# Patient Record
Sex: Female | Born: 1975 | Race: Black or African American | Hispanic: No | Marital: Married | Smoking: Former smoker
Health system: Southern US, Community
[De-identification: ages and names within clinical notes are randomized; demographics above are authoritative.]

## PROBLEM LIST (undated history)

## (undated) DIAGNOSIS — M62838 Other muscle spasm: Secondary | ICD-10-CM

## (undated) DIAGNOSIS — R0600 Dyspnea, unspecified: Secondary | ICD-10-CM

## (undated) DIAGNOSIS — R519 Headache, unspecified: Secondary | ICD-10-CM

## (undated) DIAGNOSIS — F329 Major depressive disorder, single episode, unspecified: Secondary | ICD-10-CM

## (undated) DIAGNOSIS — D649 Anemia, unspecified: Secondary | ICD-10-CM

## (undated) DIAGNOSIS — I10 Essential (primary) hypertension: Secondary | ICD-10-CM

## (undated) DIAGNOSIS — R55 Syncope and collapse: Secondary | ICD-10-CM

## (undated) DIAGNOSIS — F32A Depression, unspecified: Secondary | ICD-10-CM

## (undated) DIAGNOSIS — M1990 Unspecified osteoarthritis, unspecified site: Secondary | ICD-10-CM

## (undated) DIAGNOSIS — F419 Anxiety disorder, unspecified: Secondary | ICD-10-CM

## (undated) HISTORY — PX: CHOLECYSTECTOMY: SHX55

## (undated) HISTORY — PX: BREAST EXCISIONAL BIOPSY: SUR124

## (undated) HISTORY — DX: Anxiety disorder, unspecified: F41.9

## (undated) HISTORY — DX: Other muscle spasm: M62.838

## (undated) HISTORY — PX: TUBAL LIGATION: SHX77

## (undated) HISTORY — DX: Syncope and collapse: R55

## (undated) HISTORY — DX: Major depressive disorder, single episode, unspecified: F32.9

## (undated) HISTORY — DX: Depression, unspecified: F32.A

---

## 2015-03-17 ENCOUNTER — Encounter (HOSPITAL_COMMUNITY): Payer: Self-pay | Admitting: *Deleted

## 2015-03-17 DIAGNOSIS — R002 Palpitations: Secondary | ICD-10-CM | POA: Insufficient documentation

## 2015-03-17 DIAGNOSIS — Z720 Tobacco use: Secondary | ICD-10-CM | POA: Insufficient documentation

## 2015-03-17 DIAGNOSIS — H539 Unspecified visual disturbance: Secondary | ICD-10-CM | POA: Insufficient documentation

## 2015-03-17 DIAGNOSIS — R55 Syncope and collapse: Secondary | ICD-10-CM | POA: Insufficient documentation

## 2015-03-17 DIAGNOSIS — R000 Tachycardia, unspecified: Secondary | ICD-10-CM | POA: Insufficient documentation

## 2015-03-17 DIAGNOSIS — I10 Essential (primary) hypertension: Secondary | ICD-10-CM | POA: Insufficient documentation

## 2015-03-17 DIAGNOSIS — R531 Weakness: Secondary | ICD-10-CM | POA: Insufficient documentation

## 2015-03-17 NOTE — ED Notes (Signed)
lmp last month 

## 2015-03-17 NOTE — ED Notes (Signed)
The pt is c/o being dizzy since 2000 tonight c/o weakness and FALLING OUT.  bp elevated.  On meds years ago none now

## 2015-03-18 ENCOUNTER — Emergency Department (HOSPITAL_COMMUNITY)
Admission: EM | Admit: 2015-03-18 | Discharge: 2015-03-18 | Disposition: A | Discharge status: Home / Self Care | Payer: Self-pay | Attending: Emergency Medicine | Admitting: Emergency Medicine

## 2015-03-18 ENCOUNTER — Encounter (HOSPITAL_COMMUNITY): Payer: Self-pay | Admitting: Emergency Medicine

## 2015-03-18 DIAGNOSIS — R55 Syncope and collapse: Secondary | ICD-10-CM

## 2015-03-18 HISTORY — DX: Essential (primary) hypertension: I10

## 2015-03-18 LAB — LIPASE, BLOOD: LIPASE: 24 U/L (ref 22–51)

## 2015-03-18 LAB — COMPREHENSIVE METABOLIC PANEL
ALT: 15 U/L (ref 14–54)
AST: 16 U/L (ref 15–41)
Albumin: 3.8 g/dL (ref 3.5–5.0)
Alkaline Phosphatase: 45 U/L (ref 38–126)
Anion gap: 12 (ref 5–15)
BILIRUBIN TOTAL: 0.4 mg/dL (ref 0.3–1.2)
BUN: 7 mg/dL (ref 6–20)
CHLORIDE: 103 mmol/L (ref 101–111)
CO2: 22 mmol/L (ref 22–32)
CREATININE: 0.77 mg/dL (ref 0.44–1.00)
Calcium: 9.3 mg/dL (ref 8.9–10.3)
GFR calc Af Amer: >60 mL/min (ref >60)
Glucose, Bld: 116 mg/dL — ABNORMAL HIGH (ref 70–99)
Potassium: 3.8 mmol/L (ref 3.5–5.1)
Sodium: 137 mmol/L (ref 135–145)
Total Protein: 7.4 g/dL (ref 6.5–8.1)

## 2015-03-18 LAB — CBC WITH DIFFERENTIAL/PLATELET
BASOS PCT: 0 % (ref 0–1)
Basophils Absolute: 0 10*3/uL (ref 0.0–0.1)
Eosinophils Absolute: 0.2 10*3/uL (ref 0.0–0.7)
Eosinophils Relative: 3 % (ref 0–5)
HEMATOCRIT: 36.2 % (ref 36.0–46.0)
Hemoglobin: 12 g/dL (ref 12.0–15.0)
LYMPHS PCT: 35 % (ref 12–46)
Lymphs Abs: 3.2 10*3/uL (ref 0.7–4.0)
MCH: 26.8 pg (ref 26.0–34.0)
MCHC: 33.1 g/dL (ref 30.0–36.0)
MCV: 80.8 fL (ref 78.0–100.0)
MONO ABS: 0.7 10*3/uL (ref 0.1–1.0)
MONOS PCT: 8 % (ref 3–12)
Neutro Abs: 4.9 10*3/uL (ref 1.7–7.7)
Neutrophils Relative %: 54 % (ref 43–77)
Platelets: 362 10*3/uL (ref 150–400)
RBC: 4.48 MIL/uL (ref 3.87–5.11)
RDW: 16 % — ABNORMAL HIGH (ref 11.5–15.5)
WBC: 9.1 10*3/uL (ref 4.0–10.5)

## 2015-03-18 LAB — I-STAT CG4 LACTIC ACID, ED: Lactic Acid, Venous: 0.71 mmol/L (ref 0.5–2.0)

## 2015-03-18 LAB — TROPONIN I: Troponin I: <0.03 ng/mL (ref <0.031)

## 2015-03-18 NOTE — ED Notes (Signed)
Explained to patient urine sample is still needed.

## 2015-03-18 NOTE — ED Notes (Signed)
Pt. Unable to void still.

## 2015-03-18 NOTE — ED Provider Notes (Signed)
CSN: [id number]     Arrival date & time 03/17/15  2300 History  This chart was scribed for Dione Boozeavid Jakeb Lamping, MD by Abel PrestoKara Demonbreun, ED Scribe. This patient was seen in room A04C/A04C and the patient's care was started at 1:27 AM.      Chief Complaint  Patient presents with  . Dizziness     Patient is a 39 y.o. female presenting with dizziness. The history is provided by the patient. No language interpreter was used.  Dizziness Associated symptoms: palpitations and weakness   Associated symptoms: no chest pain, no nausea and no shortness of breath    HPI Comments: Morgan SkinnerCristalh Mcnear is a 39 y.o. female with PMHx of HTN who presents to the Emergency Department complaining of near syncopal episode with onset his evening around 9:45 PM. She states she standing in kitchen making a sandwich and began to feel dizzy and her vision blurred. She states "I just fell out."  She notes dizziness resolved around 12:30 AM. Pt states she did not begin to feel at baseline until arrival to ED. Pt notes associated palpitations and weakness in legs. Pt denies nausea, chest pain, diaphoresis, and SOB.   Past Medical History  Diagnosis Date  . Hypertension    Past Surgical History  Procedure Laterality Date  . Cholecystectomy    . Tubal ligation     No family history on file. History  Substance Use Topics  . Smoking status: Current Every Day Smoker  . Smokeless tobacco: Not on file  . Alcohol Use: No   OB History    No data available     Review of Systems  Constitutional: Negative for diaphoresis.  Eyes: Positive for visual disturbance.  Respiratory: Negative for shortness of breath.   Cardiovascular: Positive for palpitations. Negative for chest pain.  Gastrointestinal: Negative for nausea.  Neurological: Positive for dizziness and weakness. Negative for syncope.  All other systems reviewed and are negative.     Allergies  Review of patient's allergies indicates no known allergies.  Home  Medications   Prior to Admission medications   Medication Sig Start Date End Date Taking? Authorizing Provider  ibuprofen (ADVIL,MOTRIN) 100 MG tablet Take 600 mg by mouth every 6 (six) hours as needed for fever.   Yes Historical Provider, MD   BP 112/75 mmHg  Pulse 87  Temp(Src) 98.3 F (36.8 C) (Oral)  Resp 18  SpO2 100%  LMP 03/05/2015 Physical Exam  Constitutional: She is oriented to person, place, and time. She appears well-developed and well-nourished.  HENT:  Head: Normocephalic and atraumatic.  Eyes: Conjunctivae are normal. Pupils are equal, round, and reactive to light.  Neck: Normal range of motion. Neck supple. No JVD present.  Cardiovascular: Normal rate, regular rhythm and normal heart sounds.  Exam reveals no friction rub.   No murmur heard. Pulmonary/Chest: Effort normal and breath sounds normal. She has no wheezes. She has no rales. She exhibits no tenderness.  Abdominal: Soft. Bowel sounds are normal. She exhibits no distension and no mass. There is no tenderness.  Musculoskeletal: Normal range of motion. She exhibits no edema.  Lymphadenopathy:    She has no cervical adenopathy.  Neurological: She is alert and oriented to person, place, and time. No cranial nerve deficit. She exhibits normal muscle tone. Coordination normal.  Skin: Skin is warm and dry. No rash noted.  Psychiatric: She has a normal mood and affect. Her behavior is normal. Judgment and thought content normal.  Nursing note and vitals reviewed.  ED Course  Procedures (including critical care time) DIAGNOSTIC STUDIES: Oxygen Saturation is 100% on room air, normal by my interpretation.    COORDINATION OF CARE: 1:34 AM Discussed treatment plan with patient at beside, the patient agrees with the plan and has no further questions at this time.   Labs Review Results for orders placed or performed during the hospital encounter of 03/18/15  CBC with Differential  Result Value Ref Range   WBC 9.1  4.0 - 10.5 K/uL   RBC 4.48 3.87 - 5.11 MIL/uL   Hemoglobin 12.0 12.0 - 15.0 g/dL   HCT 16.136.2 09.636.0 - 04.546.0 %   MCV 80.8 78.0 - 100.0 fL   MCH 26.8 26.0 - 34.0 pg   MCHC 33.1 30.0 - 36.0 g/dL   RDW 40.916.0 (H) 81.111.5 - 91.415.5 %   Platelets 362 150 - 400 K/uL   Neutrophils Relative % 54 43 - 77 %   Neutro Abs 4.9 1.7 - 7.7 K/uL   Lymphocytes Relative 35 12 - 46 %   Lymphs Abs 3.2 0.7 - 4.0 K/uL   Monocytes Relative 8 3 - 12 %   Monocytes Absolute 0.7 0.1 - 1.0 K/uL   Eosinophils Relative 3 0 - 5 %   Eosinophils Absolute 0.2 0.0 - 0.7 K/uL   Basophils Relative 0 0 - 1 %   Basophils Absolute 0.0 0.0 - 0.1 K/uL  Comprehensive metabolic panel  Result Value Ref Range   Sodium 137 135 - 145 mmol/L   Potassium 3.8 3.5 - 5.1 mmol/L   Chloride 103 101 - 111 mmol/L   CO2 22 22 - 32 mmol/L   Glucose, Bld 116 (H) 70 - 99 mg/dL   BUN 7 6 - 20 mg/dL   Creatinine, Ser 7.820.77 0.44 - 1.00 mg/dL   Calcium 9.3 8.9 - 95.610.3 mg/dL   Total Protein 7.4 6.5 - 8.1 g/dL   Albumin 3.8 3.5 - 5.0 g/dL   AST 16 15 - 41 U/L   ALT 15 14 - 54 U/L   Alkaline Phosphatase 45 38 - 126 U/L   Total Bilirubin 0.4 0.3 - 1.2 mg/dL   GFR calc non Af Amer >60 >60 mL/min   GFR calc Af Amer >60 >60 mL/min   Anion gap 12 5 - 15  Lipase, blood  Result Value Ref Range   Lipase 24 22 - 51 U/L  Troponin I  Result Value Ref Range   Troponin I <0.03 <0.031 ng/mL  I-Stat CG4 Lactic Acid, ED  Result Value Ref Range   Lactic Acid, Venous 0.71 0.5 - 2.0 mmol/L     EKG Interpretation   Date/Time:  Wednesday Mar 17 2015 23:29:31 EDT Ventricular Rate:  108 PR Interval:  158 QRS Duration: 82 QT Interval:  342 QTC Calculation: 458 R Axis:   1 Text Interpretation:  Sinus tachycardia Otherwise normal ECG No old  tracing to compare Confirmed by [facility]  MD, Reeva Davern ([id number]) on 03/18/2015  1:29:50 AM      MDM   Final diagnoses:  Near syncope    Near syncope of uncertain cause. In the ED, her vital signs are normal. Screening labs  are unremarkable and ECG shows only mild sinus tachycardia. Troponin and lactic acid level are normal. As patient's results came back, patient stated that she had to leave because she left her children home. She was advised that I would be getting her discharge report to gather 4, but she left before it could be given.  I personally performed the services described in this documentation, which was scribed in my presence. The recorded information has been reviewed and is accurate.       Dione Booze, MD 03/18/15 [phone]

## 2015-03-18 NOTE — ED Notes (Signed)
Patient ready to leave AMA.

## 2015-03-18 NOTE — ED Notes (Signed)
Informed Dr. Preston FleetingGlick patient is ready to leave. Return to room to prepare for discharge, but patient already walking out of door.

## 2015-03-18 NOTE — Discharge Instructions (Signed)
Near-Syncope Near-syncope (commonly known as near fainting) is sudden weakness, dizziness, or feeling like you might pass out. During an episode of near-syncope, you may also develop pale skin, have tunnel vision, or feel sick to your stomach (nauseous). Near-syncope may occur when getting up after sitting or while standing for a long time. It is caused by a sudden decrease in blood flow to the brain. This decrease can result from various causes or triggers, most of which are not serious. However, because near-syncope can sometimes be a sign of something serious, a medical evaluation is required. The specific cause is often not determined. HOME CARE INSTRUCTIONS  Monitor your condition for any changes. The following actions may help to alleviate any discomfort you are experiencing:  Have someone stay with you until you feel stable.  Lie down right away and prop your feet up if you start feeling like you might faint. Breathe deeply and steadily. Wait until all the symptoms have passed. Most of these episodes last only a few minutes. You may feel tired for several hours.   Drink enough fluids to keep your urine clear or pale yellow.   If you are taking blood pressure or heart medicine, get up slowly when seated or lying down. Take several minutes to sit and then stand. This can reduce dizziness.  Follow up with your health care provider as directed. SEEK IMMEDIATE MEDICAL CARE IF:   You have a severe headache.   You have unusual pain in the chest, abdomen, or back.   You are bleeding from the mouth or rectum, or you have black or tarry stool.   You have an irregular or very fast heartbeat.   You have repeated fainting or have seizure-like jerking during an episode.   You faint when sitting or lying down.   You have confusion.   You have difficulty walking.   You have severe weakness.   You have vision problems.  MAKE SURE YOU:   Understand these instructions.  Will  watch your condition.  Will get help right away if you are not doing well or get worse. Document Released: 10/23/2005 Document Revised: 10/28/2013 Document Reviewed: 03/28/2013 ExitCare Patient Information 2015 [employer]. This information is not intended to replace advice given to you by your health care provider. Make sure you discuss any questions you have with your health care provider.  

## 2016-05-19 ENCOUNTER — Other Ambulatory Visit: Payer: Self-pay | Admitting: Internal Medicine

## 2016-07-31 ENCOUNTER — Encounter (HOSPITAL_COMMUNITY): Payer: Self-pay | Admitting: Emergency Medicine

## 2016-07-31 ENCOUNTER — Emergency Department (HOSPITAL_COMMUNITY)
Admission: EM | Admit: 2016-07-31 | Discharge: 2016-08-01 | Disposition: A | Discharge status: Home / Self Care | Payer: Self-pay | Attending: Dermatology | Admitting: Dermatology

## 2016-07-31 DIAGNOSIS — I10 Essential (primary) hypertension: Secondary | ICD-10-CM | POA: Insufficient documentation

## 2016-07-31 DIAGNOSIS — M79604 Pain in right leg: Secondary | ICD-10-CM | POA: Insufficient documentation

## 2016-07-31 DIAGNOSIS — Z79899 Other long term (current) drug therapy: Secondary | ICD-10-CM | POA: Insufficient documentation

## 2016-07-31 DIAGNOSIS — Z5321 Procedure and treatment not carried out due to patient leaving prior to being seen by health care provider: Secondary | ICD-10-CM | POA: Insufficient documentation

## 2016-07-31 DIAGNOSIS — Z87891 Personal history of nicotine dependence: Secondary | ICD-10-CM | POA: Insufficient documentation

## 2016-07-31 NOTE — ED Triage Notes (Signed)
Pt is c/o right leg pain from her hip down  Pt states her leg is swollen and very painful  Pt denies injury  Pt states it hurts to move her leg or bend her knee  Pt states he has been using ibuprofen and tylenol for the pain but has not gotten any relief today

## 2016-07-31 NOTE — ED Notes (Signed)
Patient called to be put into a room for the provider to see. Patient did not answer. This writer looked in waiting room for patient and did not see them.

## 2016-07-31 NOTE — ED Notes (Signed)
Called  No response noted  Pt not seen in lobby

## 2016-11-17 ENCOUNTER — Encounter: Payer: Self-pay | Admitting: Internal Medicine

## 2016-11-17 ENCOUNTER — Ambulatory Visit (INDEPENDENT_AMBULATORY_CARE_PROVIDER_SITE_OTHER): Payer: Self-pay | Admitting: Internal Medicine

## 2016-11-17 VITALS — HR 90 | Resp 14 | Ht 64.0 in | Wt 307.0 lb

## 2016-11-17 DIAGNOSIS — B349 Viral infection, unspecified: Secondary | ICD-10-CM

## 2016-11-17 MED ORDER — METOPROLOL TARTRATE 25 MG PO TABS: 25.0000 mg | ORAL_TABLET | Freq: Two times a day (BID) | ORAL | 6 refills | Status: DC

## 2016-11-17 NOTE — Progress Notes (Signed)
   Subjective:    Patient ID: Morgan Molina, female    DOB: 06/04/1976, 41 y.o.   MRN: [id number]  HPI   Work in for illness  1.  Started coughing 7 p.m. Tuesday (3 days ago).   The next morning, scratchy throat, body aches, and vomited but went to work.   By the end of the day, chest discomfort, SOB.  Developed fever and chills yesterday as well.   Last episode of vomiting was last night.  Also having diarrhea--watery.   Has not taken temp.   Rotating tylenol and ibuprofen.  Feels better with use.  2.  Essential Hypertension:  Out of Metoprolol and needs refill  3.  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  Periods are regular.  Just finished period a day ago.  Patient has not been seen here in over 1 year.    Current Meds  Medication Sig  . ibuprofen (ADVIL,MOTRIN) 100 MG tablet Take 600 mg by mouth every 6 (six) hours as needed for fever.    No Known Allergies         Review of Systems     Objective:   Physical Exam  NAD, morbidly obese, quite hoarse HEENT:  PERRL, EOMI, TMs; pearly gray, throat without injection, MMM Neck: Supple, No adenopathy Chest:  CTA with good air movement CV:  RRR without murmur or rub, radial and DP pulses normal and equal Abd:  S, NT, No HSM or mass, + BS Skin:  No rash        Assessment & Plan:  1.  Viral Syndrome:  Possibly influenza.  Presentation too late to benefit from Tamiflu. Continue with supportive care, hydration with regular frequent fluid intake Fever/ache control Hold solids until taking liquids regularly for at least 16 hours without vomiting. Call if unable to keep fluids down.  2.  Essential Hypertension:  Refilled Metoprolol.  Encouraged to keep up with care and medications  3.  Prolonged and heavy menstrual bleeding:  To appoint in next month to discuss and evaluate.

## 2016-11-17 NOTE — Patient Instructions (Signed)
Clear liquids for next 24 hours

## 2016-11-24 ENCOUNTER — Ambulatory Visit (INDEPENDENT_AMBULATORY_CARE_PROVIDER_SITE_OTHER): Payer: Self-pay | Admitting: Internal Medicine

## 2016-11-24 ENCOUNTER — Encounter: Payer: Self-pay | Admitting: Internal Medicine

## 2016-11-24 VITALS — BP 140/82 | HR 78 | Temp 98.9°F | Resp 12 | Ht 64.0 in | Wt 302.0 lb

## 2016-11-24 DIAGNOSIS — Z01411 Encounter for gynecological examination (general) (routine) with abnormal findings: Secondary | ICD-10-CM

## 2016-11-24 DIAGNOSIS — N920 Excessive and frequent menstruation with regular cycle: Secondary | ICD-10-CM

## 2016-11-24 DIAGNOSIS — L0232 Furuncle of buttock: Secondary | ICD-10-CM

## 2016-11-24 DIAGNOSIS — R358 Other polyuria: Secondary | ICD-10-CM

## 2016-11-24 DIAGNOSIS — N760 Acute vaginitis: Secondary | ICD-10-CM

## 2016-11-24 DIAGNOSIS — R3589 Other polyuria: Secondary | ICD-10-CM

## 2016-11-24 DIAGNOSIS — B9689 Other specified bacterial agents as the cause of diseases classified elsewhere: Secondary | ICD-10-CM

## 2016-11-24 DIAGNOSIS — R631 Polydipsia: Secondary | ICD-10-CM

## 2016-11-24 DIAGNOSIS — N898 Other specified noninflammatory disorders of vagina: Secondary | ICD-10-CM

## 2016-11-24 LAB — POCT WET PREP WITH KOH
KOH Prep POC: NEGATIVE
RBC WET PREP PER HPF POC: NEGATIVE
Trichomonas, UA: NEGATIVE
Yeast Wet Prep HPF POC: NEGATIVE

## 2016-11-24 MED ORDER — METRONIDAZOLE 500 MG PO TABS: ORAL_TABLET | ORAL | 0 refills | Status: DC

## 2016-11-24 MED ORDER — CEPHALEXIN 500 MG PO CAPS: ORAL_CAPSULE | ORAL | 0 refills | Status: DC

## 2016-11-24 NOTE — Patient Instructions (Signed)
Take the Cephalexin for 5 days first, then follow with the Metronidazole.

## 2016-11-24 NOTE — Progress Notes (Signed)
   Subjective:    Patient ID: Morgan Molina, female    DOB: November 05, 1976, 41 y.o.   MRN: [id number]  HPI   1.  Here for a "boil"  On left lower buttock close to anal/perineal area.  Has noted this for 3 days.  No fever, but has been taking Ibuprofen regularly. Has not been able to visualize. Opened and drained while using the bathroom just about 5 minutes ago.  Pain is somewhat better now.  States drainage was reddish brown.   Did not note an odor.  2.  Problems with periods:  Periods are lasting 7-10 days, heavy with clots.  Lots of cramping.  This has been a problem since October 2017.  Periods remain regular, however.  Prior to this, her periods were shorter, generally lasting 3-5 days and much lighter.  Had some spotting to start with.  Little to know cramping with periods previously. Is having hot flashes and night sweats.  Has noted those since the summer, maybe August.   Mother was in her 5150s when she went through menopause. Has been getting large boils in between periods (2 episodes).   LMP was Jan1 throught 10th.    3.  Viral Syndrome:  Much better--seen for this last week.  4.  Essential Hypertension:  Has not been able to get to pharmacy with snow.  Current Meds  Medication Sig  . metoprolol tartrate (LOPRESSOR) 25 MG tablet Take 1 tablet (25 mg total) by mouth 2 (two) times daily with a meal. (Patient taking differently: Take 25 mg by mouth 2 (two) times daily with a meal. 1 daily)  No Known Allergies    Review of Systems     Objective:   Physical Exam  Morbidly obese Lungs:  CTA CV:  RRR without murmur or rub, radial pulses normal and equal. Abd:  S, NT, No HSM or mass, + BS Pelvic:  Lichenified dry skin on inner thighs bilaterally surrounding genitals.  + odor.  White discharge without underlying inflammation of cervix and vaginal canal.  No CMT.  Uterus is globular and enlarged.  A bit tender.  No adnexal mass or tenderness. 1 cm indurated area about a 2mm opening  draining blood on left upper inner thigh near perineum.  A bit tender and erythematous.  No fluctuance       Assessment & Plan:  1.  Furuncle:  Cephalexin 500 mg twice daily for 5 days, warm pack/soaks.  2.  Bacterial vaginosis:  Metronidazole 500 mg twice daily for 7 days following treatment for furuncle.  3.  Contact dermatitis or tinea from use of Always pads versus other cause.  Check for elevated glucose.  4.  Polydipsia and polyuria:  Asked this during exam with what appears to be possible skin fungal infection above:  Check CMP  5.  Heavy painful periods:  Suspect fibroids.  Would like to get pelvic ultrasound.  Pt. Without insurance currently.  Willing to apply for orange card until insurance kicks in in March.  Fill out paperwork for her to be able to leave work area to change pads.  Check CBC.  She is to bring by orange card when she is signed up so we can refer for ultrasound

## 2016-11-25 LAB — COMPREHENSIVE METABOLIC PANEL
ALBUMIN: 4.2 g/dL (ref 3.5–5.5)
ALT: 10 IU/L (ref 0–32)
AST: 11 IU/L (ref 0–40)
Albumin/Globulin Ratio: 1.6 (ref 1.2–2.2)
Alkaline Phosphatase: 45 IU/L (ref 39–117)
BILIRUBIN TOTAL: 0.3 mg/dL (ref 0.0–1.2)
BUN / CREAT RATIO: 8 — AB (ref 9–23)
BUN: 6 mg/dL (ref 6–24)
CHLORIDE: 100 mmol/L (ref 96–106)
CO2: 27 mmol/L (ref 18–29)
CREATININE: 0.77 mg/dL (ref 0.57–1.00)
Calcium: 9.2 mg/dL (ref 8.7–10.2)
GFR, EST AFRICAN AMERICAN: 112 mL/min/{1.73_m2} (ref >59)
GFR, EST NON AFRICAN AMERICAN: 97 mL/min/{1.73_m2} (ref >59)
Globulin, Total: 2.6 g/dL (ref 1.5–4.5)
Glucose: 86 mg/dL (ref 65–99)
Potassium: 3.6 mmol/L (ref 3.5–5.2)
Sodium: 140 mmol/L (ref 134–144)
TOTAL PROTEIN: 6.8 g/dL (ref 6.0–8.5)

## 2016-11-25 LAB — CBC WITH DIFFERENTIAL/PLATELET
BASOS ABS: 0 10*3/uL (ref 0.0–0.2)
BASOS: 0 %
EOS (ABSOLUTE): 0.1 10*3/uL (ref 0.0–0.4)
Eos: 1 %
HEMOGLOBIN: 10.8 g/dL — AB (ref 11.1–15.9)
Hematocrit: 33.7 % — ABNORMAL LOW (ref 34.0–46.6)
IMMATURE GRANS (ABS): 0 10*3/uL (ref 0.0–0.1)
Immature Granulocytes: 0 %
Lymphocytes Absolute: 3.1 10*3/uL (ref 0.7–3.1)
Lymphs: 31 %
MCH: 26.2 pg — AB (ref 26.6–33.0)
MCHC: 32 g/dL (ref 31.5–35.7)
MCV: 82 fL (ref 79–97)
MONOCYTES: 8 %
Monocytes Absolute: 0.8 10*3/uL (ref 0.1–0.9)
NEUTROS ABS: 5.9 10*3/uL (ref 1.4–7.0)
Neutrophils: 60 %
Platelets: 358 10*3/uL (ref 150–379)
RBC: 4.12 x10E6/uL (ref 3.77–5.28)
RDW: 17.2 % — ABNORMAL HIGH (ref 12.3–15.4)
WBC: 9.9 10*3/uL (ref 3.4–10.8)

## 2016-11-27 NOTE — Progress Notes (Signed)
Spoke with [facility]. Labs added

## 2016-11-28 LAB — IRON AND TIBC
IRON: 47 ug/dL (ref 27–159)
Iron Saturation: 12 % — ABNORMAL LOW (ref 15–55)
TIBC: 391 ug/dL (ref 250–450)
UIBC: 344 ug/dL (ref 131–425)

## 2016-11-28 LAB — VITAMIN B12: VITAMIN B 12: 294 pg/mL (ref 232–1245)

## 2016-11-28 LAB — SPECIMEN STATUS REPORT

## 2016-11-28 NOTE — Progress Notes (Signed)
Left message for patient to call the office

## 2016-11-29 NOTE — Progress Notes (Signed)
Called twice and phone does not ring. Phone goes straight to voicemail. Left message for patient to call office to discuss lab results.

## 2016-11-30 NOTE — Progress Notes (Signed)
Spoke with patient informed lab results. States she will call back to schedule appointment she needs to check her work schedule.

## 2016-12-19 ENCOUNTER — Telehealth: Payer: Self-pay | Admitting: Internal Medicine

## 2016-12-19 NOTE — Telephone Encounter (Signed)
Patient stated that paperwork for Request for [employer] Job Acc[employer]ation is not complete.  Patient will call with areas that need to be completed.  Paperwork forwarded to nurse.

## 2016-12-19 NOTE — Telephone Encounter (Signed)
To Dr. Mulberry  

## 2016-12-29 NOTE — Telephone Encounter (Signed)
Have we received anything more regarding this?

## 2017-01-01 ENCOUNTER — Other Ambulatory Visit (INDEPENDENT_AMBULATORY_CARE_PROVIDER_SITE_OTHER): Payer: Self-pay

## 2017-01-01 DIAGNOSIS — D649 Anemia, unspecified: Secondary | ICD-10-CM

## 2017-01-02 LAB — POCT HEMOGLOBIN: Hemoglobin: 10.5 g/dL — AB (ref 12.2–16.2)

## 2017-01-04 NOTE — Telephone Encounter (Signed)
Left message for patient paperwork ready to be picked up

## 2017-07-29 ENCOUNTER — Emergency Department (HOSPITAL_BASED_OUTPATIENT_CLINIC_OR_DEPARTMENT_OTHER): Admit: 2017-07-29 | Discharge: 2017-07-29 | Disposition: A | Discharge status: Home / Self Care | Payer: 59

## 2017-07-29 ENCOUNTER — Encounter (HOSPITAL_COMMUNITY): Payer: Self-pay

## 2017-07-29 ENCOUNTER — Emergency Department (HOSPITAL_COMMUNITY): Payer: 59

## 2017-07-29 ENCOUNTER — Emergency Department (HOSPITAL_COMMUNITY)
Admission: EM | Admit: 2017-07-29 | Discharge: 2017-07-29 | Disposition: A | Discharge status: Home / Self Care | Payer: 59 | Attending: Emergency Medicine | Admitting: Emergency Medicine

## 2017-07-29 DIAGNOSIS — M79604 Pain in right leg: Secondary | ICD-10-CM

## 2017-07-29 DIAGNOSIS — I10 Essential (primary) hypertension: Secondary | ICD-10-CM | POA: Diagnosis not present

## 2017-07-29 DIAGNOSIS — Z87891 Personal history of nicotine dependence: Secondary | ICD-10-CM | POA: Diagnosis not present

## 2017-07-29 DIAGNOSIS — M79609 Pain in unspecified limb: Secondary | ICD-10-CM

## 2017-07-29 DIAGNOSIS — Z79899 Other long term (current) drug therapy: Secondary | ICD-10-CM | POA: Insufficient documentation

## 2017-07-29 DIAGNOSIS — R200 Anesthesia of skin: Secondary | ICD-10-CM | POA: Diagnosis not present

## 2017-07-29 MED ORDER — MELOXICAM 15 MG PO TABS: 15.0000 mg | ORAL_TABLET | Freq: Every day | ORAL | 0 refills | Status: DC

## 2017-07-29 MED ORDER — TRAMADOL HCL 50 MG PO TABS: 50.0000 mg | ORAL_TABLET | Freq: Four times a day (QID) | ORAL | 0 refills | Status: DC | PRN

## 2017-07-29 MED ORDER — BACLOFEN 10 MG PO TABS: 10.0000 mg | ORAL_TABLET | Freq: Three times a day (TID) | ORAL | 0 refills | Status: DC

## 2017-07-29 MED ORDER — METHYLPREDNISOLONE 4 MG PO TBPK: ORAL_TABLET | ORAL | 0 refills | Status: DC

## 2017-07-29 MED ORDER — IBUPROFEN 800 MG PO TABS
800.0000 mg | ORAL_TABLET | Freq: Once | ORAL | Status: AC
  Administered 2017-07-29: 800 mg via ORAL
  Filled 2017-07-29: qty 1

## 2017-07-29 NOTE — ED Notes (Signed)
Patient transported to X-ray 

## 2017-07-29 NOTE — ED Triage Notes (Signed)
Pt presents for evaluation of R leg pain x 3-4 days. Denies injury to leg. Reports pain radiates down entire leg and calf, reports some tingling. 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 Pt is ambulatory.

## 2017-07-29 NOTE — ED Notes (Signed)
Pt stable, ambulatory, and verbalized understanding of d/c instructions.  Pt taken to ED entrance via wheelchair.

## 2017-07-29 NOTE — Progress Notes (Signed)
Orthopedic Tech Progress Note Patient Details:  Morgan Molina 10-Aug-1976 [id number]  Ortho Devices Type of Ortho Device: Crutches Ortho Device/Splint Location: Bariatric crutches   Saul Fordyce 07/29/2017, 3:53 PM

## 2017-07-29 NOTE — Progress Notes (Signed)
*  PRELIMINARY RESULTS* Vascular Ultrasound Right lower extremity venous duplex has been completed.  Preliminary findings: No evidence of DVT or baker's cyst.    Farrel Demark, RDMS, RVT  07/29/2017, 1:30 PM

## 2017-07-29 NOTE — ED Provider Notes (Signed)
[facility]-EMERGENCY DEPT Provider Note   CSN: [id number] Arrival date & time: 07/29/17  1041     History   Chief Complaint Chief Complaint  Patient presents with  . Leg Pain    HPI Morgan Molina is a 41 y.o. female  He presents emergency Department with chief complaint of right leg pain. Patient states that she was working extra hours this week and has been doing a lot of sitting. She stood up from her chair and felt pain in her right lower calf, knee and mid thigh. It is circumferential. She feels numbness and significant pain. She did thousand grams of Tylenol last night without relief. She is having increasing difficulty ambulating. She denies weakness or numbness in her lower extremity. She states that she is dragging her leg. She seems to have pain with bending her knee. She denies any increased activity, knee injury.she now has associated right hip and low back pain from limping.she was seen at the urgent care earlier and sent to the emergency department for evaluation of potential DVT. HPI  Past Medical History:  Diagnosis Date  . Hypertension     There are no active problems to display for this patient.   Past Surgical History:  Procedure Laterality Date  . CHOLECYSTECTOMY    . TUBAL LIGATION      OB History    No data available       Home Medications    Prior to Admission medications   Medication Sig Start Date End Date Taking? Authorizing Provider  cephALEXin (KEFLEX) 500 MG capsule 1 cap by mouth twice daily for 5 days. 11/24/16   Julieanne Manson, MD  ibuprofen (ADVIL,MOTRIN) 100 MG tablet Take 600 mg by mouth every 6 (six) hours as needed for fever.    [provider]  metoprolol tartrate (LOPRESSOR) 25 MG tablet Take 1 tablet (25 mg total) by mouth 2 (two) times daily with a meal. Patient taking differently: Take 25 mg by mouth 2 (two) times daily with a meal. 1 daily 11/17/16   Julieanne Manson, MD  metroNIDAZOLE (FLAGYL) 500 MG tablet 1 tab by  mouth twice daily for 7 days. 11/24/16   Julieanne Manson, MD    Family History Family History  Problem Relation Age of Onset  . Cancer Mother   . Hypertension Mother   . Heart attack Mother   . CAD Father   . Hypertension Father   . Diabetes Father     Social History Social History  Substance Use Topics  . Smoking status: Former Games developer  . Smokeless tobacco: Never Used  . Alcohol use No     Allergies   Patient has no known allergies.   Review of Systems Review of Systems Ten systems reviewed and are negative for acute change, except as noted in the HPI.    Physical Exam Updated Vital Signs BP (!) 161/99 (BP Location: Left Arm)   Pulse 81   Temp 98.5 F (36.9 C) (Oral)   Resp 14   Ht  (1.626 m)   Wt (!) 139.3 kg (307 lb)   LMP 07/26/2017 (Exact Date)   SpO2 100%   BMI 52.70 kg/m   Physical Exam  Constitutional: She is oriented to person, place, and time. She appears well-developed and well-nourished. No distress.  HENT:  Head: Normocephalic and atraumatic.  Eyes: Conjunctivae are normal. No scleral icterus.  Neck: Normal range of motion.  Cardiovascular: Normal rate, regular rhythm and normal heart sounds.  Exam reveals no gallop  and no friction rub.   No murmur heard. Pulmonary/Chest: Effort normal and breath sounds normal. No respiratory distress.  Abdominal: Soft. Bowel sounds are normal. She exhibits no distension and no mass. There is no tenderness. There is no guarding.  Musculoskeletal:  Exquisite tenderness to palpation of the knee. Pain with passive range of motion. Normal pulses in bilateral lower extremities, normal strength with plantar and dorsiflexion bilaterally. No swelling noted however exam is limited secondary to body habitus.  Neurological: She is alert and oriented to person, place, and time.  Skin: Skin is warm and dry. She is not diaphoretic.  Psychiatric: Her behavior is normal.  Nursing note and vitals reviewed.    ED  Treatments / Results  Labs (all labs ordered are listed, but only abnormal results are displayed) Labs Reviewed - No data to display  EKG  EKG Interpretation None       Radiology No results found.  Procedures Procedures (including critical care time)  Medications Ordered in ED Medications - No data to display   Initial Impression / Assessment and Plan / ED Course  I have reviewed the triage vital signs and the nursing notes.  Pertinent labs & imaging results that were available during my care of the patient were reviewed by me and considered in my medical decision making (see chart for details).    Patient X-Ray negative for obvious fracture or dislocation. No evidence of DVT. ? Radiculopathy. Pain managed in ED. Pt advised to follow up with orthopedics if symptoms persist for possibility of missed fracture diagnosis. Patient given brace while in ED, conservative therapy recommended and discussed. Patient will be dc home & is agreeable with above plan.   Final Clinical Impressions(s) / ED Diagnoses   Final diagnoses:  Right leg pain    New Prescriptions New Prescriptions   No medications on file     Arthor Captain, PA-C 08/01/17 1621    Melene Plan, DO 08/01/17 [phone]

## 2017-07-29 NOTE — Discharge Instructions (Signed)
There were no acute findings on your examination today. There is a potential that this is coming from a disc in your back. I'm treating you with anti-inflammatories and a Medrol Dosepak which should reduce any inflammation or swelling in the disc. He has a potential to help with the discomfort and having in her legs. If your symptoms are worsening or are not resolving is imperative that he follow up with an orthopedic physician. SEEK IMMEDIATE MEDICAL ATTENTION IF: New numbness, tingling, weakness, or problem with the use of your arms or legs.  Severe back pain not relieved with medications.  Change in bowel or bladder control.  Increasing pain in any areas of the body (such as chest or abdominal pain).  Shortness of breath, dizziness or fainting.  Nausea (feeling sick to your stomach), vomiting, fever, or sweats.

## 2017-07-29 NOTE — ED Notes (Signed)
Computer not working in pt room.  Unable to obtain pt signature.

## 2018-02-08 ENCOUNTER — Encounter (HOSPITAL_COMMUNITY): Payer: Self-pay | Admitting: Family Medicine

## 2018-02-08 ENCOUNTER — Ambulatory Visit (HOSPITAL_COMMUNITY)
Admission: EM | Admit: 2018-02-08 | Discharge: 2018-02-08 | Disposition: A | Discharge status: Home / Self Care | Payer: 59 | Attending: Internal Medicine | Admitting: Internal Medicine

## 2018-02-08 DIAGNOSIS — R0981 Nasal congestion: Secondary | ICD-10-CM

## 2018-02-08 DIAGNOSIS — H6691 Otitis media, unspecified, right ear: Secondary | ICD-10-CM

## 2018-02-08 MED ORDER — PREDNISONE 50 MG PO TABS: 50.0000 mg | ORAL_TABLET | Freq: Every day | ORAL | 0 refills | Status: DC

## 2018-02-08 MED ORDER — AMOXICILLIN-POT CLAVULANATE 875-125 MG PO TABS: 1.0000 | ORAL_TABLET | Freq: Two times a day (BID) | ORAL | 0 refills | Status: AC

## 2018-02-08 MED ORDER — TRIAMCINOLONE ACETONIDE 55 MCG/ACT NA AERO: 2.0000 | INHALATION_SPRAY | Freq: Every day | NASAL | 0 refills | Status: DC

## 2018-02-08 NOTE — Discharge Instructions (Addendum)
Anticipate gradual improvement in hearing over the next several days.  Prescriptions for amoxicillin/clavulanate (antibiotic), prednisone (steroid, for congestion) and triamcinolone nasal steroid spray (for congestion) were sent to the pharmacy.  Recheck for new fever >100.5, increasing phlegm production/nasal discharge, or if not starting to improve in a few days.

## 2018-02-08 NOTE — ED Provider Notes (Signed)
[facility]    CSN: [id number] Arrival date & time: 02/08/18  1136     History   Chief Complaint Chief Complaint  Patient presents with  . Ear Fullness    HPI Morgan Molina is a 42 y.o. female.   She presents today with loss of hearing in the right ear.  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  She had a cold at the beginning of March, feeling better, but now cannot hear.  Some sinus pressure.  No fever.  Little bit of cough.    HPI  Past Medical History:  Diagnosis Date  . Hypertension     Past Surgical History:  Procedure Laterality Date  . CHOLECYSTECTOMY    . TUBAL LIGATION      Home Medications    Prior to Admission medications   Medication Sig Start Date End Date Taking? Authorizing Provider  amoxicillin-clavulanate (AUGMENTIN) 875-125 MG tablet Take 1 tablet by mouth 2 (two) times daily for 7 days. 02/08/18 02/15/18  Isa Rankin, MD  ibuprofen (ADVIL,MOTRIN) 100 MG tablet Take 600 mg by mouth every 6 (six) hours as needed for fever.    [provider]  meloxicam (MOBIC) 15 MG tablet Take 1 tablet (15 mg total) by mouth daily. Take 1 daily with food. 07/29/17   Arthor Captain, PA-C  metoprolol tartrate (LOPRESSOR) 25 MG tablet Take 1 tablet (25 mg total) by mouth 2 (two) times daily with a meal. Patient taking differently: Take 25 mg by mouth 2 (two) times daily with a meal. 1 daily 11/17/16   Julieanne Manson, MD  predniSONE (DELTASONE) 50 MG tablet Take 1 tablet (50 mg total) by mouth daily. 02/08/18   Isa Rankin, MD  triamcinolone (NASACORT) 55 MCG/ACT AERO nasal inhaler Place 2 sprays into the nose daily. 02/08/18   Isa Rankin, MD    Family History Family History  Problem Relation Age of Onset  . Cancer Mother   . Hypertension Mother   . Heart attack Mother   . CAD Father   . Hypertension Father   . Diabetes Father     Social History Social History   Tobacco Use  . Smoking status:  Former Games developer  . Smokeless tobacco: Never Used  Substance Use Topics  . Alcohol use: No  . Drug use: No     Allergies   Patient has no known allergies.   Review of Systems Review of Systems  All other systems reviewed and are negative.    Physical Exam Triage Vital Signs ED Triage Vitals  Enc Vitals Group     BP 02/08/18 1314 (!) 160/94     Pulse Rate 02/08/18 1314 78     Resp 02/08/18 1314 18     Temp 02/08/18 1314 98.2 F (36.8 C)     Temp src --      SpO2 02/08/18 1314 100 %     Weight --      Height --      Pain Score 02/08/18 1312 3     Pain Loc --    Updated Vital Signs BP (!) 160/94   Pulse 78   Temp 98.2 F (36.8 C)   Resp 18   LMP 02/08/2018   SpO2 100%  Physical Exam  Constitutional: She is oriented to person, place, and time. No distress.  Voice sounds congested  HENT:  Head: Atraumatic.  Bilateral TMs are opaque, the right TM is red Marked nasal congestion  bilaterally Difficult to visualize the posterior pharynx  Eyes:  Conjugate gaze observed, no eye redness/discharge  Neck: Neck supple.  Cardiovascular: Normal rate.  Pulmonary/Chest: No respiratory distress.  Abdominal: She exhibits no distension.  Musculoskeletal: Normal range of motion.  Neurological: She is alert and oriented to person, place, and time.  Skin: Skin is warm and dry.  Nursing note and vitals reviewed.    UC Treatments / Results   EKG None Radiology No results found.  Procedures Procedures (including critical care time) None today  Final Clinical Impressions(s) / UC Diagnoses   Final diagnoses:  Acute right otitis media  Sinus congestion   Anticipate gradual improvement in hearing over the next several days.  Prescriptions for amoxicillin/clavulanate (antibiotic), prednisone (steroid, for congestion) and triamcinolone nasal steroid spray (for congestion) were sent to the pharmacy.  Recheck for new fever >100.5, increasing phlegm production/nasal discharge,  or if not starting to improve in a few days.     ED Discharge Orders        Ordered    predniSONE (DELTASONE) 50 MG tablet  Daily     02/08/18 1403    amoxicillin-clavulanate (AUGMENTIN) 875-125 MG tablet  2 times daily     02/08/18 1403    triamcinolone (NASACORT) 55 MCG/ACT AERO nasal inhaler  Daily     02/08/18 1405        Isa RankinMurray, Cecilia Nishikawa Wilson, MD 02/10/18 2036

## 2018-02-08 NOTE — ED Triage Notes (Signed)
Pt here for fullness in the right ear and trouble hearing. Slight pain but she is taking ibuprofen.

## 2018-04-12 ENCOUNTER — Other Ambulatory Visit: Payer: Self-pay | Admitting: Family Medicine

## 2018-04-12 DIAGNOSIS — Z1231 Encounter for screening mammogram for malignant neoplasm of breast: Secondary | ICD-10-CM

## 2018-04-16 ENCOUNTER — Ambulatory Visit: Payer: 59

## 2018-05-10 ENCOUNTER — Ambulatory Visit
Admission: RE | Admit: 2018-05-10 | Discharge: 2018-05-10 | Disposition: A | Discharge status: Home / Self Care | Payer: 59 | Source: Ambulatory Visit | Attending: Family Medicine | Admitting: Family Medicine

## 2018-05-10 ENCOUNTER — Other Ambulatory Visit: Payer: Self-pay | Admitting: Family Medicine

## 2018-05-10 DIAGNOSIS — Z1231 Encounter for screening mammogram for malignant neoplasm of breast: Secondary | ICD-10-CM

## 2018-05-10 DIAGNOSIS — N6452 Nipple discharge: Secondary | ICD-10-CM

## 2018-05-10 DIAGNOSIS — N644 Mastodynia: Secondary | ICD-10-CM

## 2018-05-17 ENCOUNTER — Ambulatory Visit
Admission: RE | Admit: 2018-05-17 | Discharge: 2018-05-17 | Disposition: A | Discharge status: Home / Self Care | Payer: 59 | Source: Ambulatory Visit | Attending: Family Medicine | Admitting: Family Medicine

## 2018-05-17 ENCOUNTER — Other Ambulatory Visit: Payer: Self-pay | Admitting: Family Medicine

## 2018-05-17 DIAGNOSIS — N6452 Nipple discharge: Secondary | ICD-10-CM

## 2018-05-17 DIAGNOSIS — N644 Mastodynia: Secondary | ICD-10-CM

## 2018-05-17 DIAGNOSIS — N6320 Unspecified lump in the left breast, unspecified quadrant: Secondary | ICD-10-CM

## 2018-05-21 ENCOUNTER — Other Ambulatory Visit: Payer: Self-pay | Admitting: Family Medicine

## 2018-05-21 ENCOUNTER — Ambulatory Visit
Admission: RE | Admit: 2018-05-21 | Discharge: 2018-05-21 | Disposition: A | Discharge status: Home / Self Care | Payer: 59 | Source: Ambulatory Visit | Attending: Family Medicine | Admitting: Family Medicine

## 2018-05-21 DIAGNOSIS — N6310 Unspecified lump in the right breast, unspecified quadrant: Secondary | ICD-10-CM

## 2018-05-21 DIAGNOSIS — N6320 Unspecified lump in the left breast, unspecified quadrant: Secondary | ICD-10-CM

## 2018-06-05 ENCOUNTER — Other Ambulatory Visit: Payer: Self-pay | Admitting: Surgery

## 2018-06-13 ENCOUNTER — Other Ambulatory Visit: Payer: Self-pay | Admitting: Surgery

## 2018-06-13 DIAGNOSIS — N6452 Nipple discharge: Secondary | ICD-10-CM

## 2018-06-21 ENCOUNTER — Ambulatory Visit: Payer: Self-pay | Admitting: Internal Medicine

## 2018-07-14 ENCOUNTER — Inpatient Hospital Stay
Admission: RE | Admit: 2018-07-14 | Discharge: 2018-07-14 | Disposition: A | Discharge status: Home / Self Care | Payer: 59 | Source: Ambulatory Visit | Attending: Surgery | Admitting: Surgery

## 2018-11-11 ENCOUNTER — Encounter: Payer: Self-pay | Admitting: Internal Medicine

## 2018-11-11 ENCOUNTER — Ambulatory Visit: Payer: 59 | Admitting: Internal Medicine

## 2018-11-11 VITALS — BP 158/98 | HR 70 | Resp 12 | Ht 64.0 in | Wt 308.0 lb

## 2018-11-11 DIAGNOSIS — F419 Anxiety disorder, unspecified: Secondary | ICD-10-CM | POA: Diagnosis not present

## 2018-11-11 DIAGNOSIS — I10 Essential (primary) hypertension: Secondary | ICD-10-CM | POA: Diagnosis not present

## 2018-11-11 DIAGNOSIS — M25562 Pain in left knee: Secondary | ICD-10-CM

## 2018-11-11 DIAGNOSIS — G8929 Other chronic pain: Secondary | ICD-10-CM

## 2018-11-11 DIAGNOSIS — F410 Panic disorder [episodic paroxysmal anxiety] without agoraphobia: Secondary | ICD-10-CM

## 2018-11-11 DIAGNOSIS — F329 Major depressive disorder, single episode, unspecified: Secondary | ICD-10-CM

## 2018-11-11 DIAGNOSIS — N6310 Unspecified lump in the right breast, unspecified quadrant: Secondary | ICD-10-CM

## 2018-11-11 MED ORDER — SERTRALINE HCL 50 MG PO TABS: ORAL_TABLET | ORAL | 3 refills | Status: DC

## 2018-11-11 MED ORDER — IRBESARTAN-HYDROCHLOROTHIAZIDE 150-12.5 MG PO TABS: 1.0000 | ORAL_TABLET | Freq: Every day | ORAL | 11 refills | Status: DC

## 2018-11-11 NOTE — Patient Instructions (Signed)
Warm bath and reading before bed. If unable to get to sleep in 20 minutes, get out of bed, go somewhere calm and read until you are sleepy, then back to bed. If awaken and unable to get back to sleep in 20 minutes--read as above until sleepy. Physical activity with a friend every day--gradually work up to 1 hour.  Do not exercise close to bedtime. No napping  Call [facility] when you find time to get started with counseling

## 2018-11-11 NOTE — Progress Notes (Signed)
   Subjective:    Patient ID: Morgan Molina, female    DOB: 1975-12-22, 43 y.o.   MRN: [id number]  HPI  Re establishing  1.  Hypertension:  Off meds since probably September.  Struggling with working through her grief with loss of son several years ago.  2.  Depression:  Breaking point 3 days go.  Lost 70 yo son 11 years ago and never really grieved.  Now that her youngest child is off at college, she has had significant difficulties with depression.  Had to drop out of work over the summer of 2019 and get counseling with [employer].  Was helping, but has not been back since August.  Was overwhelmed with right breast mass that required biopsy and did not keep that procedure visit. Poor sleep.   She is having Panic attacks with palpitations, dyspnea and shaking and has to pull over 4 times in her 3 hour drive to her mom's home.  She has not really been suicidal, but states when she is really having difficulties, could strangle someone else.   3.  Left knee pain without injury for 3 months.  Did have a deep bruise on medial side of knee she could not remember caused by an injury.   4.  Retroareolar right breast mass and mother with history of breast cancer at age 43.  Mom still living.  She was not at a place to mentally deal with it when set up in July of 2019 when set up with a biopsy through [facility]  Current Meds  Medication Sig  . ibuprofen (ADVIL,MOTRIN) 100 MG tablet Take 600 mg by mouth every 6 (six) hours as needed for fever.    No Known Allergies  Review of Systems     Objective:   Physical Exam  Tearful at times Lungs:  CTA CV:  RRR with normal S1 and S2, No S3, S4 or murmur.  Radial pulses normal and equal LE:  Mild to moderate pitting edema bilaterally  Depression screen PHQ 2/9 11/11/2018  Decreased Interest 3  Down, Depressed, Hopeless 3  PHQ - 2 Score 6  Altered sleeping 3  Tired, decreased energy 3  Change in appetite 3  Feeling bad or  failure about yourself  3  Trouble concentrating 0  Moving slowly or fidgety/restless 3  Suicidal thoughts 1  PHQ-9 Score 22  Difficult doing work/chores Extremely dIfficult          Assessment & Plan:  1.  Depression, anxiety, panic disorder and later, describes sitting in a dark room for hours at a time and not wanting to leave, likely agoraphobia to a certain degree. Start Sertraline 25 mg daily with increase if tolerating well to 50 mg in 8 days. Follow up with me in 1 week To call if suicidal or homicidal ideation. Very anxious about starting med  2.  Hypertension:  Stopped Losartan/HCTZ due to recall, but worked well for her.  Start Irbesartan/HCTZ.  BMP when returns in 1 week.  3.  Right breast mass:  Do not want to overwhelm her.  Agreed to address this at 1 week follow up if she is doing better along with #4  4.  Left Knee pain:  As above.

## 2018-11-19 ENCOUNTER — Ambulatory Visit: Payer: 59 | Admitting: Internal Medicine

## 2018-11-21 ENCOUNTER — Ambulatory Visit: Payer: 59 | Admitting: Internal Medicine

## 2018-11-21 ENCOUNTER — Encounter: Payer: Self-pay | Admitting: Internal Medicine

## 2018-11-21 VITALS — BP 138/82 | HR 70 | Resp 14 | Ht 64.0 in | Wt 308.0 lb

## 2018-11-21 DIAGNOSIS — N6310 Unspecified lump in the right breast, unspecified quadrant: Secondary | ICD-10-CM

## 2018-11-21 DIAGNOSIS — I10 Essential (primary) hypertension: Secondary | ICD-10-CM

## 2018-11-21 DIAGNOSIS — L03032 Cellulitis of left toe: Secondary | ICD-10-CM

## 2018-11-21 DIAGNOSIS — B351 Tinea unguium: Secondary | ICD-10-CM

## 2018-11-21 DIAGNOSIS — F419 Anxiety disorder, unspecified: Secondary | ICD-10-CM

## 2018-11-21 DIAGNOSIS — F410 Panic disorder [episodic paroxysmal anxiety] without agoraphobia: Secondary | ICD-10-CM | POA: Diagnosis not present

## 2018-11-21 DIAGNOSIS — F329 Major depressive disorder, single episode, unspecified: Secondary | ICD-10-CM

## 2018-11-21 DIAGNOSIS — Z79899 Other long term (current) drug therapy: Secondary | ICD-10-CM

## 2018-11-21 DIAGNOSIS — B353 Tinea pedis: Secondary | ICD-10-CM

## 2018-11-21 MED ORDER — CITALOPRAM HYDROBROMIDE 10 MG PO TABS: 10.0000 mg | ORAL_TABLET | Freq: Every day | ORAL | 2 refills | Status: DC

## 2018-11-21 MED ORDER — CEPHALEXIN 250 MG PO CAPS: 250.0000 mg | ORAL_CAPSULE | Freq: Four times a day (QID) | ORAL | 0 refills | Status: DC

## 2018-11-21 NOTE — Progress Notes (Deleted)
Left soon after roomed as had another appt to go to.

## 2018-11-21 NOTE — Patient Instructions (Signed)
Terbinafine cream twice daily to in between toes for 14 days.

## 2018-11-21 NOTE — Progress Notes (Signed)
   Subjective:   Patient ID: Morgan Molina, female    DOB: Dec 27, 1975, 43 y.o.   MRN: [id number]  HPI   1.  Depression:  Took Sertraline 25 mg for 6-7 days at about 7 am.  Noted increasing irritability with customers.  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   She did not have empathy for her customers and did not care.   Sleep still a problem   2.  Hypertension and edema:  Much improved with swelling on the Irbesartan/HCTZ, but now having muscle cramps.  Due for BMP today.  Has had same problem with Losartan/HCTZ in past.  Is eating an orange every other day.  Does not like bananas.  3.  Edema:  As above.  4.  Left toe cut corn at base 5 days ago and in between 5th and 4th toes and now swelling and painful.  She has been to podiatry who recommended surgery for bone causing the corn there.  She has been avoiding.    Current Meds  Medication Sig  . irbesartan-hydrochlorothiazide (AVALIDE) 150-12.5 MG tablet Take 1 tablet by mouth daily.    Allergies  Allergen Reactions  . Citalopram Other (See Comments)    Pt fell out  . Lisinopril Cough    ACE I cough    Review of Systems    Objective:   Physical Exam  NAD Lungs:  CTA CV:  RRR  LE:  Mild edema Feet:  Toenail thickening and discoloration with redness/fissuring in between toes.  Area between 4th and 5th toes on left where corn cut away with mild surrounding erythema.     Assessment & Plan:  1.  Depression/anxiety/panic disorder/?PTSD:  Switch to Citalopram 10 mg daily and follow up in 1 week.  2.  Right Breast Mass:  Patient not ready to address going back for biopsy.  Causes increased anxiety.  If can get her anxiety under control, next goal will be for her to get back to surgery for this. Send for records from [facility] and Dr. Magnus Ivan as to whether needs surgery or not. Discuss again at next visit  3.  Hypertension and edema:  Improved.  Check BMP today  4.  Cellulitis of left toe:  Cephalexin  250 mg 4 times daily for 7 days.  5.  Toenail onychomycosis and tinea pedis:  Not interested in treatment with Terbinafine at this time.

## 2018-11-22 LAB — BASIC METABOLIC PANEL
BUN/Creatinine Ratio: 11 (ref 9–23)
BUN: 9 mg/dL (ref 6–24)
CHLORIDE: 102 mmol/L (ref 96–106)
CO2: 23 mmol/L (ref 20–29)
Calcium: 9.5 mg/dL (ref 8.7–10.2)
Creatinine, Ser: 0.82 mg/dL (ref 0.57–1.00)
GFR calc Af Amer: 102 mL/min/{1.73_m2} (ref >59)
GFR calc non Af Amer: 89 mL/min/{1.73_m2} (ref >59)
Glucose: 78 mg/dL (ref 65–99)
Potassium: 4.1 mmol/L (ref 3.5–5.2)
Sodium: 141 mmol/L (ref 134–144)

## 2018-11-28 ENCOUNTER — Ambulatory Visit: Payer: 59 | Admitting: Internal Medicine

## 2018-12-31 ENCOUNTER — Ambulatory Visit (HOSPITAL_COMMUNITY)
Admission: EM | Admit: 2018-12-31 | Discharge: 2018-12-31 | Discharge status: Against Medical Advice | Payer: 59 | Source: Home / Self Care

## 2018-12-31 ENCOUNTER — Encounter (HOSPITAL_COMMUNITY): Payer: Self-pay

## 2018-12-31 ENCOUNTER — Emergency Department (HOSPITAL_COMMUNITY)
Admission: EM | Admit: 2018-12-31 | Discharge: 2018-12-31 | Disposition: A | Discharge status: Home / Self Care | Payer: 59 | Attending: Emergency Medicine | Admitting: Emergency Medicine

## 2018-12-31 ENCOUNTER — Emergency Department (HOSPITAL_COMMUNITY): Payer: 59

## 2018-12-31 DIAGNOSIS — J0110 Acute frontal sinusitis, unspecified: Secondary | ICD-10-CM | POA: Diagnosis not present

## 2018-12-31 DIAGNOSIS — Z79899 Other long term (current) drug therapy: Secondary | ICD-10-CM | POA: Insufficient documentation

## 2018-12-31 DIAGNOSIS — I10 Essential (primary) hypertension: Secondary | ICD-10-CM | POA: Diagnosis not present

## 2018-12-31 DIAGNOSIS — F17210 Nicotine dependence, cigarettes, uncomplicated: Secondary | ICD-10-CM | POA: Diagnosis not present

## 2018-12-31 DIAGNOSIS — R42 Dizziness and giddiness: Secondary | ICD-10-CM | POA: Insufficient documentation

## 2018-12-31 LAB — CBC
HCT: 32.7 % — ABNORMAL LOW (ref 36.0–46.0)
Hemoglobin: 10.2 g/dL — ABNORMAL LOW (ref 12.0–15.0)
MCH: 25.7 pg — ABNORMAL LOW (ref 26.0–34.0)
MCHC: 31.2 g/dL (ref 30.0–36.0)
MCV: 82.4 fL (ref 80.0–100.0)
PLATELETS: 372 10*3/uL (ref 150–400)
RBC: 3.97 MIL/uL (ref 3.87–5.11)
RDW: 18.1 % — AB (ref 11.5–15.5)
WBC: 6.7 10*3/uL (ref 4.0–10.5)
nRBC: 0 % (ref 0.0–0.2)

## 2018-12-31 LAB — URINALYSIS, ROUTINE W REFLEX MICROSCOPIC
Bilirubin Urine: NEGATIVE
Glucose, UA: NEGATIVE mg/dL
Hgb urine dipstick: NEGATIVE
Ketones, ur: NEGATIVE mg/dL
Leukocytes,Ua: NEGATIVE
Nitrite: NEGATIVE
Protein, ur: NEGATIVE mg/dL
Specific Gravity, Urine: 1.018 (ref 1.005–1.030)
pH: 5 (ref 5.0–8.0)

## 2018-12-31 LAB — BASIC METABOLIC PANEL
Anion gap: 4 — ABNORMAL LOW (ref 5–15)
BUN: <5 mg/dL — ABNORMAL LOW (ref 6–20)
CO2: 27 mmol/L (ref 22–32)
CREATININE: 0.79 mg/dL (ref 0.44–1.00)
Calcium: 8.9 mg/dL (ref 8.9–10.3)
Chloride: 107 mmol/L (ref 98–111)
GFR calc Af Amer: >60 mL/min (ref >60)
GFR calc non Af Amer: >60 mL/min (ref >60)
Glucose, Bld: 92 mg/dL (ref 70–99)
Potassium: 3.8 mmol/L (ref 3.5–5.1)
Sodium: 138 mmol/L (ref 135–145)

## 2018-12-31 LAB — I-STAT BETA HCG BLOOD, ED (MC, WL, AP ONLY): I-stat hCG, quantitative: <5 m[IU]/mL (ref <5)

## 2018-12-31 LAB — CBG MONITORING, ED: GLUCOSE-CAPILLARY: 76 mg/dL (ref 70–99)

## 2018-12-31 MED ORDER — MECLIZINE HCL 25 MG PO TABS: 25.0000 mg | ORAL_TABLET | Freq: Three times a day (TID) | ORAL | 0 refills | Status: DC | PRN

## 2018-12-31 MED ORDER — DIPHENHYDRAMINE HCL 50 MG/ML IJ SOLN
25.0000 mg | Freq: Once | INTRAMUSCULAR | Status: AC
  Administered 2018-12-31: 25 mg via INTRAVENOUS
  Filled 2018-12-31: qty 1

## 2018-12-31 MED ORDER — PROCHLORPERAZINE EDISYLATE 10 MG/2ML IJ SOLN
10.0000 mg | Freq: Once | INTRAMUSCULAR | Status: AC
  Administered 2018-12-31: 10 mg via INTRAVENOUS
  Filled 2018-12-31: qty 2

## 2018-12-31 MED ORDER — AMOXICILLIN-POT CLAVULANATE 875-125 MG PO TABS: 1.0000 | ORAL_TABLET | Freq: Two times a day (BID) | ORAL | 0 refills | Status: DC

## 2018-12-31 MED ORDER — SODIUM CHLORIDE 0.9 % IV BOLUS
1000.0000 mL | Freq: Once | INTRAVENOUS | Status: AC
  Administered 2018-12-31: 1000 mL via INTRAVENOUS

## 2018-12-31 MED ORDER — DEXAMETHASONE 4 MG PO TABS
10.0000 mg | ORAL_TABLET | Freq: Once | ORAL | Status: AC
  Administered 2018-12-31: 10 mg via ORAL
  Filled 2018-12-31: qty 3

## 2018-12-31 MED ORDER — SODIUM CHLORIDE 0.9% FLUSH
3.0000 mL | Freq: Once | INTRAVENOUS | Status: AC
  Administered 2018-12-31: 3 mL via INTRAVENOUS

## 2018-12-31 NOTE — ED Provider Notes (Signed)
[facility] EMERGENCY DEPARTMENT Provider Note   CSN: [id number] Arrival date & time: 12/31/18  1345    History   Chief Complaint Chief Complaint  Patient presents with  . Dizziness    HPI Morgan Molina is a 43 y.o. female.     43 yo F with a chief complaint of recurrent syncopal events and dizziness.  This been going on for the past couple weeks.  Patient had an event where she went out drinking and suddenly felt very warm and nauseated went to the bathroom and then collapsed to the ground.  She woke up feeling dizzy, describes this as unsteadiness on her feet.  Has had that issue off and on most commonly when she is driving.  She has been able to go to work with this and has been able to walk independently.  She had a second syncopal event while at work she was sitting at her desk and suddenly felt like she may vomit and felt warm and got up to go to the bathroom and collapsed to the ground.  She was unable to get seen immediately by her family doctor and so came to the emergency department.  She denies cough congestion or fever denies abdominal pain vomiting or diarrhea.  She has a mild headache.  Has had some photophobia.  Denies neck pain.  Denies unilateral numbness or weakness denies difficulty with speech or swallowing.  Has a history of hypertension.  She also has a history of anxiety and was started on Celexa just prior to her initial syncopal event.  She has taken herself off of that medication over the past few days without improvement of her symptoms.  The history is provided by the patient.  Dizziness  Quality:  Imbalance Severity:  Moderate Onset quality:  Gradual Duration:  2 weeks Timing:  Constant Progression:  Worsening Chronicity:  New Context: physical activity   Relieved by:  Nothing Worsened by:  Nothing Ineffective treatments:  None tried Associated symptoms: no blood in stool, no chest pain, no headaches, no nausea, no palpitations, no  shortness of breath and no vomiting     Past Medical History:  Diagnosis Date  . Hypertension     There are no active problems to display for this patient.   Past Surgical History:  Procedure Laterality Date  . BREAST EXCISIONAL BIOPSY Right   . CHOLECYSTECTOMY    . TUBAL LIGATION       OB History   No obstetric history on file.      Home Medications    Prior to Admission medications   Medication Sig Start Date End Date Taking? Authorizing Provider  citalopram (CELEXA) 10 MG tablet Take 1 tablet (10 mg total) by mouth daily. 11/21/18  Yes Julieanne Manson, MD  irbesartan-hydrochlorothiazide (AVALIDE) 150-12.5 MG tablet Take 1 tablet by mouth daily. 11/11/18  Yes Julieanne Manson, MD  amoxicillin-clavulanate (AUGMENTIN) 875-125 MG tablet Take 1 tablet by mouth 2 (two) times daily. 12/31/18   Melene Plan, DO  meclizine (ANTIVERT) 25 MG tablet Take 1 tablet (25 mg total) by mouth 3 (three) times daily as needed for dizziness. 12/31/18   Melene Plan, DO    Family History Family History  Problem Relation Age of Onset  . Cancer Mother   . Hypertension Mother   . Heart attack Mother   . Breast cancer Mother 22  . CAD Father   . Hypertension Father   . Diabetes Father     Social History Social  History   Tobacco Use  . Smoking status: Current Every Day Smoker  . Smokeless tobacco: Never Used  Substance Use Topics  . Alcohol use: No  . Drug use: No     Allergies   Citalopram and Lisinopril   Review of Systems Review of Systems  Constitutional: Negative for chills and fever.  HENT: Negative for congestion and rhinorrhea.   Eyes: Positive for photophobia. Negative for redness and visual disturbance.  Respiratory: Negative for shortness of breath and wheezing.   Cardiovascular: Negative for chest pain and palpitations.  Gastrointestinal: Negative for abdominal pain, blood in stool, nausea and vomiting.  Genitourinary: Negative for dysuria and urgency.    Musculoskeletal: Negative for arthralgias and myalgias.  Skin: Negative for pallor and wound.  Neurological: Positive for dizziness. Negative for headaches.     Physical Exam Updated Vital Signs BP 124/77 (BP Location: Left Arm)   Pulse 77   Temp 98.8 F (37.1 C) (Oral)   Resp 16   Ht  (1.6 m)   Wt (!) 139.7 kg   LMP 12/17/2018 (Exact Date)   SpO2 95%   BMI 54.56 kg/m   Physical Exam Vitals signs and nursing note reviewed.  Constitutional:      General: She is not in acute distress.    Appearance: She is well-developed. She is not diaphoretic.  HENT:     Head: Normocephalic and atraumatic.     Comments: Serous effusion to the right TM.  Tenderness to percussion of the right frontal sinus Eyes:     Pupils: Pupils are equal, round, and reactive to light.  Neck:     Musculoskeletal: Normal range of motion and neck supple.  Cardiovascular:     Rate and Rhythm: Normal rate and regular rhythm.     Heart sounds: No murmur. No friction rub. No gallop.   Pulmonary:     Effort: Pulmonary effort is normal.     Breath sounds: No wheezing or rales.  Abdominal:     General: There is no distension.     Palpations: Abdomen is soft.     Tenderness: There is no abdominal tenderness.  Musculoskeletal:        General: No tenderness.  Skin:    General: Skin is warm and dry.  Neurological:     Mental Status: She is alert and oriented to person, place, and time.     Cranial Nerves: Cranial nerves are intact.     Sensory: Sensation is intact.     Motor: Motor function is intact.     Comments: Right-sided fast going nystagmus.  Somewhat unbalanced on her feet but able to ambulate independently.  Finger-nose and heel shin are normal  Psychiatric:        Behavior: Behavior normal.      ED Treatments / Results  Labs (all labs ordered are listed, but only abnormal results are displayed) Labs Reviewed  BASIC METABOLIC PANEL - Abnormal; Notable for the following components:       Result Value   BUN <5 (*)    Anion gap 4 (*)    All other components within normal limits  CBC - Abnormal; Notable for the following components:   Hemoglobin 10.2 (*)    HCT 32.7 (*)    MCH 25.7 (*)    RDW 18.1 (*)    All other components within normal limits  URINALYSIS, ROUTINE W REFLEX MICROSCOPIC - Abnormal; Notable for the following components:   APPearance HAZY (*)  All other components within normal limits  CBG MONITORING, ED  I-STAT BETA HCG BLOOD, ED ([facility], [facility], [facility] ONLY)    EKG EKG Interpretation  Date/Time:  Tuesday December 31 2018 13:51:14 EST Ventricular Rate:  84 PR Interval:  172 QRS Duration: 88 QT Interval:  372 QTC Calculation: 439 R Axis:   60 Text Interpretation:  Normal sinus rhythm Normal ECG No significant change since last tracing Confirmed by Melene Plan [phone]) on 12/31/2018 4:13:05 PM   Radiology No results found.  Procedures Procedures (including critical care time)  Medications Ordered in ED Medications  sodium chloride flush (NS) 0.9 % injection 3 mL (3 mLs Intravenous Given 12/31/18 1701)  sodium chloride 0.9 % bolus 1,000 mL (0 mLs Intravenous Stopped 12/31/18 1803)  prochlorperazine (COMPAZINE) injection 10 mg (10 mg Intravenous Given 12/31/18 1701)  diphenhydrAMINE (BENADRYL) injection 25 mg (25 mg Intravenous Given 12/31/18 1701)  dexamethasone (DECADRON) tablet 10 mg (10 mg Oral Given 12/31/18 1700)     Initial Impression / Assessment and Plan / ED Course  I have reviewed the triage vital signs and the nursing notes.  Pertinent labs & imaging results that were available during my care of the patient were reviewed by me and considered in my medical decision making (see chart for details).        43 yo F with a chief complaint of unsteadiness on her feet and multiple syncopal events.  The syncopal events independently sound like a vasovagal syncope.  She had a prodrome and then collapsed to the ground and then felt better afterwards.   Her dizziness sounds more like peripheral versus central vertigo.  She had some mild cough at the beginning of the month but denies current URI symptoms.  Clinically she does have an effusion to the right TM and has some pain with percussion on her right frontal sinus.  This may be sinusitis but patient not having significant improvement with the headache cocktail, I discussed the case with neurology Dr. Jerrell Belfast he recommended an MRI of the brain as well as an MRA of the head and neck.  The patient went back for MRI and unfortunately her hair is mounted into her scalp with some metallic fragments.  She was unfortunately unable to get the MRI done.  When I went back to reassess the patient I talked about a CT angiogram of the head neck as an alternative to evaluate for dissection, at this point she is declining and would prefer to go home and follow-up with her doctor tomorrow.  She has an appointment scheduled for 4 PM.  I discussed with her the reasoning to do the imaging study, with her sinus pain and right ear effusion I will start her on antibiotics for sinusitis.  Prescription for meclizine.  Referral to neurology.  I offered for her to return any time that she change her mind and wanted imaging performed.  8:04 PM:  I have discussed the diagnosis/risks/treatment options with the patient and believe the pt to be eligible for discharge home to follow-up with PCP, Neuro. We also discussed returning to the ED immediately if new or worsening sx occur. We discussed the sx which are most concerning (e.g., sudden worsening pain, fever, inability to tolerate by mouth) that necessitate immediate return. Medications administered to the patient during their visit and any new prescriptions provided to the patient are listed below.  Medications given during this visit Medications  sodium chloride flush (NS) 0.9 % injection 3 mL (3 mLs Intravenous  Given 12/31/18 1701)  sodium chloride 0.9 % bolus 1,000 mL (0 mLs  Intravenous Stopped 12/31/18 1803)  prochlorperazine (COMPAZINE) injection 10 mg (10 mg Intravenous Given 12/31/18 1701)  diphenhydrAMINE (BENADRYL) injection 25 mg (25 mg Intravenous Given 12/31/18 1701)  dexamethasone (DECADRON) tablet 10 mg (10 mg Oral Given 12/31/18 1700)     The patient appears reasonably screen and/or stabilized for discharge and I doubt any other medical condition or other [facility] requiring further screening, evaluation, or treatment in the ED at this time prior to discharge.    Final Clinical Impressions(s) / ED Diagnoses   Final diagnoses:  Dizziness  Acute frontal sinusitis, recurrence not specified    ED Discharge Orders         Ordered    amoxicillin-clavulanate (AUGMENTIN) 875-125 MG tablet  2 times daily     12/31/18 2001    meclizine (ANTIVERT) 25 MG tablet  3 times daily PRN     12/31/18 2001    Ambulatory referral to Neurology    Comments:  Vertigo, concerning for posterior circulation stroke   12/31/18 2002           Melene Plan, DO 12/31/18 2004

## 2018-12-31 NOTE — ED Triage Notes (Signed)
Pt states shes had multiple episodes of dizziness, passing out, fully loss of consciousness. Dr. Chaney Malling with patient talking to her, sent her to ER.

## 2018-12-31 NOTE — Discharge Instructions (Addendum)
Please return to the ED at any time you change her mind would like further imaging.  Please follow-up with your family doctor.  I have placed a referral to the neurologist, they should call you on the phone to set up an appointment.

## 2018-12-31 NOTE — ED Triage Notes (Signed)
Pt presents from [facility] for ongoing dizziness x 3 weeks. Reports she has had 2 syncopal episodes last week. Reports she feels her face is swollen. Has not seen PCP.

## 2018-12-31 NOTE — Progress Notes (Signed)
Left message with Olevia Bowens, [place] and he is aware pt will have to have metal in hair cut out before continuing with MRI.  Dr. Phill Myron reviewed images and determined them non diagnostic due to the huge metal artifact. For questions contact Dr. Phill Myron at [phone].

## 2019-01-01 ENCOUNTER — Encounter: Payer: Self-pay | Admitting: Internal Medicine

## 2019-01-01 ENCOUNTER — Ambulatory Visit: Payer: 59 | Admitting: Internal Medicine

## 2019-01-01 VITALS — BP 138/88 | HR 80 | Resp 14 | Ht 64.0 in | Wt 309.0 lb

## 2019-01-01 DIAGNOSIS — R42 Dizziness and giddiness: Secondary | ICD-10-CM | POA: Diagnosis not present

## 2019-01-01 DIAGNOSIS — F329 Major depressive disorder, single episode, unspecified: Secondary | ICD-10-CM

## 2019-01-01 DIAGNOSIS — F32A Depression, unspecified: Secondary | ICD-10-CM

## 2019-01-01 DIAGNOSIS — F419 Anxiety disorder, unspecified: Secondary | ICD-10-CM

## 2019-01-01 DIAGNOSIS — J0140 Acute pansinusitis, unspecified: Secondary | ICD-10-CM

## 2019-01-01 MED ORDER — AMOXICILLIN-POT CLAVULANATE 875-125 MG PO TABS: 1.0000 | ORAL_TABLET | Freq: Two times a day (BID) | ORAL | 0 refills | Status: DC

## 2019-01-01 NOTE — Progress Notes (Addendum)
Subjective:    Patient ID: Morgan Molina, female   DOB: 22-Feb-1976, 43 y.o.   MRN: [id number]   HPI   1.  Syncopal episodes started about 2.5 weeks ago.  First happened on a Saturday.  Took her bp and antidepressant meds that morning.  That evening around 8 p.m., drank 24 oz of beer with about 6 wings with friends.  Returned home around 9-9:30 p.m. and getting ready to eat fries.   Got really queasy while she was standing there, a few seconds later, felt dizzy, like she would pass out, felt hot all over and started perspiring.  Went to bathroom and took off all her clothes and tried to put a cool rag on her head.  She apparently screamed and hit the wall ?with her hand.   Her family found her sitting on the toilet without any clothes. Neighbor, who is a Engineer, civil (consulting), lives next door and was there in 5 minutes.  She was told her bp was fine, but her heart rate was a bit slow.  She may have been "out" for 5 minutes.  Had a second episode about 1 week later at work, sitting at a chair, talking to a customer and on the computer.  Felt the world "closing in " and got up and went to bathroom and sat down on toilet.  She did not lose consciousness this time.  Afterward, had a BM that was soft, but had bright red blood in the toilet and on tissue.  No anal or rectal pain with this. She has not had this since.  This was about 1 week ago.  She did continue to have consistent dizziness following this episode. She stopped the Citalopram after this episode.  3rd episode occurred 2 days later, or just less than 1 week ago.  She had stayed home from work since the previous episode.  She noted right facial pain and swelling and pressure above her right eyebrow.  "felt like a brick sitting on my face." Still with the pressure on right face and noted to have swelling.  Noted by her supervisor to have the swelling and was staring at her computer with hand on keyboard without moving.   Was asked to go home.  Tried to get  an appt to be seen.   Continued to have dizziness, face still with pressure. Went to her therapist yesterday and was told to go to the ED.   She was seen there and attempts to get her in for an MRI/MRA were not successful as she has clips in her hairpiece. Unable to see this in record, but her paperwork shows she was given IV corticosteroids, Rx for Augmentin, and Benadryl.  She has not filled any of the prescription meds yet. Did have cold symptoms on 2/13, but that was about 1 week after her first episode The pressure on her right face was the day after the second episode No fevers. Still with a little bit of the pressure.  Only minimal dizziness today.   Through all of this, she has been drinking a lot of water.   Has had bp checked through all of this and has been fine.  Not too high or low.  Generally 120s over 70s Has not checked her pulse, but her bp monitor does alarm if pulse is not in a good range. May have a fleeting sense of fluttering in her chest with dizzy episodes.  Not clear when this occurs with the episodes.  Current Meds  Medication Sig  . irbesartan-hydrochlorothiazide (AVALIDE) 150-12.5 MG tablet Take 1 tablet by mouth daily.   Allergies  Allergen Reactions  . Citalopram Other (See Comments)    Pt fell out  . Lisinopril Cough    ACE I cough     Review of Systems    Objective:   BP (!) 152/100 (BP Location: Left Arm, Patient Position: Sitting, Cuff Size: Large)   Pulse 80   Resp 14   Ht 5\' 4"  (1.626 m)   Wt (!) 309 lb (140.2 kg)   LMP 12/17/2018 (Exact Date)   BMI 53.04 kg/m   Physical Exam  NAD, holding right face HEENT; PERRL, EOMI, Discs sharp, Very tender over right frontal and maxillary areas.  Right nasal mucosa very swollen and red.  Left less so.  TMs bulging and dull with fluid behind.  No erythema. Throat without injection or exudate. MMM Neck:  Supple, No adenopathy, no thyromegaly Chest:  CTA CV:  RRR without murmur or rub.  Carotids  without bruit.  Carotid, radial and DP pulses normal and equal Abd:  S, NT, No HSM  mass, + BS Neuro:  A & O x 3, CN  II-XII grossly intact.  DTRs 2+/4 Motor 5/5, sensory to light touch grossly normal.  Rapid alternating motions, finger to nose to finger and Romberg all normal.  Gait normal. + dizziness with Lucious Groves bilaterally--more of spinning, but no nystagmus associated and more prominent when returns to sitting position.  Assessment & Plan  1.  Acute sinusitis:  Not clear if this is the primary problem causing her issues.  Describes both syncope and vertigo, though the latter more prominent now. New Rx for Augmentin. May use Dayquil and to use the nasal corticosteroid spray she has at home. Also nasal saline spray, but use before corticosteroids or no sooner than 2 hours after. Follow up in 1 week to see how she is doing. Hold on Citalopram for now. Will see if her symptoms improve with this treatment.  2.  Depression/anxiety:  Feel Citalopram is not the cause of her symptoms, but due to patient anxiety with the medication, will hold for now.      Spent 1 hour face to face with patient

## 2019-01-01 NOTE — Patient Instructions (Signed)
Dayquil as see on directions--drink lots of water. Use your nasal steroids 1 spray each nostril daily. Saline nasal spray, but not for 2 hours after using nasal steroids.

## 2019-01-07 ENCOUNTER — Ambulatory Visit: Payer: 59 | Admitting: Internal Medicine

## 2019-01-14 ENCOUNTER — Ambulatory Visit (INDEPENDENT_AMBULATORY_CARE_PROVIDER_SITE_OTHER): Payer: 59 | Admitting: Internal Medicine

## 2019-01-14 ENCOUNTER — Encounter: Payer: Self-pay | Admitting: Internal Medicine

## 2019-01-14 VITALS — BP 132/80 | HR 84 | Resp 12 | Ht 64.0 in | Wt 306.0 lb

## 2019-01-14 DIAGNOSIS — N6310 Unspecified lump in the right breast, unspecified quadrant: Secondary | ICD-10-CM

## 2019-01-14 DIAGNOSIS — F419 Anxiety disorder, unspecified: Secondary | ICD-10-CM

## 2019-01-14 DIAGNOSIS — R55 Syncope and collapse: Secondary | ICD-10-CM | POA: Diagnosis not present

## 2019-01-14 DIAGNOSIS — F329 Major depressive disorder, single episode, unspecified: Secondary | ICD-10-CM

## 2019-01-14 DIAGNOSIS — R42 Dizziness and giddiness: Secondary | ICD-10-CM

## 2019-01-14 DIAGNOSIS — I10 Essential (primary) hypertension: Secondary | ICD-10-CM

## 2019-01-14 DIAGNOSIS — J0140 Acute pansinusitis, unspecified: Secondary | ICD-10-CM

## 2019-01-14 MED ORDER — AMOXICILLIN-POT CLAVULANATE 875-125 MG PO TABS: 1.0000 | ORAL_TABLET | Freq: Two times a day (BID) | ORAL | 0 refills | Status: DC

## 2019-01-14 MED ORDER — CLONAZEPAM 1 MG PO TABS: ORAL_TABLET | ORAL | 0 refills | Status: DC

## 2019-01-14 NOTE — Progress Notes (Signed)
Subjective:    Patient ID: Morgan Molina, female   DOB: May 14, 1976, 43 y.o.   MRN: [id number]   HPI   1.  Sinusitis:  Finished Augmentin this past Saturday, or 4 days ago to complete 10 day course.  Physician was out of the office last week when originally scheduled for follow up.   The pain in her right face is gone, but not the pressure.   Right ear still with a lot of pressure.  2.  Sarcoidosis:  Brings this up today and states "this was in storage" in her mind.  Diagnosed in 1995-1996.  Diagnosed by a Dr. Christell Constant in [facility].  Her symptoms were pulmonary.  Recurrent bronchitis.   She was treated with prednisone off and on for 1 year.  She states she would just stop her medications on her own and did not follow up as recommended. She also had albuterol to use as needed. She did not feel her breathing was bothering her that much, so just did not follow up long term. She was a nonsmoker at that point and in her 53s.   She started smoking in 1998. She smokes 4 cigarettes on the same day 3-4 times weekly.    3.  Dizziness:    Type 1 Symptoms of presyncope/light headedness:  Has had two episodes since last seen and started on antibiotics where she was being exposed to cigarette smoke. She does note feeling flutter in her chest for a brief moment, then develops anxiety and then the nausea and heat take over and she feels light headed.   One episode was while sitting in the kitchen and the second was in a car.  These times, she put her head down for about 20 minutes and then went to bed.  Did not check her pulse as discussed. She does have a digital wrist bp monitor that catches pulse as well. Her ECG end of February when seen in ED was normal. After patient left today, found an old record from ED in 2016 with similar episode of presyncope with no concerning findings, including a normal ECG. Does not appear she had follow up for this as outpatient.  Type2 She is also having vertigo or  spinning dizziness as well.  Any glaring light sets this off.  If moves too quickly, triggers. This is definitely improved from what she was having before treating her sinusitis.      4.  Depression/Anxiety/possible PTSD relating to her son's death/Insomnia:   She stopped the Citalopram now about 3 weeks or more ago and continues to have these episodes.   She gives information today that her son who died at the age of 100 yo had his birthday in January, the month she reestablished care with [place] for depression and anxiety and his death was on Easter Sunday, which is the exact same date this year.   She shares with me information relating to his death today that she has not shared with anyone else, including her counselor, Emilee Hero.   Painful memories which have added to the difficulty of her son's death and have affected everyone in the family and their relationships.  She is still out of work.  Her counselor agrees she is not healthy to return to work at this time as well.  Current Meds  Medication Sig  . irbesartan-hydrochlorothiazide (AVALIDE) 150-12.5 MG tablet Take 1 tablet by mouth daily.  . meclizine (ANTIVERT) 25 MG tablet Take 1 tablet (25 mg total)  by mouth 3 (three) times daily as needed for dizziness. (Patient not taking: Reported on 01/14/2019)   Current Meds  Medication Sig  . irbesartan-hydrochlorothiazide (AVALIDE) 150-12.5 MG tablet Take 1 tablet by mouth daily.  . meclizine (ANTIVERT) 25 MG tablet Take 1 tablet (25 mg total) by mouth 3 (three) times daily as needed for dizziness. (Patient not taking: Reported on 01/14/2019)     Review of Systems    Objective:   BP 132/80 (BP Location: Left Arm, Patient Position: Sitting, Cuff Size: Large)   Pulse 84   Resp 12   Ht 5\' 4"  (1.626 m)   Wt (!) 306 lb (138.8 kg)   LMP 12/17/2018 (Exact Date)   BMI 52.52 kg/m   Physical Exam  Smells strongly of cigarette smoke when enter room.  States has not smoked today. Around a  lot of smokers in her home and at other's homes. Tearful as she shares new information today.  Anxious appearing at time. HEENT:  PERRL, EOMI, Right TM still thickened, though less bulging, Left TM is pearly gray now.  Tender over right maxillary sinus, though frontal now nontender.  Right nasal mucosa still quite swollen and red.  Left nasal mucosa with mild swelling and erythema. Neck:  Supple, No adenopathy Chest:  CTA CV:  RRR without murmur or rub.  Radial pulses normal and equal Neuro:  Unchanged.  Did not check Lucious Groves maneuver today.  Assessment & Plan  1.  Sinusitis/serous right otitis and eustachian tube dysfunction:  Still not completely cleared, but improved.  Repeat treatment with Augmentin for 10 more days. To continue with nasal corticosteroids. To try the Dayquil as well. Push fluids. Feel the vertiginous dizziness is related.  2.  Presyncope/Syncope:  Very difficult to ascertain if this is all related to her anxiety and depression. With history of fluttering in chest, will have her see Cardiology.   Feel this will also allay some of her anxiety as well. Do not feel this is due to the Citalopram. Discussed she continues to have symptoms despite being off Citalopram (which she had not taken for even a month).  3.  Depression/Anxiety, severe/likely PTSD:  To restart Citalopram.  She feels she will be able to do this by this Thursday. Have also written for one time Rx only, Clonazepam 1 mg to take 1/2 to 1 tab twice daily as needed. As she has not slept well, may save evening dose for bedtime next couple of nights as that will calm her symptoms even more so to get a good night's sleep. Encouraged her to share the information she shared with me with her counselor, Emilee Hero.   Discussed her counselor, whom she trusts, is the best person to help work through her difficulties with the circumstances surrounding the loss of her son. Follow up in 1 week. Will fill out paperwork  for her work.  4.  Right Breast Mass:  Needs this evaluated.  Not at goal with control of depression and anxiety at this point and anniversary of son's death soon.  Will discuss somewhat at next visit and see if this just causes more difficulty with anxiety.  Spent 1.5 hours with patient face to face today.

## 2019-01-23 ENCOUNTER — Other Ambulatory Visit: Payer: Self-pay

## 2019-01-23 ENCOUNTER — Encounter: Payer: Self-pay | Admitting: Internal Medicine

## 2019-01-23 ENCOUNTER — Ambulatory Visit: Payer: 59 | Admitting: Internal Medicine

## 2019-01-23 VITALS — BP 124/82 | HR 62 | Resp 12 | Ht 64.0 in | Wt 306.0 lb

## 2019-01-23 DIAGNOSIS — R55 Syncope and collapse: Secondary | ICD-10-CM

## 2019-01-23 DIAGNOSIS — F32A Depression, unspecified: Secondary | ICD-10-CM

## 2019-01-23 DIAGNOSIS — F410 Panic disorder [episodic paroxysmal anxiety] without agoraphobia: Secondary | ICD-10-CM

## 2019-01-23 DIAGNOSIS — N6310 Unspecified lump in the right breast, unspecified quadrant: Secondary | ICD-10-CM | POA: Diagnosis not present

## 2019-01-23 DIAGNOSIS — F419 Anxiety disorder, unspecified: Secondary | ICD-10-CM | POA: Diagnosis not present

## 2019-01-23 DIAGNOSIS — R42 Dizziness and giddiness: Secondary | ICD-10-CM | POA: Diagnosis not present

## 2019-01-23 DIAGNOSIS — J0140 Acute pansinusitis, unspecified: Secondary | ICD-10-CM

## 2019-01-23 DIAGNOSIS — F329 Major depressive disorder, single episode, unspecified: Secondary | ICD-10-CM

## 2019-01-23 NOTE — Progress Notes (Signed)
    Subjective:    Patient ID: Morgan Molina, female   DOB: [birth year], 43 y.o.   MRN: [id number]   HPI   1.  Sinusitis:  Is completing an added course of Augmentin.  Her dizziness/vertigo is lightening up.  She has not had any presyncopal or syncopal episodes.   She has quit smoking.    2.  Depression/anxiety/Panic disorder/?PTSD:  She did get started on Citalopram 6 days ago.  She has not used Clonazepam as was to anxious to try.    3.  Right Breast mass:  She is ready to address at this time.  Dr. Magnus Ivan was the surgeon she was to see then.  She has not had a mammogram since July.  Had an intraductal mass then recommended for biopsy.  Current Meds  Medication Sig  . amoxicillin-clavulanate (AUGMENTIN) 875-125 MG tablet Take 1 tablet by mouth 2 (two) times daily.  . citalopram (CELEXA) 10 MG tablet Take 1 tablet (10 mg total) by mouth daily.  Marland Kitchen ibuprofen (ADVIL,MOTRIN) 200 MG tablet Take 200 mg by mouth every 6 (six) hours as needed.  . irbesartan-hydrochlorothiazide (AVALIDE) 150-12.5 MG tablet Take 1 tablet by mouth daily.  . meclizine (ANTIVERT) 25 MG tablet Take 1 tablet (25 mg total) by mouth 3 (three) times daily as needed for dizziness.   Allergies  Allergen Reactions  . Lisinopril Cough    ACE I cough     Review of Systems    Objective:   BP 124/82 (BP Location: Left Arm, Patient Position: Sitting, Cuff Size: Large)   Pulse 62   Resp 12   Ht 5\' 4"  (1.626 m)   Wt (!) 306 lb (138.8 kg)   LMP 01/14/2019   BMI 52.52 kg/m   Physical Exam  NAD HEENT:  PERRL, EOMI, Left TM pearly gray, Right TM now pearly gray as well and without dullness or thickening as before.  Nasal mucosa on right with decreased swelling and discharge.  Left nasal mucosa close to normal with mild swelling. Mildly tender over right maxillary sinus.   Neck:  Supple, No adenopathy Chest:  CTA CV:  RRR without murmur or rub.  Radial pulses normal and equal LE:  No edema   Assessment & Plan    1.  Sinusitis:  Finally improving significantly.  Vertiginous symptoms improving and appear related to this.   Finish Augmentin.  Call if diarrhea worsens or does not resolve with time after course completed. Continue Flonase. Discussed how to make nasal saline for snorting.  2.  Presyncope/syncope:  Has not had any further episodes since seen on the 10th.   Cardiology referral sent, but on hold until better idea where we are with Coronavirus pandemic.  3.  Right Breast mass:  She is ready to re address.  Repeat right breast mammogram as we are 8 months out from last mammogram.  Check for stability. Re refer back to Dr. Magnus Ivan, surgery.   Follow up in 1 month as long as pandemic improving.

## 2019-01-23 NOTE — Patient Instructions (Signed)
Call if diarrhea gets worse or does not resolve in days after finish Augmentin Eat yogurt, especially Greek yogurt.  Not fat free--2-5% fat please. Call if you develop vaginal discharge and/or itching and I will send in a prescription for yeast infection

## 2019-02-05 ENCOUNTER — Other Ambulatory Visit: Payer: Self-pay | Admitting: Internal Medicine

## 2019-02-05 DIAGNOSIS — N6310 Unspecified lump in the right breast, unspecified quadrant: Secondary | ICD-10-CM

## 2019-02-06 ENCOUNTER — Other Ambulatory Visit: Payer: Self-pay | Admitting: Internal Medicine

## 2019-02-06 DIAGNOSIS — N6452 Nipple discharge: Secondary | ICD-10-CM

## 2019-02-10 ENCOUNTER — Other Ambulatory Visit: Payer: Self-pay

## 2019-02-10 ENCOUNTER — Ambulatory Visit: Payer: 59

## 2019-02-10 ENCOUNTER — Ambulatory Visit
Admission: RE | Admit: 2019-02-10 | Discharge: 2019-02-10 | Disposition: A | Discharge status: Home / Self Care | Payer: 59 | Source: Ambulatory Visit | Attending: Internal Medicine | Admitting: Internal Medicine

## 2019-02-10 ENCOUNTER — Telehealth: Payer: Self-pay | Admitting: Nurse Practitioner

## 2019-02-10 DIAGNOSIS — N6310 Unspecified lump in the right breast, unspecified quadrant: Secondary | ICD-10-CM

## 2019-02-10 DIAGNOSIS — R55 Syncope and collapse: Secondary | ICD-10-CM

## 2019-02-10 NOTE — Telephone Encounter (Signed)
Called patient in regards to her appointment scheduled with Dr. Elease Hashimoto on 4/13. I reviewed the Covid 92 policy with her and she c/o syncope 3-4 times over the past few months, with the most recent episode 3-4 weeks ago. She states she feels fluttering in her chest, feels hot and then her husband tells her she screams and then slumps over. She states she loses consciousness. I advised that we would likely want her to wear a monitor which we could go ahead and place in the next few days by mailing it to her. I advised someone from our office will call her with instructions. She gave me her physical address for the delivery of the monitor if needed and thanked me for the call.  I reviewed plan of care with Sherri, RN who works with Dr. Elberta Fortis EP and she agrees that monitor report will be most beneficial to correct diagnosis of the patient's cause of syncope.

## 2019-02-11 NOTE — Telephone Encounter (Signed)
I agree with the plan as outlined by Eligha Bridegroom, RN to send out a monitor for further evaluation of her episodes of syncope

## 2019-02-12 ENCOUNTER — Encounter: Payer: Self-pay | Admitting: *Deleted

## 2019-02-12 ENCOUNTER — Telehealth: Payer: Self-pay | Admitting: *Deleted

## 2019-02-12 NOTE — Progress Notes (Signed)
Patient ID: Morgan Molina, female   DOB: Aug 19, 1976, 43 y.o.   MRN: [id number]  Patient enrolled for [employer] to ship a 30 day cardiac event monitor to [address] [place], [place] [address].

## 2019-02-12 NOTE — Telephone Encounter (Signed)
New message   Patient is needing to get in contact with you to give you some information on where to send the heart monitor. Please contact the patient.

## 2019-02-12 NOTE — Telephone Encounter (Signed)
LMAM Please call office and leave street address so we may have the cardiac event monitor which Dr. Elease Hashimoto ordered shipped to your residence.

## 2019-02-13 NOTE — Telephone Encounter (Signed)
  Ms Morgan Molina address to mail monitor to is:  [address] [place] [address]

## 2019-02-17 ENCOUNTER — Ambulatory Visit: Payer: 59 | Admitting: Cardiovascular Disease

## 2019-02-18 ENCOUNTER — Encounter (INDEPENDENT_AMBULATORY_CARE_PROVIDER_SITE_OTHER): Payer: 59

## 2019-02-18 DIAGNOSIS — R55 Syncope and collapse: Secondary | ICD-10-CM

## 2019-02-19 ENCOUNTER — Telehealth (INDEPENDENT_AMBULATORY_CARE_PROVIDER_SITE_OTHER): Payer: 59 | Admitting: Internal Medicine

## 2019-02-19 ENCOUNTER — Ambulatory Visit: Payer: 59 | Admitting: Internal Medicine

## 2019-02-19 ENCOUNTER — Other Ambulatory Visit: Payer: Self-pay

## 2019-02-19 DIAGNOSIS — I10 Essential (primary) hypertension: Secondary | ICD-10-CM

## 2019-02-19 DIAGNOSIS — J0140 Acute pansinusitis, unspecified: Secondary | ICD-10-CM

## 2019-02-19 DIAGNOSIS — J302 Other seasonal allergic rhinitis: Secondary | ICD-10-CM

## 2019-02-19 DIAGNOSIS — N6452 Nipple discharge: Secondary | ICD-10-CM | POA: Diagnosis not present

## 2019-02-19 DIAGNOSIS — F419 Anxiety disorder, unspecified: Secondary | ICD-10-CM

## 2019-02-19 DIAGNOSIS — R55 Syncope and collapse: Secondary | ICD-10-CM | POA: Diagnosis not present

## 2019-02-19 DIAGNOSIS — M25562 Pain in left knee: Secondary | ICD-10-CM

## 2019-02-19 DIAGNOSIS — M545 Low back pain, unspecified: Secondary | ICD-10-CM | POA: Insufficient documentation

## 2019-02-19 DIAGNOSIS — F329 Major depressive disorder, single episode, unspecified: Secondary | ICD-10-CM

## 2019-02-19 DIAGNOSIS — F410 Panic disorder [episodic paroxysmal anxiety] without agoraphobia: Secondary | ICD-10-CM

## 2019-02-19 DIAGNOSIS — G8929 Other chronic pain: Secondary | ICD-10-CM

## 2019-02-19 MED ORDER — FEXOFENADINE HCL 180 MG PO TABS: 180.0000 mg | ORAL_TABLET | Freq: Every day | ORAL | 11 refills | Status: DC

## 2019-02-19 NOTE — Progress Notes (Signed)
Subjective:    Patient ID: Morgan Molina, female   DOB: [birth year], 43 y.o.   MRN: [id number]   HPI   1.  Sinusitis:  Finished extended course of Augmentin about 2 weeks ago.  All of her ear and congestion symptoms went away.  Vertigo was almost gone.  She feels her right ear and nostril are aching again and clogging back up in past 3-4 days.   She is using Flonase 2 sprays each nostril daily. Not really sneezing much.    2.  Depression/anxiety:  Taking Citalopram.  Needed to take Clonazepam this past week as this week has been hard.  Lot of disagreement with smoking in the house and her sister going out and risking COVID-19 infection. Anniversary of her son's death also.  This year the anniversary is the same day his death occurred (Easter). She isolated herself from rest of family and was able to get through it.   Speaking with counselor weekly.  She has shared with her more circumstances of her son's death and how it has affected her family. Weight off her shoulders.  3.  Presyncope/syncope:  She has been set up with a 30 day heart monitor via cardiology.  Has had palpitations at times, but no presyncopal/syncopal episodes.  4.  Knees and back:  Has tried Diclofenac gel.  Tylenol not helping.  5.  Right Breast mass:  [facility] to [facility] an [facility] of breast and to refer to Dr. Magnus IvanBlackman.  I have not seen that in her chart.  Did see she had the mammogram.  5.  Hypertension:  bp has been in 120/78 range with Irbesartan/HCTZ.  Current Meds  Medication Sig  . citalopram (CELEXA) 10 MG tablet Take 1 tablet (10 mg total) by mouth daily.  . clonazePAM (KLONOPIN) 1 MG tablet 1/2 to 1 tab by mouth twice daily as needed  . fluticasone (FLONASE) 50 MCG/ACT nasal spray Place 2 sprays into both nostrils daily. 2 sprays each nostril daily.  . irbesartan-hydrochlorothiazide (AVALIDE) 150-12.5 MG tablet Take 1 tablet by mouth daily.   Allergies  Allergen Reactions  .  Lisinopril Cough    ACE I cough     Review of Systems    Objective:   There were no vitals taken for this visit.  Physical Exam   Looks well.   Normal affect.   Assessment & Plan  1.  Sinusitis with vertigo:  Not clear if she is having a recurrence of sinusitis or if these are allergy symptoms.   To continue the Flonase daily and nasal saline. Add Fexofenadine 180 mg daily. To call if does not improve over next 2 weeks. Follow up in 6 weeks.  2.  Depression/anxiety:  She seems to be doing a bit better.  Continues with weekly counseling. Hopefully sharing information with her counselor she has kept inside for so many years will allow her to move forward with her other children. She is not ready to increase Citalopram to 20 mg daily.   Asked her to consider doing so when she feels up to it.  3.  Hypertension:  Controlled based on home monitoring.  4.  Palpitations with presyncope/syncope:  30 day event monitor with cardiology in progress.  5. Left Nipple discharge:  [facility] and they do need an order for MR of bilateral breasts and referral back to Dr. Magnus IvanBlackman.   My previous understanding of this was incorrect in that concern  was still directed at right breast mass.   She did previously undergo biopsy and that was found to be benign.   She was supposed to follow up on her left breast bloody discharge. She previously failed the appt for the MR of breasts.  6.  Knee and back pain:  Limited currently.  To continue with Tylenol and try to stay active.  Spent 50 + minutes with patient face to face on video chat today.  Followup in 6 weeks.

## 2019-02-19 NOTE — Patient Instructions (Signed)
[facility] [address]  [place], [place] [address] [phone] 

## 2019-02-20 NOTE — Progress Notes (Signed)
Spoke with patient. States yes she will try to put one to the side for MRI because she will definitely need it. Patient verbalized understanding that test more than likely ill not be scheduled until June or even possibly July.

## 2019-02-20 NOTE — Progress Notes (Signed)
Left message for patient to call back  

## 2019-02-25 ENCOUNTER — Telehealth: Payer: Self-pay

## 2019-02-25 NOTE — Telephone Encounter (Signed)
Left detailed message for patient to call back if symptoms are not better after trying hydrocortisone

## 2019-02-25 NOTE — Telephone Encounter (Signed)
She should use the hydrocortisone twice daily and cool compresses. If no better by end of week--to call.

## 2019-02-25 NOTE — Telephone Encounter (Signed)
Spoke with patient states she had to stop her holter monitor at home due to the adhesive from the leads causing a really bad rash on her chest. Patient states she spoke with cardiology and they are going to give her a new lead today. State she has been using hydrocortisone cream for the rash. Patient wants to know if this is okay or should she be on something different to help clear up the rash.  To Dr. Delrae Alfred for further directions.

## 2019-03-14 ENCOUNTER — Other Ambulatory Visit: Payer: Self-pay | Admitting: Internal Medicine

## 2019-03-17 ENCOUNTER — Other Ambulatory Visit: Payer: Self-pay

## 2019-03-26 ENCOUNTER — Telehealth: Payer: Self-pay | Admitting: Cardiovascular Disease

## 2019-03-26 NOTE — Telephone Encounter (Signed)
Informed of event monitor results per Dr. Elease Hashimoto.  She is seeing her primary medical doctor in one week and will go from there as to what her next steps should be.  Result Notes for Cardiac event monitor   Notes recorded by Levi Aland, RN on 03/25/2019 at 4:49 PM EDT Left message for patient to call office for results ------ Notes recorded by Nahser, Deloris Ping, MD on 03/25/2019 at 8:07 AM EDT NSR, no significant arrhythmias

## 2019-03-26 NOTE — Telephone Encounter (Signed)
New message   Patient is returning Michelle's message for monitor results. Please call.

## 2019-04-02 ENCOUNTER — Ambulatory Visit (INDEPENDENT_AMBULATORY_CARE_PROVIDER_SITE_OTHER): Payer: 59 | Admitting: Internal Medicine

## 2019-04-02 ENCOUNTER — Other Ambulatory Visit: Payer: Self-pay

## 2019-04-02 ENCOUNTER — Encounter: Payer: Self-pay | Admitting: Internal Medicine

## 2019-04-02 VITALS — BP 124/72 | HR 84 | Resp 12 | Ht 64.0 in | Wt 292.0 lb

## 2019-04-02 DIAGNOSIS — R55 Syncope and collapse: Secondary | ICD-10-CM

## 2019-04-02 DIAGNOSIS — M5442 Lumbago with sciatica, left side: Secondary | ICD-10-CM

## 2019-04-02 DIAGNOSIS — M5441 Lumbago with sciatica, right side: Secondary | ICD-10-CM

## 2019-04-02 DIAGNOSIS — F419 Anxiety disorder, unspecified: Secondary | ICD-10-CM | POA: Diagnosis not present

## 2019-04-02 DIAGNOSIS — F329 Major depressive disorder, single episode, unspecified: Secondary | ICD-10-CM

## 2019-04-02 DIAGNOSIS — N6452 Nipple discharge: Secondary | ICD-10-CM | POA: Insufficient documentation

## 2019-04-02 MED ORDER — CITALOPRAM HYDROBROMIDE 10 MG PO TABS: 10.0000 mg | ORAL_TABLET | Freq: Every day | ORAL | 3 refills | Status: DC

## 2019-04-02 MED ORDER — CLONAZEPAM 1 MG PO TABS: ORAL_TABLET | ORAL | 0 refills | Status: DC

## 2019-04-02 NOTE — Progress Notes (Signed)
    Subjective:    Patient ID: Morgan Molina, female   DOB: August 03, 1976, 43 y.o.   MRN: [id number]   HPI   1.  Obesity:  Has lost 14 lbs since last time here in clinic.   2.  Depression:  Episodes of more difficulty on and off.  She feels she is doing well enough on the 10 mg of Citalopram and does not want a higher dose.   She is focused on working on herself.  Feels she has finally admitted she does not want to live the way she has been living all her life and so is taking the antidepressant and working on how to deal with difficult situations. Continues with counseling. Going back to work on Monday.    3.  Left breast bloody nipple discharge:  She has been called to schedule and was not aware of the calls.  Has the info from [place] now to call on her own.  4.  Syncope/vertigo:  Many of symptoms resolved with treatment of panic disorder and sinusitis.   She completed event monitor with cardiology recently and was normal  5.  Back pain and knee pain:  The latter is quiescent. Back pain is still an issue  Current Meds  Medication Sig  . citalopram (CELEXA) 10 MG tablet TAKE 1 TABLET BY MOUTH EVERY DAY  . clonazePAM (KLONOPIN) 1 MG tablet 1/2 to 1 tab by mouth twice daily as needed  . fluticasone (FLONASE) 50 MCG/ACT nasal spray Place 2 sprays into both nostrils daily. 2 sprays each nostril daily.  . irbesartan-hydrochlorothiazide (AVALIDE) 150-12.5 MG tablet Take 1 tablet by mouth daily.   Allergies  Allergen Reactions  . Lisinopril Cough    ACE I cough  . Adhesive [Tape] Rash     Review of Systems    Objective:   BP 124/72 (BP Location: Left Arm, Patient Position: Sitting, Cuff Size: Large)   Pulse 84   Resp 12   Ht 5\' 4"  (1.626 m)   Wt 292 lb (132.5 kg)   LMP 03/11/2019   BMI 50.12 kg/m   Physical Exam  NAD MS:  Tender over lumbar spinous processes fairly diffusely and bilateral paraspinous musculature. Neuro:  LE:  Motor 5/5, DTRs 2+/4 throughout.  Gait  normal   Assessment & Plan  1.  Obesity:  Doing very well, though not clear she has made significant lifestyle changes. Her stress seems improved, which may be helping.  2.  Depression/anxiety/panic attacks:  Generally doing well. Encouraged her to not close her mind completely to the possibility of a higher dose, but support her decision to stay at 10 mg of Citalopram and utilize Clonazepam on a very limited as needed basis.   She also continues with counseling. Long discussion today also regarding her niece, sister in law and MIL from [place] and family dysfunction.  3.  Syncope:  No concerning findings, including event monitor.    4.  Left bloody nipple discharge history.  She will call and set up her appt.  5.  Bilateral lumbar radiculopathy:  Has lost some weight but not likely enough to have a significant effect on her back pain. Lumbar spine films Referral to PT.  Follow up in 1 month to go over #5 mainly

## 2019-05-13 ENCOUNTER — Other Ambulatory Visit: Payer: Self-pay

## 2019-05-13 ENCOUNTER — Telehealth (INDEPENDENT_AMBULATORY_CARE_PROVIDER_SITE_OTHER): Payer: 59 | Admitting: Internal Medicine

## 2019-05-13 DIAGNOSIS — F419 Anxiety disorder, unspecified: Secondary | ICD-10-CM

## 2019-05-13 DIAGNOSIS — M5442 Lumbago with sciatica, left side: Secondary | ICD-10-CM

## 2019-05-13 DIAGNOSIS — R42 Dizziness and giddiness: Secondary | ICD-10-CM

## 2019-05-13 DIAGNOSIS — F410 Panic disorder [episodic paroxysmal anxiety] without agoraphobia: Secondary | ICD-10-CM

## 2019-05-13 DIAGNOSIS — F329 Major depressive disorder, single episode, unspecified: Secondary | ICD-10-CM | POA: Diagnosis not present

## 2019-05-13 DIAGNOSIS — E669 Obesity, unspecified: Secondary | ICD-10-CM

## 2019-05-13 DIAGNOSIS — M5441 Lumbago with sciatica, right side: Secondary | ICD-10-CM | POA: Diagnosis not present

## 2019-05-13 DIAGNOSIS — I10 Essential (primary) hypertension: Secondary | ICD-10-CM

## 2019-05-13 NOTE — Progress Notes (Signed)
DONE

## 2019-05-13 NOTE — Progress Notes (Signed)
    Subjective:    Patient ID: Morgan Molina, female   DOB: 04/24/1976, 43 y.o.   MRN: [id number]   HPI   1.  Hypertension:  BPs have been excellent.  Generally in the 118/70 range, but no higher than 120/70's range.    2.  Obesity: weight down to 276 lbs!!!!.  Watching what she is eating.  Eats when she is hungry.  Lots of vegetables and fruits.  Staying away from fried fatty foods.   Active during the day caring for grandchild.  Up and down stairs with work.   3.  Lumbar back pain:  Did not have anymore FMLA, so did not go for her xrays of low back. PT did contact her and she has not had time to make an appt due to job hours.   Still having pain, does have lumbar pillow.  More problems with work and sitting for period of time, but does have ability to stand and use her computer and phone  4.  Dizziness:  Lights and glares set her vertigo off.  Computer light with work also makes this worse.   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  5.  Panic Disorder/depression/Anxiety:  Has had 4 episodes since last seen.  One in [place].  Not sure what set it off.  Not having any long trips in car, so no panic attacks there. She had to leave [employer] and go to her car to self calm.  She could not return to the store.   Was not able to get to her last appt with her counselor.   She is planning to call to get set back up.  They were meeting every 2 weeks. She is still not interested in taking a higher dose of Citalopram.    Current Meds  Medication Sig  . citalopram (CELEXA) 10 MG tablet Take 1 tablet (10 mg total) by mouth daily.  . clonazePAM (KLONOPIN) 1 MG tablet 1/2 to 1 tab by mouth twice daily as needed  . irbesartan-hydrochlorothiazide (AVALIDE) 150-12.5 MG tablet Take 1 tablet by mouth daily.   Allergies  Allergen Reactions  . Lisinopril Cough    ACE I cough  . Adhesive [Tape] Rash     Review of Systems     Objective:   There were no vitals taken for this visit.  Physical Exam  Looks well   Assessment & Plan   1.  Hypertension:  Well controlled by patient's home measurements.  CPM  2.  Low back pain:  She is waiting to see whether there will be lay offs at work and whether she will then take the time for Xray and PT.  3.  Vertigo:  Encouraged taking regular breaks from computer work.  4.  Panic Disorder/anxiety/depression:  Continue Citalopram at 10 mg

## 2019-05-27 ENCOUNTER — Emergency Department (HOSPITAL_COMMUNITY)
Admission: EM | Admit: 2019-05-27 | Discharge: 2019-05-27 | Disposition: A | Discharge status: Home / Self Care | Payer: 59 | Attending: Emergency Medicine | Admitting: Emergency Medicine

## 2019-05-27 ENCOUNTER — Emergency Department (HOSPITAL_COMMUNITY): Payer: 59

## 2019-05-27 ENCOUNTER — Other Ambulatory Visit: Payer: Self-pay

## 2019-05-27 ENCOUNTER — Encounter (HOSPITAL_COMMUNITY): Payer: Self-pay | Admitting: Emergency Medicine

## 2019-05-27 DIAGNOSIS — R200 Anesthesia of skin: Secondary | ICD-10-CM | POA: Insufficient documentation

## 2019-05-27 DIAGNOSIS — R531 Weakness: Secondary | ICD-10-CM | POA: Insufficient documentation

## 2019-05-27 DIAGNOSIS — R0789 Other chest pain: Secondary | ICD-10-CM | POA: Diagnosis present

## 2019-05-27 DIAGNOSIS — R42 Dizziness and giddiness: Secondary | ICD-10-CM | POA: Diagnosis not present

## 2019-05-27 DIAGNOSIS — I10 Essential (primary) hypertension: Secondary | ICD-10-CM | POA: Diagnosis not present

## 2019-05-27 DIAGNOSIS — R55 Syncope and collapse: Secondary | ICD-10-CM | POA: Insufficient documentation

## 2019-05-27 DIAGNOSIS — Z87891 Personal history of nicotine dependence: Secondary | ICD-10-CM | POA: Insufficient documentation

## 2019-05-27 LAB — CBC WITH DIFFERENTIAL/PLATELET
Abs Immature Granulocytes: 0.02 10*3/uL (ref 0.00–0.07)
Basophils Absolute: 0 10*3/uL (ref 0.0–0.1)
Basophils Relative: 1 %
Eosinophils Absolute: 0.2 10*3/uL (ref 0.0–0.5)
Eosinophils Relative: 2 %
HCT: 33.7 % — ABNORMAL LOW (ref 36.0–46.0)
Hemoglobin: 10.7 g/dL — ABNORMAL LOW (ref 12.0–15.0)
Immature Granulocytes: 0 %
Lymphocytes Relative: 38 %
Lymphs Abs: 3.2 10*3/uL (ref 0.7–4.0)
MCH: 25.8 pg — ABNORMAL LOW (ref 26.0–34.0)
MCHC: 31.8 g/dL (ref 30.0–36.0)
MCV: 81.4 fL (ref 80.0–100.0)
Monocytes Absolute: 0.7 10*3/uL (ref 0.1–1.0)
Monocytes Relative: 9 %
Neutro Abs: 4.2 10*3/uL (ref 1.7–7.7)
Neutrophils Relative %: 50 %
Platelets: 422 10*3/uL — ABNORMAL HIGH (ref 150–400)
RBC: 4.14 MIL/uL (ref 3.87–5.11)
RDW: 18.1 % — ABNORMAL HIGH (ref 11.5–15.5)
WBC: 8.3 10*3/uL (ref 4.0–10.5)
nRBC: 0 % (ref 0.0–0.2)

## 2019-05-27 LAB — COMPREHENSIVE METABOLIC PANEL
ALT: 15 U/L (ref 0–44)
AST: 14 U/L — ABNORMAL LOW (ref 15–41)
Albumin: 3.6 g/dL (ref 3.5–5.0)
Alkaline Phosphatase: 37 U/L — ABNORMAL LOW (ref 38–126)
Anion gap: 12 (ref 5–15)
BUN: 8 mg/dL (ref 6–20)
CO2: 22 mmol/L (ref 22–32)
Calcium: 9.2 mg/dL (ref 8.9–10.3)
Chloride: 105 mmol/L (ref 98–111)
Creatinine, Ser: 0.82 mg/dL (ref 0.44–1.00)
GFR calc Af Amer: >60 mL/min (ref >60)
GFR calc non Af Amer: >60 mL/min (ref >60)
Glucose, Bld: 110 mg/dL — ABNORMAL HIGH (ref 70–99)
Potassium: 3.6 mmol/L (ref 3.5–5.1)
Sodium: 139 mmol/L (ref 135–145)
Total Bilirubin: 0.4 mg/dL (ref 0.3–1.2)
Total Protein: 6.7 g/dL (ref 6.5–8.1)

## 2019-05-27 LAB — TROPONIN I (HIGH SENSITIVITY)
Troponin I (High Sensitivity): 4 ng/L (ref <18)
Troponin I (High Sensitivity): 4 ng/L (ref <18)

## 2019-05-27 MED ORDER — SODIUM CHLORIDE 0.9 % IV BOLUS
1000.0000 mL | Freq: Once | INTRAVENOUS | Status: AC
  Administered 2019-05-27: 12:00:00 1000 mL via INTRAVENOUS

## 2019-05-27 NOTE — ED Notes (Signed)
Per charge nurse moved patient to hallway bed.

## 2019-05-27 NOTE — ED Provider Notes (Signed)
[facility]   CSN: [id number] Arrival date & time: 05/27/19  [id number]    History   Chief Complaint Chief Complaint  Patient presents with  . Chest Pain    HPI Morgan Molina is a 43 y.o. female.     43 y.o female with a PMH of HTN, Anxiety presents to the ED with a chief complaint of left sided chest pain x today.  Patient reports waking up this morning, states she brushed her teeth felt thirsty went inside her car along with ran to the store to obtain ginger ale, reports she did not make it to the store as she felt like she was going to pass out, she reports feeling tingling in her face, head spinning, felt like she could not control the car.  Reports calling her husband to come pick her up.  She endorses chest pressure along the left side of her breast radiating to her back.  Endorses feeling clammy and diaphoretic. She reports taking lorazepam along with medication for her anxiety.  She has had similar episodes in the past, this is never happened inside of her car.  She reports having a vasovagal episode a couple months ago where she wore a Holter monitor, with no abnormal results.  She denies any previous history of CAD, shortness of breath, prior history of blood clots.  The history is provided by the patient and medical records.  Chest Pain Associated symptoms: dizziness, numbness and weakness   Associated symptoms: no abdominal pain, no back pain, no cough, no fever, no palpitations, no shortness of breath and no vomiting     Past Medical History:  Diagnosis Date  . Anxiety   . Depression   . Hypertension     Patient Active Problem List   Diagnosis Date Noted  . Nipple discharge 04/02/2019  . Panic attacks 02/19/2019  . Bilateral low back pain 02/19/2019  . Seasonal allergies 02/19/2019  . Anxiety and depression 01/14/2019  . Syncope 01/14/2019  . Breast mass, right 01/14/2019  . Hypertension     Past Surgical  History:  Procedure Laterality Date  . BREAST EXCISIONAL BIOPSY Right   . CHOLECYSTECTOMY    . TUBAL LIGATION       OB History   No obstetric history on file.      Home Medications    Prior to Admission medications   Medication Sig Start Date End Date Taking? Authorizing Provider  citalopram (CELEXA) 10 MG tablet Take 1 tablet (10 mg total) by mouth daily. 04/02/19  Yes Julieanne MansonMulberry, Elizabeth, MD  clonazePAM (KLONOPIN) 1 MG tablet 1/2 to 1 tab by mouth twice daily as needed Patient taking differently: Take 0.5-1 mg by mouth 2 (two) times daily as needed for anxiety.  04/02/19  Yes Julieanne MansonMulberry, Elizabeth, MD  ibuprofen (ADVIL) 200 MG tablet Take 600 mg by mouth every 6 (six) hours as needed for mild pain.   Yes [provider]  irbesartan-hydrochlorothiazide (AVALIDE) 150-12.5 MG tablet Take 1 tablet by mouth daily. 11/11/18  Yes Julieanne MansonMulberry, Elizabeth, MD  fexofenadine (ALLEGRA) 180 MG tablet Take 1 tablet (180 mg total) by mouth daily. Patient not taking: Reported on 04/02/2019 02/19/19   Julieanne MansonMulberry, Elizabeth, MD    Family History Family History  Problem Relation Age of Onset  . Cancer Mother   . Hypertension Mother   . Heart attack Mother   . Breast cancer Mother [id number]  . CAD Father   . Hypertension Father   .  Diabetes Father     Social History Social History   Tobacco Use  . Smoking status: Former Games developermoker  . Smokeless tobacco: Never Used  Substance Use Topics  . Alcohol use: No  . Drug use: No     Allergies   Lisinopril and Adhesive [tape]   Review of Systems Review of Systems  Constitutional: Negative for chills and fever.  HENT: Negative for ear pain and sore throat.   Eyes: Negative for pain and visual disturbance.  Respiratory: Negative for cough and shortness of breath.   Cardiovascular: Positive for chest pain. Negative for palpitations.  Gastrointestinal: Negative for abdominal pain and vomiting.  Genitourinary: Negative for dysuria and hematuria.   Musculoskeletal: Negative for arthralgias and back pain.  Skin: Negative for color change and rash.  Neurological: Positive for dizziness, weakness and numbness. Negative for seizures and syncope.  All other systems reviewed and are negative.    Physical Exam Updated Vital Signs BP 124/76   Pulse 74   Temp 98.2 F (36.8 C) (Oral)   Resp 18   Ht 5' 3.5" (1.613 m)   Wt 123.8 kg   LMP 05/07/2019 (Approximate)   SpO2 99%   BMI 47.60 kg/m   Physical Exam Vitals signs and nursing note reviewed.  Constitutional:      General: She is not in acute distress.    Appearance: She is well-developed. She is not ill-appearing.  HENT:     Head: Normocephalic and atraumatic.     Mouth/Throat:     Pharynx: No oropharyngeal exudate.  Eyes:     Pupils: Pupils are equal, round, and reactive to light.  Neck:     Musculoskeletal: Normal range of motion.  Cardiovascular:     Rate and Rhythm: Regular rhythm.     Heart sounds: Normal heart sounds.  Pulmonary:     Effort: Pulmonary effort is normal. No respiratory distress.     Breath sounds: Normal breath sounds. No decreased breath sounds or wheezing.  Abdominal:     General: Bowel sounds are normal. There is no distension.     Palpations: Abdomen is soft.     Tenderness: There is no abdominal tenderness.  Musculoskeletal:        General: No tenderness or deformity.       Back:     Right lower leg: No edema.     Left lower leg: No edema.  Skin:    General: Skin is warm and dry.  Neurological:     Mental Status: She is alert and oriented to person, place, and time.     Comments: Alert, oriented, thought content appropriate. Speech fluent without evidence of aphasia. Able to follow 2 step commands without difficulty.  Cranial Nerves:  II:  Peripheral visual fields grossly normal, pupils, round, reactive to light III,IV, VI: ptosis not present, extra-ocular motions intact bilaterally  V,VII: smile symmetric, facial light touch sensation  equal VIII: hearing grossly normal bilaterally  IX,X: midline uvula rise  XI: bilateral shoulder shrug equal and strong XII: midline tongue extension  Motor:  5/5 in upper and lower extremities bilaterally including strong and equal grip strength and dorsiflexion/plantar flexion Sensory: light touch normal in all extremities.  Cerebellar: normal finger-to-nose with bilateral upper extremities, pronator drift negative Gait: normal gait and balance       ED Treatments / Results  Labs (all labs ordered are listed, but only abnormal results are displayed) Labs Reviewed  CBC WITH DIFFERENTIAL/PLATELET - Abnormal; Notable for the following  components:      Result Value   Hemoglobin 10.7 (*)    HCT 33.7 (*)    MCH 25.8 (*)    RDW 18.1 (*)    Platelets 422 (*)    All other components within normal limits  COMPREHENSIVE METABOLIC PANEL - Abnormal; Notable for the following components:   Glucose, Bld 110 (*)    AST 14 (*)    Alkaline Phosphatase 37 (*)    All other components within normal limits  URINALYSIS, ROUTINE W REFLEX MICROSCOPIC  TROPONIN I (HIGH SENSITIVITY)  TROPONIN I (HIGH SENSITIVITY)    EKG None  Radiology Dg Chest 2 View  Result Date: 05/27/2019 CLINICAL DATA:  Syncopal episode while driving today. EXAM: CHEST - 2 VIEW COMPARISON:  None. FINDINGS: Normal sized heart. Clear lungs with normal vascularity. Thoracic spine degenerative changes. Upper abdominal surgical clips. IMPRESSION: No acute abnormality. Electronically Signed   By: Claudie Revering M.D.   On: 05/27/2019 10:34    Procedures Procedures (including critical care time)  Medications Ordered in ED Medications  sodium chloride 0.9 % bolus 1,000 mL (1,000 mLs Intravenous New Bag/Given 05/27/19 1207)     Initial Impression / Assessment and Plan / ED Course  I have reviewed the triage vital signs and the nursing notes.  Pertinent labs & imaging results that were available during my care of the patient  were reviewed by me and considered in my medical decision making (see chart for details).     Patient with a past medical history of anxiety, vasovagal disorder, last wore a heart monitor for cardiology a few months ago, was found to be in normal sinus rhythm along with no arrhythmias.  Reports she left this morning to go to grab a ginger ale at the door without having drinking any water or taking any medication.  Also endorses some left-sided chest pain radiating to her back with some tingling on her left arm.  Primary evaluation appears with patient in no distress, no shortness of breath, no tachycardia on arrival.  No neurological weakness low suspicion for any dissection.  CMP showed no electrolyte derangement, creatinine level is within normal limits.  LFTs are unremarkable.  CBC showed no leukocytosis, hemoglobin slightly decreased at 10.7.  First troponin was negative, a second troponin was also obtained which was unremarkable.  EKG showed no changes with infarct or STEMI.  Chest x-ray showed no consolidation, pneumothorax, pleural effusion.  Low suspicion for any ACS with 2 unremarkable troponins.  Patient was provided with a liter of fluids after she was found to be orthostatic, reports feeling better at this time.  Reports this is a recurrent episode for her, she has had double episodes of vasovagal in the past which is why she wore a cardiac monitor back in April 2020.    She is to follow-up with PCP, return to the ED if any of her symptoms change, vitals within normal limits, afebrile, without any complaints.  Unable to obtain UA at this time, she denies any dysuria, hematuria, urgency, frequency.  Patient is requesting discharge home at this time.  Return precautions provided at length.   Portions of this note were generated with Lobbyist. Dictation errors may occur despite best attempts at proofreading.    Final Clinical Impressions(s) / ED Diagnoses   Final  diagnoses:  Vasovagal near syncope    ED Discharge Orders    None       Janeece Fitting, [place] 05/27/19 1324  Loren RacerYelverton, David, MD 05/30/19 1659

## 2019-05-27 NOTE — ED Notes (Signed)
Patient Alert and oriented to baseline. Stable and ambulatory to baseline. Patient verbalized understanding of the discharge instructions.  Patient belongings were taken by the patient.   

## 2019-05-27 NOTE — Discharge Instructions (Addendum)
Your laboratory results were within normal limits today, please follow-up with your primary care physician as needed.  If you experience any chest pain, shortness of breath, worsening symptoms please return to the emergency department for reevaluation.

## 2019-05-27 NOTE — ED Notes (Signed)
Patient transported to X-ray 

## 2019-05-27 NOTE — ED Notes (Signed)
Pt aware that urine sample is needed, but is unable to provide one at this time 

## 2019-05-27 NOTE — ED Notes (Signed)
Pt again encouraged to provide urine sample. States she can not urinate at this time.

## 2019-05-27 NOTE — ED Triage Notes (Signed)
Pt  Here from home with c/o chest pain and sob , pt states that she ws getting numb while driving to the store and passed out , came to and called her husband , no n/v

## 2019-08-12 ENCOUNTER — Telehealth (INDEPENDENT_AMBULATORY_CARE_PROVIDER_SITE_OTHER): Payer: 59 | Admitting: Internal Medicine

## 2019-08-12 ENCOUNTER — Ambulatory Visit: Payer: 59 | Admitting: Internal Medicine

## 2019-08-12 MED ORDER — CITALOPRAM HYDROBROMIDE 40 MG PO TABS: ORAL_TABLET | ORAL | 11 refills | Status: DC

## 2019-08-12 MED ORDER — CLONAZEPAM 1 MG PO TABS: ORAL_TABLET | ORAL | 0 refills | Status: DC
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
## 2019-09-01 ENCOUNTER — Encounter: Payer: Self-pay | Admitting: Internal Medicine

## 2019-09-01 ENCOUNTER — Other Ambulatory Visit: Payer: Self-pay

## 2019-09-01 ENCOUNTER — Ambulatory Visit (INDEPENDENT_AMBULATORY_CARE_PROVIDER_SITE_OTHER): Payer: 59 | Admitting: Internal Medicine

## 2019-09-01 VITALS — BP 122/78 | HR 80 | Resp 12 | Ht 64.0 in | Wt 283.0 lb

## 2019-09-01 DIAGNOSIS — M25562 Pain in left knee: Secondary | ICD-10-CM | POA: Diagnosis not present

## 2019-09-01 DIAGNOSIS — F4310 Post-traumatic stress disorder, unspecified: Secondary | ICD-10-CM | POA: Diagnosis not present

## 2019-09-01 DIAGNOSIS — F32A Depression, unspecified: Secondary | ICD-10-CM

## 2019-09-01 DIAGNOSIS — F419 Anxiety disorder, unspecified: Secondary | ICD-10-CM

## 2019-09-01 DIAGNOSIS — F410 Panic disorder [episodic paroxysmal anxiety] without agoraphobia: Secondary | ICD-10-CM

## 2019-09-01 DIAGNOSIS — G8929 Other chronic pain: Secondary | ICD-10-CM

## 2019-09-01 DIAGNOSIS — F329 Major depressive disorder, single episode, unspecified: Secondary | ICD-10-CM

## 2019-09-01 MED ORDER — IBUPROFEN 800 MG PO TABS: ORAL_TABLET | ORAL | 1 refills | Status: DC
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
## 2019-09-01 NOTE — Patient Instructions (Signed)
For Sleep: For next 3 nights use Clonazepam as a muscle relaxant at bedtime--take about 9:30 p.m. 1/2 tab Go to bed at 10 p.m. and up at 8 a.m. no matter whether you slept well or not No daytime naps Get outside in nature and go for a walk--work up to 1/2 hour daily to start --okay to go beyond. Stop drinking caffeinated and sugar filled drinks (Coke in particular)  I will talk with Valora Piccolo about treatment of PTSD.  Sleep on your side with hips at 90 degrees and your neck and head supported with pillow between knees.

## 2019-09-02 ENCOUNTER — Telehealth: Payer: Self-pay | Admitting: Internal Medicine

## 2019-09-02 NOTE — Telephone Encounter (Signed)
Dr. Freda Munro from [employer] called asking to speak with Dr. Amil Amen regarding patient and stated it  is nothing urgent when was told if I can assist with something due to Dr. Amil Amen been in  the room with a patient. Dr. Freda Munro can be reached  at [place] please inform Dr. Amil Amen

## 2019-09-02 NOTE — Telephone Encounter (Signed)
To Dr. Mulberry  

## 2019-09-04 NOTE — Telephone Encounter (Signed)
Spoke with him yesterday regarding her work leave request

## 2019-09-07 ENCOUNTER — Ambulatory Visit (HOSPITAL_COMMUNITY)
Admission: EM | Admit: 2019-09-07 | Discharge: 2019-09-07 | Disposition: A | Discharge status: Home / Self Care | Payer: 59 | Attending: Physician Assistant | Admitting: Physician Assistant

## 2019-09-07 ENCOUNTER — Encounter (HOSPITAL_COMMUNITY): Payer: Self-pay

## 2019-09-07 ENCOUNTER — Other Ambulatory Visit: Payer: Self-pay

## 2019-09-07 ENCOUNTER — Ambulatory Visit (INDEPENDENT_AMBULATORY_CARE_PROVIDER_SITE_OTHER): Payer: 59

## 2019-09-07 DIAGNOSIS — S63619A Unspecified sprain of unspecified finger, initial encounter: Secondary | ICD-10-CM | POA: Diagnosis not present

## 2019-09-07 MED ORDER — NAPROXEN 500 MG PO TABS: 500.0000 mg | ORAL_TABLET | Freq: Two times a day (BID) | ORAL | 0 refills | Status: DC

## 2019-09-07 NOTE — Discharge Instructions (Signed)
Wear splint as directed. Follow up with ortho if no improvement in symptoms after 1 week.

## 2019-09-07 NOTE — ED Provider Notes (Signed)
[facility]    CSN: [id number] Arrival date & time: 09/07/19  1314      History   Chief Complaint Chief Complaint  Patient presents with  . Hand Pain    HPI Morgan Molina is a 43 y.o. female.   Patient here concerned with R hand pain x 2 days ago. She states she fell after "passing out" and landed on R hand.  She states she gets "vasovagel syncope a lot" and this happens "all the time."  She states she feels fine except for her hand.  She denies head injury, vision changes, CP, LE edema, SOB, nausea/vomiting.  She states R hand is painful over 5th digit.  Admits swelling, RROM, denies ecchymosis, n/t.  She has finger buddy taped to ring finger which is not helping.  She is taking 800 mg ibuprofen which is not helping.       Past Medical History:  Diagnosis Date  . Anxiety   . Depression   . Hypertension     Patient Active Problem List   Diagnosis Date Noted  . Nipple discharge 04/02/2019  . Panic attacks 02/19/2019  . Bilateral low back pain 02/19/2019  . Seasonal allergies 02/19/2019  . Anxiety and depression 01/14/2019  . Syncope 01/14/2019  . Breast mass, right 01/14/2019  . Hypertension     Past Surgical History:  Procedure Laterality Date  . BREAST EXCISIONAL BIOPSY Right   . CHOLECYSTECTOMY    . TUBAL LIGATION      OB History   No obstetric history on file.      Home Medications    Prior to Admission medications   Medication Sig Start Date End Date Taking? Authorizing Provider  citalopram (CELEXA) 40 MG tablet 1 tab by mouth daily 08/12/19   Julieanne MansonMulberry, Elizabeth, MD  clonazePAM Scarlette Calico(KLONOPIN) 1 MG tablet 1/2 to 1 tab by mouth twice daily as needed 08/12/19   Julieanne MansonMulberry, Elizabeth, MD  fexofenadine (ALLEGRA) 180 MG tablet Take 1 tablet (180 mg total) by mouth daily. Patient not taking: Reported on 04/02/2019 02/19/19   Julieanne MansonMulberry, Elizabeth, MD  irbesartan-hydrochlorothiazide (AVALIDE) 150-12.5 MG tablet Take 1 tablet by mouth daily. 11/11/18    Julieanne MansonMulberry, Elizabeth, MD  naproxen (NAPROSYN) 500 MG tablet Take 1 tablet (500 mg total) by mouth 2 (two) times daily. 09/07/19   Evern CoreLindquist, Kateri Balch, PA-C    Family History Family History  Problem Relation Age of Onset  . Cancer Mother   . Hypertension Mother   . Heart attack Mother   . Breast cancer Mother [id number]  . CAD Father   . Hypertension Father   . Diabetes Father     Social History Social History   Tobacco Use  . Smoking status: Current Every Day Smoker  . Smokeless tobacco: Never Used  Substance Use Topics  . Alcohol use: No  . Drug use: No     Allergies   Lisinopril and Adhesive [tape]   Review of Systems Review of Systems  Constitutional: Negative for appetite change, diaphoresis and fatigue.  Respiratory: Negative for chest tightness and shortness of breath.   Cardiovascular: Negative for chest pain, palpitations and leg swelling.  Gastrointestinal: Negative for nausea and vomiting.  Musculoskeletal: Positive for joint swelling. Negative for arthralgias, gait problem and neck stiffness.  Skin: Negative for color change, rash and wound.  Neurological: Negative for dizziness, facial asymmetry, weakness, light-headedness, numbness and headaches.  Hematological: Does not bruise/bleed easily.  Psychiatric/Behavioral: Negative for behavioral problems and sleep disturbance.  Physical Exam Triage Vital Signs ED Triage Vitals  Enc Vitals Group     BP 09/07/19 1345 127/84     Pulse Rate 09/07/19 1345 100     Resp 09/07/19 1345 18     Temp 09/07/19 1345 98.2 F (36.8 C)     Temp Source 09/07/19 1345 Oral     SpO2 09/07/19 1345 100 %     Weight 09/07/19 1340 284 lb (128.8 kg)     Height --      Head Circumference --      Peak Flow --      Pain Score 09/07/19 1340 10     Pain Loc --      Pain Edu? --      Excl. in [place]? --    No data found.  Updated Vital Signs BP 127/84   Pulse 100   Temp 98.2 F (36.8 C) (Oral)   Resp 18   Wt 284 lb (128.8 kg)   LMP  08/25/2019   SpO2 100%   BMI 48.75 kg/m   Visual Acuity Right Eye Distance:   Left Eye Distance:   Bilateral Distance:    Right Eye Near:   Left Eye Near:    Bilateral Near:     Physical Exam Vitals signs and nursing note reviewed.  Constitutional:      General: She is not in acute distress.    Appearance: She is well-developed. She is not ill-appearing, toxic-appearing or diaphoretic.  HENT:     Head: Normocephalic and atraumatic.  Eyes:     General: No scleral icterus.    Extraocular Movements: Extraocular movements intact.     Conjunctiva/sclera: Conjunctivae normal.     Pupils: Pupils are equal, round, and reactive to light.  Neck:     Musculoskeletal: Normal range of motion and neck supple. No neck rigidity.  Cardiovascular:     Rate and Rhythm: Normal rate and regular rhythm.     Heart sounds: No murmur.  Pulmonary:     Effort: Pulmonary effort is normal. No respiratory distress.     Breath sounds: Normal breath sounds.  Musculoskeletal:        General: Swelling, tenderness and signs of injury present. No deformity.     Right hand: She exhibits decreased range of motion (flexion and extesion reduced at MCP and PIP), tenderness, bony tenderness and swelling (over distal 5th metacarpal and 5th proximal phalanx). She exhibits no deformity. Normal sensation noted. Normal strength noted.       Hands:  Skin:    General: Skin is warm and dry.     Capillary Refill: Capillary refill takes less than 2 seconds.  Neurological:     General: No focal deficit present.     Mental Status: She is alert and oriented to person, place, and time.     Cranial Nerves: Cranial nerves are intact. No cranial nerve deficit or facial asymmetry.     Sensory: Sensation is intact.     Motor: Motor function is intact. No weakness or abnormal muscle tone.     Gait: Gait is intact.  Psychiatric:        Mood and Affect: Mood normal.        Behavior: Behavior normal.      UC Treatments /  Results  Labs (all labs ordered are listed, but only abnormal results are displayed) Labs Reviewed - No data to display  EKG   Radiology Dg Hand Complete Right  Result Date: 09/07/2019 CLINICAL  DATA:  Pain after fall. EXAM: RIGHT HAND - COMPLETE 3+ VIEW COMPARISON:  None. FINDINGS: There is no evidence of fracture or dislocation. There is no evidence of arthropathy or other focal bone abnormality. Soft tissues are unremarkable. IMPRESSION: Negative. Electronically Signed   By: Dorise Bullion III M.D   On: 09/07/2019 15:00    Procedures Procedures (including critical care time)  Medications Ordered in UC Medications - No data to display  Initial Impression / Assessment and Plan / UC Course  I have reviewed the triage vital signs and the nursing notes.  Pertinent labs & imaging results that were available during my care of the patient were reviewed by me and considered in my medical decision making (see chart for details).      Final Clinical Impressions(s) / UC Diagnoses   Final diagnoses:  Sprain of finger, unspecified finger, initial encounter     Discharge Instructions     Wear splint as directed. Follow up with ortho if no improvement in symptoms after 1 week.    ED Prescriptions    Medication Sig Dispense Auth. Provider   naproxen (NAPROSYN) 500 MG tablet Take 1 tablet (500 mg total) by mouth 2 (two) times daily. 30 tablet Peri Jefferson, PA-C     PDMP not reviewed this encounter.   Peri Jefferson, PA-C 09/07/19 1539

## 2019-09-07 NOTE — ED Triage Notes (Signed)
Pt states she passed out on Friday. Pt states she injured her right hand

## 2019-09-16 ENCOUNTER — Ambulatory Visit: Payer: 59 | Admitting: Internal Medicine

## 2019-09-28 ENCOUNTER — Encounter: Payer: Self-pay | Admitting: Internal Medicine

## 2019-09-28 DIAGNOSIS — F4310 Post-traumatic stress disorder, unspecified: Secondary | ICD-10-CM

## 2019-09-28 HISTORY — DX: Post-traumatic stress disorder, unspecified: F43.10

## 2019-10-01 ENCOUNTER — Other Ambulatory Visit: Payer: Self-pay

## 2019-10-01 ENCOUNTER — Encounter: Payer: Self-pay | Admitting: Internal Medicine

## 2019-10-01 ENCOUNTER — Ambulatory Visit (INDEPENDENT_AMBULATORY_CARE_PROVIDER_SITE_OTHER): Payer: 59 | Admitting: Internal Medicine

## 2019-10-01 VITALS — BP 124/80 | HR 82 | Resp 12 | Ht 64.0 in | Wt 281.0 lb

## 2019-10-01 DIAGNOSIS — M67432 Ganglion, left wrist: Secondary | ICD-10-CM | POA: Insufficient documentation

## 2019-10-01 DIAGNOSIS — H547 Unspecified visual loss: Secondary | ICD-10-CM | POA: Diagnosis not present

## 2019-10-01 DIAGNOSIS — M79641 Pain in right hand: Secondary | ICD-10-CM

## 2019-10-01 DIAGNOSIS — F419 Anxiety disorder, unspecified: Secondary | ICD-10-CM

## 2019-10-01 DIAGNOSIS — F4310 Post-traumatic stress disorder, unspecified: Secondary | ICD-10-CM | POA: Diagnosis not present

## 2019-10-01 DIAGNOSIS — F329 Major depressive disorder, single episode, unspecified: Secondary | ICD-10-CM

## 2019-10-01 DIAGNOSIS — F410 Panic disorder [episodic paroxysmal anxiety] without agoraphobia: Secondary | ICD-10-CM

## 2019-10-01 NOTE — Progress Notes (Signed)
    Subjective:    Patient ID: Morgan Molina, female   DOB: [birth year], 43 y.o.   MRN: [id number]   HPI   1.  PTSD/depression/anxiety:  She ultimately has decided to return to work on December the 1st.  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  She continues to look for another job. She has not had a visit with Valora Piccolo since our last visit as she had to cancel the last session with her. Discussed Ms. Lunette Stands is planning to do some visualization work with her--to visualize her past [place] and separate herself from it. Discussed if does not help from her perspective, either Ms. Lunette Stands or I can send her to someone who does EMDR. She weaned herself back to 10 mg of Citalopram and notes no difference in how she feels.   2.  [place] and caught her weight on her palmar flexed wrist--landed on the dorsal wrist basically.  Had negative xrays, but continues to have mild discomfort along ulnar 5th Metacarpal. Also notes bump on left dorsal wrist to have enlarged and now affecting function of wrist/hand.  3.  Vision with sense of eye movement after exposed to light/glare.  Difficulties more so at night with glare from approaching car lights or about street lights.   Vision blurry in general.  Current Meds  Medication Sig  . citalopram (CELEXA) 40 MG tablet 1 tab by mouth daily  . clonazePAM (KLONOPIN) 1 MG tablet 1/2 to 1 tab by mouth twice daily as needed  . irbesartan-hydrochlorothiazide (AVALIDE) 150-12.5 MG tablet Take 1 tablet by mouth daily.   Allergies  Allergen Reactions  . Lisinopril Cough  . Adhesive [Tape] Rash     Review of Systems    Objective:   BP 124/80 (BP Location: Left Arm, Patient Position: Sitting, Cuff Size: Large)   Pulse 82   Resp 12   Ht 5\' 4"  (1.626 m)   Wt 281 lb (127.5 kg)   LMP 09/19/2019   BMI  48.23 kg/m   Physical Exam   NAD HEENT:  PERRL, EOMI, no obvious cataracts.  Discs appear sharp Chest:  CTA CV:  RRR without murmur or rub.  Radial pulses normal and equal Right 5th MTP without deformity, swelling or significant tenderness.  No crepitation.  Able to make a fist and fully extend all fingers. Left dorsal wrist with 2 cm cystic lesion.  Mildly tender.   Assessment & Plan   1.  PTSD/depression/anxiety/panic attacks:  No worse back on 10 mg of Citalopram.  2.  Ganglion cyst left dorsar wrist and right 5th MC pain:  Referral to hand surgery.  3.   Visual complaints:  Referral to Dr. Katy Fitch, ophthalmology.  No obvious concerns on exam.     1 hour with patient face to face today. Refuses influenza vaccine

## 2019-10-18 ENCOUNTER — Ambulatory Visit (HOSPITAL_COMMUNITY)
Admission: EM | Admit: 2019-10-18 | Discharge: 2019-10-18 | Disposition: A | Discharge status: Home / Self Care | Payer: 59 | Attending: Emergency Medicine | Admitting: Emergency Medicine

## 2019-10-18 ENCOUNTER — Other Ambulatory Visit: Payer: Self-pay

## 2019-10-18 ENCOUNTER — Encounter (HOSPITAL_COMMUNITY): Payer: Self-pay

## 2019-10-18 DIAGNOSIS — L0291 Cutaneous abscess, unspecified: Secondary | ICD-10-CM | POA: Diagnosis not present

## 2019-10-18 MED ORDER — CEPHALEXIN 500 MG PO CAPS: 500.0000 mg | ORAL_CAPSULE | Freq: Four times a day (QID) | ORAL | 0 refills | Status: AC

## 2019-10-18 NOTE — Discharge Instructions (Signed)
Warm compresses/ or soaks, 3-4 times a day, today and tomorrow especially, to promote drainage.  Cleanse daily with soap and water.  Complete course of antibiotics.   If symptoms worsen or do not improve in the next week to return to be seen or to follow up with your PCP.

## 2019-10-18 NOTE — ED Triage Notes (Signed)
Pt presents with abscess in right groin area X 1 week. 

## 2019-10-19 NOTE — ED Provider Notes (Signed)
[facility]    CSN: [id number] Arrival date & time: 10/18/19  1027      History   Chief Complaint Chief Complaint  Patient presents with  . Abscess    HPI Morgan Molina is a 43 y.o. female.   Morgan Molina presents with complaints of abscess to right low abdomen/ pubis which has increased size in the past week. She suspects that it may have started draining since waiting to be seen, but previously hadn't been draining. No fevers. Has been taking 800mg  ibuprofen which has helped. Has had the same in the past to the same location. No known MRSA. History of anxiety, depression, htn.    ROS per HPI, negative if not otherwise mentioned.      Past Medical History:  Diagnosis Date  . Anxiety   . Depression   . Hypertension   . PTSD (post-traumatic stress disorder) 09/28/2019    Patient Active Problem List   Diagnosis Date Noted  . Ganglion cyst of dorsum of left wrist 10/01/2019  . Decreased visual acuity 10/01/2019  . PTSD (post-traumatic stress disorder) 09/28/2019  . Nipple discharge 04/02/2019  . Panic attacks 02/19/2019  . Bilateral low back pain 02/19/2019  . Seasonal allergies 02/19/2019  . Anxiety and depression 01/14/2019  . Syncope 01/14/2019  . Breast mass, right 01/14/2019  . Hypertension     Past Surgical History:  Procedure Laterality Date  . BREAST EXCISIONAL BIOPSY Right   . CHOLECYSTECTOMY    . TUBAL LIGATION      OB History   No obstetric history on file.      Home Medications    Prior to Admission medications   Medication Sig Start Date End Date Taking? Authorizing Provider  cephALEXin (KEFLEX) 500 MG capsule Take 1 capsule (500 mg total) by mouth 4 (four) times daily for 7 days. 10/18/19 10/25/19  10/27/19, NP  citalopram (CELEXA) 40 MG tablet 1 tab by mouth daily 08/12/19   10/12/19, MD  clonazePAM Julieanne Manson) 1 MG tablet 1/2 to 1 tab by mouth twice daily as needed 08/12/19   10/12/19, MD   fexofenadine (ALLEGRA) 180 MG tablet Take 1 tablet (180 mg total) by mouth daily. Patient not taking: Reported on 04/02/2019 02/19/19   02/21/19, MD  irbesartan-hydrochlorothiazide (AVALIDE) 150-12.5 MG tablet Take 1 tablet by mouth daily. 11/11/18   01/10/19, MD  naproxen (NAPROSYN) 500 MG tablet Take 1 tablet (500 mg total) by mouth 2 (two) times daily. Patient not taking: Reported on 10/01/2019 09/07/19   13/1/20, PA-C    Family History Family History  Problem Relation Age of Onset  . Cancer Mother   . Hypertension Mother   . Heart attack Mother   . Breast cancer Mother 64  . CAD Father   . Hypertension Father   . Diabetes Father     Social History Social History   Tobacco Use  . Smoking status: Current Every Day Smoker  . Smokeless tobacco: Never Used  Substance Use Topics  . Alcohol use: No  . Drug use: No     Allergies   Lisinopril and Adhesive [tape]   Review of Systems Review of Systems   Physical Exam Triage Vital Signs ED Triage Vitals  Enc Vitals Group     BP 10/18/19 1115 100/69     Pulse Rate 10/18/19 1115 87     Resp 10/18/19 1115 18     Temp 10/18/19 1115 98.9 F (37.2 C)  Temp Source 10/18/19 1115 Oral     SpO2 10/18/19 1115 98 %     Weight --      Height --      Head Circumference --      Peak Flow --      Pain Score 10/18/19 1116 10     Pain Loc --      Pain Edu? --      Excl. in [place]? --    No data found.  Updated Vital Signs BP 100/69 (BP Location: Left Arm)   Pulse 87   Temp 98.9 F (37.2 C) (Oral)   Resp 18   LMP 09/19/2019   SpO2 98%   Visual Acuity Right Eye Distance:   Left Eye Distance:   Bilateral Distance:    Right Eye Near:   Left Eye Near:    Bilateral Near:     Physical Exam Constitutional:      General: She is not in acute distress.    Appearance: She is well-developed.  Cardiovascular:     Rate and Rhythm: Normal rate.  Pulmonary:     Effort: Pulmonary effort is normal.   Skin:    General: Skin is warm and dry.     Findings: Abscess present.          Comments: Approximately 1 cm raised fluctuant abscess with significant purulent drainage, with minimal pressure application significant drainage; mild firmness to skin surrounding   Neurological:     Mental Status: She is alert and oriented to person, place, and time.      UC Treatments / Results  Labs (all labs ordered are listed, but only abnormal results are displayed) Labs Reviewed - No data to display  EKG   Radiology No results found.  Procedures Procedures (including critical care time)  Medications Ordered in UC Medications - No data to display  Initial Impression / Assessment and Plan / UC Course  I have reviewed the triage vital signs and the nursing notes.  Pertinent labs & imaging results that were available during my care of the patient were reviewed by me and considered in my medical decision making (see chart for details).     Actively draining abscess. No indication for further incision after able to apply pressure and decompress significantly. Warm compresses, antibiotics provided. Patient verbalized understanding and agreeable to plan.   Final Clinical Impressions(s) / UC Diagnoses   Final diagnoses:  Abscess     Discharge Instructions     Warm compresses/ or soaks, 3-4 times a day, today and tomorrow especially, to promote drainage.  Cleanse daily with soap and water.  Complete course of antibiotics.   If symptoms worsen or do not improve in the next week to return to be seen or to follow up with your PCP.      ED Prescriptions    Medication Sig Dispense Auth. Provider   cephALEXin (KEFLEX) 500 MG capsule Take 1 capsule (500 mg total) by mouth 4 (four) times daily for 7 days. 28 capsule Zigmund Gottron, NP     PDMP not reviewed this encounter.   Zigmund Gottron, NP 10/19/19 1019

## 2019-11-12 ENCOUNTER — Other Ambulatory Visit: Payer: Self-pay | Admitting: Internal Medicine

## 2019-12-01 ENCOUNTER — Other Ambulatory Visit: Payer: Self-pay | Admitting: Internal Medicine

## 2019-12-05 ENCOUNTER — Encounter: Payer: Self-pay | Admitting: Neurology

## 2019-12-05 DIAGNOSIS — G5602 Carpal tunnel syndrome, left upper limb: Secondary | ICD-10-CM | POA: Insufficient documentation

## 2019-12-08 ENCOUNTER — Other Ambulatory Visit: Payer: Self-pay

## 2019-12-08 DIAGNOSIS — R202 Paresthesia of skin: Secondary | ICD-10-CM

## 2019-12-23 ENCOUNTER — Encounter: Payer: 59 | Admitting: Neurology

## 2019-12-30 DIAGNOSIS — I10 Essential (primary) hypertension: Secondary | ICD-10-CM | POA: Insufficient documentation

## 2019-12-31 ENCOUNTER — Other Ambulatory Visit: Payer: Self-pay | Admitting: Internal Medicine

## 2020-01-02 ENCOUNTER — Encounter: Payer: Self-pay | Admitting: Internal Medicine

## 2020-01-16 ENCOUNTER — Encounter: Payer: Self-pay | Admitting: Internal Medicine

## 2020-02-05 ENCOUNTER — Other Ambulatory Visit: Payer: Self-pay | Admitting: Orthopedic Surgery

## 2020-02-12 ENCOUNTER — Other Ambulatory Visit: Payer: Self-pay

## 2020-02-12 ENCOUNTER — Encounter (HOSPITAL_BASED_OUTPATIENT_CLINIC_OR_DEPARTMENT_OTHER): Payer: Self-pay | Admitting: Orthopedic Surgery

## 2020-02-17 ENCOUNTER — Encounter (HOSPITAL_BASED_OUTPATIENT_CLINIC_OR_DEPARTMENT_OTHER)
Admission: RE | Admit: 2020-02-17 | Discharge: 2020-02-17 | Disposition: A | Discharge status: Home / Self Care | Payer: BC Managed Care – PPO | Source: Ambulatory Visit | Attending: Orthopedic Surgery | Admitting: Orthopedic Surgery

## 2020-02-17 ENCOUNTER — Other Ambulatory Visit (HOSPITAL_COMMUNITY)
Admission: RE | Admit: 2020-02-17 | Discharge: 2020-02-17 | Disposition: A | Discharge status: Home / Self Care | Payer: BC Managed Care – PPO | Source: Ambulatory Visit | Attending: Orthopedic Surgery | Admitting: Orthopedic Surgery

## 2020-02-17 DIAGNOSIS — Z20822 Contact with and (suspected) exposure to covid-19: Secondary | ICD-10-CM | POA: Insufficient documentation

## 2020-02-17 DIAGNOSIS — Z01812 Encounter for preprocedural laboratory examination: Secondary | ICD-10-CM | POA: Insufficient documentation

## 2020-02-17 LAB — BASIC METABOLIC PANEL
Anion gap: 9 (ref 5–15)
BUN: 7 mg/dL (ref 6–20)
CO2: 26 mmol/L (ref 22–32)
Calcium: 8.8 mg/dL — ABNORMAL LOW (ref 8.9–10.3)
Chloride: 104 mmol/L (ref 98–111)
Creatinine, Ser: 0.76 mg/dL (ref 0.44–1.00)
GFR calc Af Amer: >60 mL/min (ref >60)
GFR calc non Af Amer: >60 mL/min (ref >60)
Glucose, Bld: 115 mg/dL — ABNORMAL HIGH (ref 70–99)
Potassium: 3.7 mmol/L (ref 3.5–5.1)
Sodium: 139 mmol/L (ref 135–145)

## 2020-02-17 LAB — SARS CORONAVIRUS 2 (TAT 6-24 HRS): SARS Coronavirus 2: NEGATIVE

## 2020-02-17 NOTE — Progress Notes (Signed)
Pt seen by Dr. Stephannie Peters for anesthesia consult; will proceed with scheduled surgery.

## 2020-02-17 NOTE — Progress Notes (Signed)

## 2020-02-17 NOTE — Anesthesia Preprocedure Evaluation (Addendum)
Anesthesia Evaluation  Patient identified by MRN, date of birth, ID band Patient awake    Reviewed: Allergy & Precautions, NPO status , Patient's Chart, lab work & pertinent test results  History of Anesthesia Complications Negative for: history of anesthetic complications  Airway Mallampati: II  TM Distance: >3 FB Neck ROM: Full    Dental  (+) Missing,    Pulmonary neg pulmonary ROS, Current Smoker,    Pulmonary exam normal        Cardiovascular hypertension, Pt. on medications Normal cardiovascular exam     Neuro/Psych Anxiety Depression negative neurological ROS  negative psych ROS   GI/Hepatic negative GI ROS, Neg liver ROS,   Endo/Other  Morbid obesity  Renal/GU negative Renal ROS  negative genitourinary   Musculoskeletal negative musculoskeletal ROS (+) LEFT CARPAL TUNNEL SYNDROME. LEFT WRIST DORSAL GANGLION   Abdominal   Peds negative pediatric ROS (+)  Hematology negative hematology ROS (+)   Anesthesia Other Findings Day of surgery medications reviewed with patient.  Reproductive/Obstetrics negative OB ROS                            Anesthesia Physical Anesthesia Plan  ASA: III  Anesthesia Plan: General   Post-op Pain Management:    Induction: Intravenous  PONV Risk Score and Plan: 2 and Ondansetron, Dexamethasone and Treatment may vary due to age or medical condition  Airway Management Planned: LMA  Additional Equipment:   Intra-op Plan:   Post-operative Plan: Extubation in OR  Informed Consent: I have reviewed the patients History and Physical, chart, labs and discussed the procedure including the risks, benefits and alternatives for the proposed anesthesia with the patient or authorized representative who has indicated his/her understanding and acceptance.     Dental advisory given  Plan Discussed with: CRNA and Surgeon  Anesthesia Plan Comments: (Patient  seen as preop anesthesia consult for elevated BMI. Reassuring airway as documented. Regional IV anesthesia discussed briefly but patient informed that anesthetic plan would ultimately be determined by anesthesiologist on day of surgery. All questions and concerns addressed to patient's satisfaction. Dereck Ligas, MD)       Anesthesia Quick Evaluation

## 2020-02-20 ENCOUNTER — Encounter (HOSPITAL_BASED_OUTPATIENT_CLINIC_OR_DEPARTMENT_OTHER)
Admission: RE | Disposition: A | Discharge status: Home / Self Care | Payer: Self-pay | Source: Home / Self Care | Attending: Orthopedic Surgery

## 2020-02-20 ENCOUNTER — Other Ambulatory Visit: Payer: Self-pay

## 2020-02-20 ENCOUNTER — Ambulatory Visit (HOSPITAL_BASED_OUTPATIENT_CLINIC_OR_DEPARTMENT_OTHER): Payer: BC Managed Care – PPO | Admitting: Anesthesiology

## 2020-02-20 ENCOUNTER — Ambulatory Visit (HOSPITAL_BASED_OUTPATIENT_CLINIC_OR_DEPARTMENT_OTHER)
Admission: RE | Admit: 2020-02-20 | Discharge: 2020-02-20 | Disposition: A | Discharge status: Home / Self Care | Payer: BC Managed Care – PPO | Attending: Orthopedic Surgery | Admitting: Orthopedic Surgery

## 2020-02-20 ENCOUNTER — Encounter (HOSPITAL_BASED_OUTPATIENT_CLINIC_OR_DEPARTMENT_OTHER): Payer: Self-pay | Admitting: Orthopedic Surgery

## 2020-02-20 DIAGNOSIS — Z803 Family history of malignant neoplasm of breast: Secondary | ICD-10-CM | POA: Diagnosis not present

## 2020-02-20 DIAGNOSIS — Z6841 Body Mass Index (BMI) 40.0 and over, adult: Secondary | ICD-10-CM | POA: Diagnosis not present

## 2020-02-20 DIAGNOSIS — Z79899 Other long term (current) drug therapy: Secondary | ICD-10-CM | POA: Insufficient documentation

## 2020-02-20 DIAGNOSIS — F329 Major depressive disorder, single episode, unspecified: Secondary | ICD-10-CM | POA: Insufficient documentation

## 2020-02-20 DIAGNOSIS — G5602 Carpal tunnel syndrome, left upper limb: Secondary | ICD-10-CM | POA: Diagnosis present

## 2020-02-20 DIAGNOSIS — F4310 Post-traumatic stress disorder, unspecified: Secondary | ICD-10-CM | POA: Insufficient documentation

## 2020-02-20 DIAGNOSIS — F17210 Nicotine dependence, cigarettes, uncomplicated: Secondary | ICD-10-CM | POA: Insufficient documentation

## 2020-02-20 DIAGNOSIS — F419 Anxiety disorder, unspecified: Secondary | ICD-10-CM | POA: Diagnosis not present

## 2020-02-20 DIAGNOSIS — Z888 Allergy status to other drugs, medicaments and biological substances status: Secondary | ICD-10-CM | POA: Diagnosis not present

## 2020-02-20 DIAGNOSIS — Z809 Family history of malignant neoplasm, unspecified: Secondary | ICD-10-CM | POA: Diagnosis not present

## 2020-02-20 DIAGNOSIS — I10 Essential (primary) hypertension: Secondary | ICD-10-CM | POA: Diagnosis not present

## 2020-02-20 DIAGNOSIS — Z9049 Acquired absence of other specified parts of digestive tract: Secondary | ICD-10-CM | POA: Insufficient documentation

## 2020-02-20 DIAGNOSIS — Z91040 Latex allergy status: Secondary | ICD-10-CM | POA: Diagnosis not present

## 2020-02-20 DIAGNOSIS — F17200 Nicotine dependence, unspecified, uncomplicated: Secondary | ICD-10-CM | POA: Insufficient documentation

## 2020-02-20 DIAGNOSIS — Z91048 Other nonmedicinal substance allergy status: Secondary | ICD-10-CM | POA: Diagnosis not present

## 2020-02-20 DIAGNOSIS — Z833 Family history of diabetes mellitus: Secondary | ICD-10-CM | POA: Insufficient documentation

## 2020-02-20 DIAGNOSIS — M67432 Ganglion, left wrist: Secondary | ICD-10-CM | POA: Diagnosis not present

## 2020-02-20 DIAGNOSIS — Z8249 Family history of ischemic heart disease and other diseases of the circulatory system: Secondary | ICD-10-CM | POA: Insufficient documentation

## 2020-02-20 HISTORY — PX: GANGLION CYST EXCISION: SHX1691

## 2020-02-20 HISTORY — PX: CARPAL TUNNEL RELEASE: SHX101

## 2020-02-20 LAB — POCT PREGNANCY, URINE: Preg Test, Ur: NEGATIVE

## 2020-02-20 SURGERY — CARPAL TUNNEL RELEASE
Anesthesia: General | Site: Wrist | Laterality: Left

## 2020-02-20 MED ORDER — KETOROLAC TROMETHAMINE 30 MG/ML IJ SOLN
30.0000 mg | Freq: Once | INTRAMUSCULAR | Status: AC | PRN
  Administered 2020-02-20: 30 mg via INTRAVENOUS

## 2020-02-20 MED ORDER — ONDANSETRON HCL 4 MG/2ML IJ SOLN: 4.0000 mg | Freq: Once | INTRAMUSCULAR | Status: DC | PRN

## 2020-02-20 MED ORDER — CEFAZOLIN SODIUM-DEXTROSE 2-4 GM/100ML-% IV SOLN
INTRAVENOUS | Status: AC
  Filled 2020-02-20: qty 100

## 2020-02-20 MED ORDER — FENTANYL CITRATE (PF) 100 MCG/2ML IJ SOLN
25.0000 ug | INTRAMUSCULAR | Status: DC | PRN
  Administered 2020-02-20 (×3): 50 ug via INTRAVENOUS

## 2020-02-20 MED ORDER — FENTANYL CITRATE (PF) 100 MCG/2ML IJ SOLN
INTRAMUSCULAR | Status: AC
  Filled 2020-02-20: qty 2

## 2020-02-20 MED ORDER — BUPIVACAINE HCL (PF) 0.25 % IJ SOLN
INTRAMUSCULAR | Status: DC | PRN
  Administered 2020-02-20: 11 mL

## 2020-02-20 MED ORDER — OXYCODONE HCL 5 MG PO TABS
5.0000 mg | ORAL_TABLET | Freq: Once | ORAL | Status: AC
  Administered 2020-02-20: 5 mg via ORAL

## 2020-02-20 MED ORDER — DEXAMETHASONE SODIUM PHOSPHATE 10 MG/ML IJ SOLN
INTRAMUSCULAR | Status: DC | PRN
  Administered 2020-02-20: 10 mg via INTRAVENOUS

## 2020-02-20 MED ORDER — ONDANSETRON HCL 4 MG/2ML IJ SOLN
INTRAMUSCULAR | Status: DC | PRN
  Administered 2020-02-20: 4 mg via INTRAVENOUS

## 2020-02-20 MED ORDER — LIDOCAINE 2% (20 MG/ML) 5 ML SYRINGE
INTRAMUSCULAR | Status: DC | PRN
  Administered 2020-02-20: 80 mg via INTRAVENOUS

## 2020-02-20 MED ORDER — PROPOFOL 10 MG/ML IV BOLUS
INTRAVENOUS | Status: DC | PRN
  Administered 2020-02-20: 200 mg via INTRAVENOUS
  Administered 2020-02-20: 50 mg via INTRAVENOUS

## 2020-02-20 MED ORDER — LACTATED RINGERS IV SOLN: INTRAVENOUS | Status: DC

## 2020-02-20 MED ORDER — CEFAZOLIN SODIUM-DEXTROSE 2-4 GM/100ML-% IV SOLN
2.0000 g | INTRAVENOUS | Status: AC
  Administered 2020-02-20: 13:00:00 3 g via INTRAVENOUS

## 2020-02-20 MED ORDER — HYDROCODONE-ACETAMINOPHEN 5-325 MG PO TABS: ORAL_TABLET | ORAL | 0 refills | Status: DC

## 2020-02-20 MED ORDER — OXYCODONE HCL 5 MG PO TABS
ORAL_TABLET | ORAL | Status: AC
  Filled 2020-02-20: qty 1

## 2020-02-20 MED ORDER — FENTANYL CITRATE (PF) 100 MCG/2ML IJ SOLN
INTRAMUSCULAR | Status: DC | PRN
  Administered 2020-02-20 (×4): 50 ug via INTRAVENOUS

## 2020-02-20 MED ORDER — KETOROLAC TROMETHAMINE 30 MG/ML IJ SOLN
INTRAMUSCULAR | Status: AC
  Filled 2020-02-20: qty 1

## 2020-02-20 MED ORDER — MIDAZOLAM HCL 5 MG/5ML IJ SOLN
INTRAMUSCULAR | Status: DC | PRN
  Administered 2020-02-20: 2 mg via INTRAVENOUS

## 2020-02-20 MED ORDER — CEFAZOLIN SODIUM 1 G IJ SOLR
INTRAMUSCULAR | Status: AC
  Filled 2020-02-20: qty 30

## 2020-02-20 MED ORDER — MIDAZOLAM HCL 2 MG/2ML IJ SOLN
INTRAMUSCULAR | Status: AC
  Filled 2020-02-20: qty 2

## 2020-02-20 SURGICAL SUPPLY — 54 items
BENZOIN TINCTURE PRP APPL 2/3 (GAUZE/BANDAGES/DRESSINGS) ×4 IMPLANT
BLADE MINI RND TIP GREEN BEAV (BLADE) IMPLANT
BLADE SURG 15 STRL LF DISP TIS (BLADE) ×4 IMPLANT
BLADE SURG 15 STRL SS (BLADE) ×4
BNDG ELASTIC 2X5.8 VLCR STR LF (GAUZE/BANDAGES/DRESSINGS) IMPLANT
BNDG ELASTIC 3X5.8 VLCR STR LF (GAUZE/BANDAGES/DRESSINGS) ×4 IMPLANT
BNDG ESMARK 4X9 LF (GAUZE/BANDAGES/DRESSINGS) ×4 IMPLANT
BNDG GAUZE ELAST 4 BULKY (GAUZE/BANDAGES/DRESSINGS) ×4 IMPLANT
CHLORAPREP W/TINT 26 (MISCELLANEOUS) ×4 IMPLANT
CLOSURE WOUND 1/2 X4 (GAUZE/BANDAGES/DRESSINGS) ×1
CORD BIPOLAR FORCEPS 12FT (ELECTRODE) ×4 IMPLANT
COVER BACK TABLE 60X90IN (DRAPES) ×4 IMPLANT
COVER MAYO STAND STRL (DRAPES) ×4 IMPLANT
COVER WAND RF STERILE (DRAPES) IMPLANT
CUFF TOURN SGL QUICK 18X4 (TOURNIQUET CUFF) IMPLANT
CUFF TOURN SGL QUICK 24 (TOURNIQUET CUFF) ×2
CUFF TRNQT CYL 24X4X16.5-23 (TOURNIQUET CUFF) ×2 IMPLANT
DRAPE EXTREMITY T 121X128X90 (DISPOSABLE) ×4 IMPLANT
DRAPE SURG 17X23 STRL (DRAPES) ×4 IMPLANT
DRSG PAD ABDOMINAL 8X10 ST (GAUZE/BANDAGES/DRESSINGS) ×4 IMPLANT
GAUZE SPONGE 4X4 12PLY STRL (GAUZE/BANDAGES/DRESSINGS) ×4 IMPLANT
GAUZE XEROFORM 1X8 LF (GAUZE/BANDAGES/DRESSINGS) ×4 IMPLANT
GLOVE BIO SURGEON STRL SZ7.5 (GLOVE) IMPLANT
GLOVE BIOGEL PI IND STRL 7.0 (GLOVE) ×2 IMPLANT
GLOVE BIOGEL PI IND STRL 7.5 (GLOVE) ×2 IMPLANT
GLOVE BIOGEL PI IND STRL 8 (GLOVE) ×2 IMPLANT
GLOVE BIOGEL PI INDICATOR 7.0 (GLOVE) ×2
GLOVE BIOGEL PI INDICATOR 7.5 (GLOVE) ×2
GLOVE BIOGEL PI INDICATOR 8 (GLOVE) ×2
GLOVE SURG SS PI 6.5 STRL IVOR (GLOVE) ×4 IMPLANT
GLOVE SURG SS PI 7.0 STRL IVOR (GLOVE) ×4 IMPLANT
GLOVE SURG SS PI 7.5 STRL IVOR (GLOVE) ×4 IMPLANT
GOWN STRL REUS W/ TWL LRG LVL3 (GOWN DISPOSABLE) ×4 IMPLANT
GOWN STRL REUS W/TWL LRG LVL3 (GOWN DISPOSABLE) ×4
GOWN STRL REUS W/TWL XL LVL3 (GOWN DISPOSABLE) ×4 IMPLANT
NEEDLE HYPO 25X1 1.5 SAFETY (NEEDLE) ×4 IMPLANT
NS IRRIG 1000ML POUR BTL (IV SOLUTION) ×4 IMPLANT
PACK BASIN DAY SURGERY FS (CUSTOM PROCEDURE TRAY) ×4 IMPLANT
PAD CAST 3X4 CTTN HI CHSV (CAST SUPPLIES) IMPLANT
PADDING CAST ABS 4INX4YD NS (CAST SUPPLIES)
PADDING CAST ABS COTTON 4X4 ST (CAST SUPPLIES) IMPLANT
PADDING CAST COTTON 3X4 STRL (CAST SUPPLIES)
SPLINT PLASTER CAST XFAST 3X15 (CAST SUPPLIES) IMPLANT
SPLINT PLASTER XTRA FASTSET 3X (CAST SUPPLIES)
STOCKINETTE 4X48 STRL (DRAPES) ×4 IMPLANT
STRIP CLOSURE SKIN 1/2X4 (GAUZE/BANDAGES/DRESSINGS) ×3 IMPLANT
SUT ETHILON 4 0 PS 2 18 (SUTURE) ×4 IMPLANT
SUT MNCRL AB 4-0 PS2 18 (SUTURE) ×4 IMPLANT
SUT MON AB 5-0 PS2 18 (SUTURE) IMPLANT
SUT VIC AB 4-0 P2 18 (SUTURE) ×4 IMPLANT
SYR BULB 3OZ (MISCELLANEOUS) ×4 IMPLANT
SYR CONTROL 10ML LL (SYRINGE) ×4 IMPLANT
TOWEL GREEN STERILE FF (TOWEL DISPOSABLE) ×4 IMPLANT
UNDERPAD 30X36 HEAVY ABSORB (UNDERPADS AND DIAPERS) ×4 IMPLANT

## 2020-02-20 NOTE — Anesthesia Procedure Notes (Signed)
Procedure Name: LMA Insertion Date/Time: 02/20/2020 12:52 PM Performed by: Caren Macadam, CRNA Pre-anesthesia Checklist: Patient identified, Emergency Drugs available, Suction available and Patient being monitored Patient Re-evaluated:Patient Re-evaluated prior to induction Oxygen Delivery Method: Circle system utilized Preoxygenation: Pre-oxygenation with 100% oxygen Induction Type: IV induction Ventilation: Mask ventilation without difficulty LMA: LMA inserted LMA Size: 4.0 Number of attempts: 1 Placement Confirmation: positive ETCO2 and breath sounds checked- equal and bilateral Tube secured with: Tape Dental Injury: Teeth and Oropharynx as per pre-operative assessment

## 2020-02-20 NOTE — Anesthesia Postprocedure Evaluation (Signed)
Anesthesia Post Note  Patient: Morgan Molina  Procedure(s) Performed: LEFT CARPAL TUNNEL RELEASE (Left Hand) LEFT DORSAL GANGLION EXCISION (Left Wrist)     Patient location during evaluation: PACU Anesthesia Type: General Level of consciousness: awake and alert Pain management: pain level controlled Vital Signs Assessment: post-procedure vital signs reviewed and stable Respiratory status: spontaneous breathing, nonlabored ventilation, respiratory function stable and patient connected to nasal cannula oxygen Cardiovascular status: blood pressure returned to baseline and stable Postop Assessment: no apparent nausea or vomiting Anesthetic complications: no    Last Vitals:  Vitals:   02/20/20 1430 02/20/20 1443  BP: (!) 144/84 123/80  Pulse: 69 71  Resp: 18 20  Temp:  37.1 C  SpO2: 98% 100%    Last Pain:  Vitals:   02/20/20 1443  TempSrc:   PainSc: 7                  Fujie Dickison S

## 2020-02-20 NOTE — Transfer of Care (Signed)
Immediate Anesthesia Transfer of Care Note  Patient: Morgan Molina  Procedure(s) Performed: LEFT CARPAL TUNNEL RELEASE (Left Hand) LEFT DORSAL GANGLION EXCISION (Left Wrist)  Patient Location: PACU  Anesthesia Type:General  Level of Consciousness: drowsy  Airway & Oxygen Therapy: Patient Spontanous Breathing and Patient connected to nasal cannula oxygen  Post-op Assessment: Report given to RN and Post -op Vital signs reviewed and stable  Post vital signs: Reviewed and stable  Last Vitals:  Vitals Value Taken Time  BP    Temp    Pulse 70 02/20/20 1345  Resp 17 02/20/20 1345  SpO2 99 % 02/20/20 1345  Vitals shown include unvalidated device data.  Last Pain:  Vitals:   02/20/20 1124  TempSrc: Oral  PainSc: 5       Patients Stated Pain Goal: 4 (02/20/20 1124)  Complications: No apparent anesthesia complications

## 2020-02-20 NOTE — H&P (Signed)
  Morgan Molina is an 44 y.o. female.   Chief Complaint: left carpal tunnel and ganglion HPI: 44 yo female with numbness and tingling left hand.  Positive nerve conduction studies.  Nocturnal symptoms.  She wishes to have left carpal tunnel release.  Also has dorsal ganglion she wishes to have excised.  Allergies:  Allergies  Allergen Reactions  . Latex Dermatitis  . Lisinopril Cough  . Adhesive [Tape] Rash    Past Medical History:  Diagnosis Date  . Anxiety   . Depression   . Hypertension   . PTSD (post-traumatic stress disorder) 09/28/2019    Past Surgical History:  Procedure Laterality Date  . BREAST EXCISIONAL BIOPSY Right   . CHOLECYSTECTOMY    . TUBAL LIGATION      Family History: Family History  Problem Relation Age of Onset  . Cancer Mother   . Hypertension Mother   . Heart attack Mother   . Breast cancer Mother 73  . CAD Father   . Hypertension Father   . Diabetes Father     Social History:   reports that she has been smoking cigarettes. She has been smoking about 0.25 packs per day. She has never used smokeless tobacco. She reports that she does not drink alcohol or use drugs.  Medications: Medications Prior to Admission  Medication Sig Dispense Refill  . citalopram (CELEXA) 40 MG tablet 1 tab by mouth daily 30 tablet 11  . clonazePAM (KLONOPIN) 1 MG tablet 1/2 to 1 tab by mouth twice daily as needed 20 tablet 0  . ibuprofen (ADVIL) 200 MG tablet Take 200 mg by mouth every 6 (six) hours as needed.    . irbesartan-hydrochlorothiazide (AVALIDE) 150-12.5 MG tablet TAKE 1 TABLET BY MOUTH EVERY DAY 90 tablet 3    Results for orders placed or performed during the hospital encounter of 02/20/20 (from the past 48 hour(s))  Pregnancy, urine POC     Status: None   Collection Time: 02/20/20 11:44 AM  Result Value Ref Range   Preg Test, Ur NEGATIVE NEGATIVE    Comment:        THE SENSITIVITY OF THIS METHODOLOGY IS >24 mIU/mL     No results found.   A  comprehensive review of systems was negative.  Blood pressure 127/75, pulse 75, temperature (!) 97.3 F (36.3 C), temperature source Oral, resp. rate 20, height 5\' 4"  (1.626 m), weight 131.2 kg, last menstrual period 02/12/2020, SpO2 100 %.  General appearance: alert, cooperative and appears stated age Head: Normocephalic, without obvious abnormality, atraumatic Neck: supple, symmetrical, trachea midline Cardio: regular rate and rhythm Resp: clear to auscultation bilaterally Extremities: Intact sensation and capillary refill all digits.  +epl/fpl/io.  No wounds.  Pulses: 2+ and symmetric Skin: Skin color, texture, turgor normal. No rashes or lesions Neurologic: Grossly normal Incision/Wound: none  Assessment/Plan Left carpal tunnel syndrome and dorsal ganglion.  Non operative and operative treatment options have been discussed with the patient and patient wishes to proceed with operative treatment. Risks, benefits, and alternatives of surgery have been discussed and the patient agrees with the plan of care.   04/13/2020 02/20/2020, 11:56 AM

## 2020-02-20 NOTE — Discharge Instructions (Addendum)
[facility] Instructions Hand Surgery  Wound Care: Keep your hand elevated above the level of your heart.  Do not allow it to dangle by your side.  Keep the dressing dry and do not remove it unless your doctor advises you to do so.  He will usually change it at the time of your post-op visit.  Moving your fingers is advised to stimulate circulation but will depend on the site of your surgery.  If you have a splint applied, your doctor will advise you regarding movement.  Activity: Do not drive or operate machinery today.  Rest today and then you may return to your normal activity and work as indicated by your physician.  Diet:  Drink liquids today or eat a light diet.  You may resume a regular diet tomorrow.    General expectations: Pain for two to three days. Fingers may become slightly swollen.  Call your doctor if any of the following occur: Severe pain not relieved by pain medication. Elevated temperature. Dressing soaked with blood. Inability to move fingers. White or bluish color to fingers.    Post Anesthesia Home Care Instructions  Activity: Get plenty of rest for the remainder of the day. A responsible individual must stay with you for 24 hours following the procedure.  For the next 24 hours, DO NOT: -Drive a car -Advertising copywriter -Drink alcoholic beverages -Take any medication unless instructed by your physician -Make any legal decisions or sign important papers.  Meals: Start with liquid foods such as gelatin or soup. Progress to regular foods as tolerated. Avoid greasy, spicy, heavy foods. If nausea and/or vomiting occur, drink only clear liquids until the nausea and/or vomiting subsides. Call your physician if vomiting continues.  Special Instructions/Symptoms: Your throat may feel dry or sore from the anesthesia or the breathing tube placed in your throat during surgery. If this causes discomfort, gargle with warm salt water. The discomfort should disappear  within 24 hours.  If you had a scopolamine patch placed behind your ear for the management of post- operative nausea and/or vomiting:  1. The medication in the patch is effective for 72 hours, after which it should be removed.  Wrap patch in a tissue and discard in the trash. Wash hands thoroughly with soap and water. 2. You may remove the patch earlier than 72 hours if you experience unpleasant side effects which may include dry mouth, dizziness or visual disturbances. 3. Avoid touching the patch. Wash your hands with soap and water after contact with the patch.    No ibuprofen until after 8:15 tonight

## 2020-02-20 NOTE — Op Note (Signed)
02/20/2020 [facility]                              OPERATIVE REPORT   PREOPERATIVE DIAGNOSIS:   Left carpal tunnel syndrome.  Left wrist dorsal ganglion  POSTOPERATIVE DIAGNOSIS:   Left carpal tunnel syndrome.  Left wrist dorsal ganglion  PROCEDURE:   1.  Left carpal tunnel release. 2.  Left wrist dorsal ganglion excision  SURGEON:  Betha Loa, MD  ASSISTANT:  none.  ANESTHESIA: General  IV FLUIDS:  Per anesthesia flow sheet.  ESTIMATED BLOOD LOSS:  Minimal.  COMPLICATIONS:  None.  SPECIMENS: Left wrist ganglion to pathology.  TOURNIQUET TIME:    Total Tourniquet Time Documented: Upper Arm (Left) - 35 minutes Total: Upper Arm (Left) - 35 minutes   DISPOSITION:  Stable to PACU.  LOCATION:  SURGERY CENTER  INDICATIONS: 44 year old female with numbness and tingling in the left hand.  Positive nerve conduction studies.  She has nocturnal symptoms.  She wished to have a left carpal tunnel release.  She is also noted a mass at the dorsum of the left wrist that she would like excised as well. She wishes to have a carpal tunnel release for management of her symptoms.  Risks, benefits and alternatives of surgery were discussed including the risk of blood loss; infection; damage to nerves, vessels, tendons, ligaments, bone; failure of surgery; need for additional surgery; complications with wound healing; continued pain; recurrence of carpal tunnel syndrome; recurrence of mass and damage to motor branch. She voiced understanding of these risks and elected to proceed.   OPERATIVE COURSE:  After being identified preoperatively by myself, the patient and I agreed upon the procedure and site of procedure.  The surgical site was marked.  The risks, benefits, and alternatives of the surgery were reviewed and she wished to proceed.  Surgical consent had been signed.  She was given IV Ancef as preoperative antibiotic prophylaxis.  She was transferred to the operating  room and placed on the operating room table in supine position with the Left upper extremity on an armboard.  General anesthesia was induced by the anesthesiologist.  Left upper extremity was prepped and draped in normal sterile orthopaedic fashion.  A surgical pause was performed between the surgeons, anesthesia, and operating room staff, and all were in agreement as to the patient, procedure, and site of procedure.  Tourniquet at the proximal aspect of the extremity was inflated to 250 mmHg after exsanguination of the arm with an Esmarch bandage  Incision was made over the transverse carpal ligament and carried into the subcutaneous tissues by spreading technique.  Bipolar electrocautery was used to obtain hemostasis.  The palmar fascia was sharply incised.  The transverse carpal ligament was identified and sharply incised.  It was incised distally first.  Care was taken to ensure complete decompression distally.  It was then incised proximally.  Scissors were used to split the distal aspect of the volar antebrachial fascia.  A finger was placed into the wound to ensure complete decompression, which was the case.  The nerve was examined.  It was adherent to the radial leaflet.  The motor branch was identified and was intact.  The wound was copiously irrigated with sterile saline.  It was then closed with 4-0 nylon in a horizontal mattress fashion.  It was injected with 0.25% plain Marcaine to aid in postoperative analgesia.  Incision was then made at the dorsum  of the wrist over the mass.  This is carried in subcutaneous tissues by spreading technique.  Bipolar electrocautery is used to obtain hemostasis.  The mass was carefully freed up from soft tissue attachments.  Was coming from the dorsum of the carpal joint.  The stalk was identified and removed.  The mass was sent to pathology for examination.  Was filled with clear gelatinous fluid.  The rent in the capsule was repaired with a 4-0 Vicryl suture in a  figure-of-eight fashion.  The wound was copiously irrigated with sterile saline.  2 inverted interrupted Vicryl sutures were placed in subcutaneous tissues and skin was closed with a running subcuticular 4-0 Monocryl.  This was tied to itself.  The wounds were then dressed with sterile Xeroform, 4x4s, an ABD, and wrapped with Kerlix and an Ace bandage.  Tourniquet was deflated at 35 minutes.  Fingertips were pink with brisk capillary refill after deflation of the tourniquet.  Operative drapes were broken down.  The patient was awoken from anesthesia safely.  She was transferred back to stretcher and taken to the PACU in stable condition.  I will see her back in the office in 1 week for postoperative followup.  I will give her a prescription for Norco 5/325 1-2 tabs PO q6 hours prn pain, dispense # 20.    Leanora Cover, MD Electronically signed, 02/20/20

## 2020-02-23 LAB — SURGICAL PATHOLOGY

## 2020-02-24 ENCOUNTER — Encounter: Payer: Self-pay | Admitting: *Deleted

## 2020-03-22 ENCOUNTER — Ambulatory Visit (INDEPENDENT_AMBULATORY_CARE_PROVIDER_SITE_OTHER): Payer: BC Managed Care – PPO | Admitting: Internal Medicine

## 2020-03-22 ENCOUNTER — Encounter: Payer: Self-pay | Admitting: Internal Medicine

## 2020-03-22 ENCOUNTER — Other Ambulatory Visit: Payer: Self-pay

## 2020-03-22 VITALS — BP 124/78 | HR 80 | Resp 12 | Ht 64.0 in | Wt 291.0 lb

## 2020-03-22 DIAGNOSIS — R739 Hyperglycemia, unspecified: Secondary | ICD-10-CM | POA: Diagnosis not present

## 2020-03-22 DIAGNOSIS — J0140 Acute pansinusitis, unspecified: Secondary | ICD-10-CM | POA: Diagnosis not present

## 2020-03-22 DIAGNOSIS — E669 Obesity, unspecified: Secondary | ICD-10-CM

## 2020-03-22 MED ORDER — FLUCONAZOLE 150 MG PO TABS: ORAL_TABLET | ORAL | 0 refills | Status: DC

## 2020-03-22 MED ORDER — AZITHROMYCIN 500 MG PO TABS: ORAL_TABLET | ORAL | 0 refills | Status: DC

## 2020-03-22 NOTE — Patient Instructions (Signed)
Stop Mucinex nasal spray and go back to Nasocort.  Saline nasal spray daily before using Nasocort (or neti pot)

## 2020-03-22 NOTE — Progress Notes (Signed)
    Subjective:    Patient ID: Morgan Molina, female   DOB: Jun 07, 1976, 44 y.o.   MRN: [id number]   HPI   Developed sense of headache, nasal and sinus congestion.  Some drainage down throat.  No hoarseness or sore throat. Lost sense of taste and smell started first--was tested for COVID prior to surgery on 02/20/20, but  April 23rd, she lost taste and smell.   No fever, aches. Lost hearing in right ear with pain.  Pressing on external ear hurts.  Ear will pop if yawns.   Still with frontal sinus pressure and pain. No shortness of breath or cough.   Did try Mucinex sinus max spray.  Stopped her Nasocort a while ago.  Not using her allergy pills.  Most recently using Allegra.  Has never taken Montelukast.       Current Meds  Medication Sig  . citalopram (CELEXA) 20 MG tablet Take 20 mg by mouth daily.  . clonazePAM (KLONOPIN) 1 MG tablet 1/2 to 1 tab by mouth twice daily as needed  . ibuprofen (ADVIL) 200 MG tablet Take by mouth every 6 (six) hours as needed.   . irbesartan-hydrochlorothiazide (AVALIDE) 150-12.5 MG tablet TAKE 1 TABLET BY MOUTH EVERY DAY  . Oxymetazoline HCl (MUCINEX SINUS-MAX FULL FORCE NA) Place into the nose. 1 spray every 12 hours in each nostril  . [DISCONTINUED] citalopram (CELEXA) 40 MG tablet 1 tab by mouth daily   Allergies  Allergen Reactions  . Latex Dermatitis  . Lisinopril Cough  . Adhesive [Tape] Rash     Review of Systems    Objective:   BP 124/78 (BP Location: Left Arm, Patient Position: Sitting, Cuff Size: Large)   Pulse 80   Resp 12   Ht 5\' 4"  (1.626 m)   Wt 291 lb (132 kg)   LMP 03/05/2020   BMI 49.95 kg/m   Physical Exam  NAD HEENT:  TEnder over frontal and maxillary sinuses bilaterally.  TM with fluid behind and dull bilaterally,  R>>L.  Throat without injection.  Nasal mucosa somewhat swollen with clear discharge. Neck:  Supple, No adenopathy Chest:  CTA CV:  RRR without murmur or rub.  Radial pulses normal and  equal.   Assessment & Plan   1.  Sinusitis:  Azithromycin 500 mg daily for 5 days.  Stop Oxymetazoline--restart Nasocort and OTC antihistamine.  Perhaps Xyzal 5 mg daily.  2.  Hyperglycemia with obesity:  FLP, A1C.  Work on diet and physical activity.  3.  COvID 19:  Refused COvID vaccination today.  Spent 15 minutes attempting to share facts.

## 2020-03-23 LAB — LIPID PANEL W/O CHOL/HDL RATIO
Cholesterol, Total: 171 mg/dL (ref 100–199)
HDL: 70 mg/dL (ref >39)
LDL Chol Calc (NIH): 87 mg/dL (ref 0–99)
Triglycerides: 77 mg/dL (ref 0–149)
VLDL Cholesterol Cal: 14 mg/dL (ref 5–40)

## 2020-03-23 LAB — HGB A1C W/O EAG: Hgb A1c MFr Bld: 5.8 % — ABNORMAL HIGH (ref 4.8–5.6)

## 2020-04-27 ENCOUNTER — Other Ambulatory Visit: Payer: Self-pay

## 2020-04-27 ENCOUNTER — Encounter: Payer: Self-pay | Admitting: Internal Medicine

## 2020-04-27 ENCOUNTER — Ambulatory Visit (INDEPENDENT_AMBULATORY_CARE_PROVIDER_SITE_OTHER): Payer: BC Managed Care – PPO | Admitting: Internal Medicine

## 2020-04-27 VITALS — BP 128/70 | HR 72 | Resp 12 | Ht 64.0 in | Wt 283.0 lb

## 2020-04-27 DIAGNOSIS — H6983 Other specified disorders of Eustachian tube, bilateral: Secondary | ICD-10-CM | POA: Diagnosis not present

## 2020-04-27 DIAGNOSIS — H6523 Chronic serous otitis media, bilateral: Secondary | ICD-10-CM | POA: Diagnosis not present

## 2020-04-27 NOTE — Progress Notes (Signed)
    Subjective:    Patient ID: Morgan Molina, female   DOB: 1976/02/12, 44 y.o.   MRN: [id number]   HPI   1.  Right ear loss of hearing.  States her hearing is worse on the right now as compared to what it was back in May when seen for pansinusitis.  Other symptoms improved with sinus infection and antibiotics.   Using Nasocort and Xyzal regularly.  Feels her allergy symptoms are controlled.   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   Is not having any popping or intermittent improvement of her hearing from time to time.    Current Meds  Medication Sig  . citalopram (CELEXA) 20 MG tablet Take 20 mg by mouth daily.  . clonazePAM (KLONOPIN) 1 MG tablet 1/2 to 1 tab by mouth twice daily as needed  . ibuprofen (ADVIL) 200 MG tablet Take by mouth every 6 (six) hours as needed.   . irbesartan-hydrochlorothiazide (AVALIDE) 150-12.5 MG tablet TAKE 1 TABLET BY MOUTH EVERY DAY  . levocetirizine (XYZAL) 5 MG tablet Take 5 mg by mouth every evening.  . Triamcinolone Acetonide (NASACORT ALLERGY 24HR NA) Place into the nose.   Allergies  Allergen Reactions  . Latex Dermatitis  . Lisinopril Cough  . Adhesive [Tape] Rash     Review of Systems    Objective:   BP 128/70 (BP Location: Left Arm, Patient Position: Sitting, Cuff Size: Large)   Pulse 72   Resp 12   Ht 5\' 4"  (1.626 m)   Wt 283 lb (128.4 kg)   LMP 04/25/2020   BMI 48.58 kg/m   Physical Exam  NAD HEENT:  PERRL, EOMI, TMs:  Left retracted a bit and dull with what appears to be serous fluid behind.  Right TM bulging slightly and appears dull also with serous fluid behind.  No erythema.   Nasal mucosa without significant swelling, no erythema.  Throat without obvious cobbling or erythema Neck:  Supple, No adenopathy Chest:  CTA CV:  RRR without murmur or rub.  Radial pulses normal and equal   Assessment & Plan   1.  Bilateral Eustachian Tube dysfunction with  serous otitis, chronic.  Right sided hearing loss.  Referral to Dr. 04/27/2020, ENT for more definitive care as she is utilizing her allergy medication regularly now.   Treated for sinusitis in May and her loss of hearing never improved.

## 2020-06-07 ENCOUNTER — Other Ambulatory Visit (INDEPENDENT_AMBULATORY_CARE_PROVIDER_SITE_OTHER): Payer: BC Managed Care – PPO

## 2020-06-07 DIAGNOSIS — Z9189 Other specified personal risk factors, not elsewhere classified: Secondary | ICD-10-CM

## 2020-06-07 LAB — POC COVID19 BINAXNOW: SARS Coronavirus 2 Ag: NEGATIVE

## 2020-08-15 DIAGNOSIS — H6523 Chronic serous otitis media, bilateral: Secondary | ICD-10-CM | POA: Insufficient documentation

## 2020-08-15 DIAGNOSIS — H6993 Unspecified Eustachian tube disorder, bilateral: Secondary | ICD-10-CM | POA: Insufficient documentation

## 2020-08-15 DIAGNOSIS — H6983 Other specified disorders of Eustachian tube, bilateral: Secondary | ICD-10-CM | POA: Insufficient documentation

## 2020-10-13 ENCOUNTER — Telehealth: Payer: Self-pay | Admitting: Internal Medicine

## 2020-10-13 NOTE — Telephone Encounter (Signed)
Patient called asking for a refill on her meds recommended by her therapist. patient did not want to disclosed information on what meds. Patient requested to talk directly with Dr. Delrae Alfred or to schedule an appointment to see her.  Please advise.

## 2020-10-14 NOTE — Telephone Encounter (Signed)
Called patient to get more information regarding which medications. LVM for pt. To return call  Need to know which medications patient is referring to so that we can better assist patient with her care if still not wishing to disclose information will set up appointment

## 2020-11-02 ENCOUNTER — Encounter: Payer: Self-pay | Admitting: Internal Medicine

## 2020-11-02 ENCOUNTER — Telehealth: Payer: Self-pay | Admitting: Internal Medicine

## 2020-11-02 NOTE — Telephone Encounter (Signed)
Patient called for a second time to request the refill of her prescriptions. Patient reiterated that she is not willing to disclose information with any person if is not the Doctor. Patient stated that she received [facility] message, but since she's not the doctor or nurse she will not disclose information to her. Patient is currently active on My Chart.

## 2020-11-04 MED ORDER — CLONAZEPAM 1 MG PO TABS: ORAL_TABLET | ORAL | 0 refills | Status: DC

## 2020-11-04 NOTE — Telephone Encounter (Signed)
Patient is wanting Pfizer vaccine on Jan 3--please work her in--not clear if she wants in morning or afternoon. She is also needing Clonazepam to calm before getting the vaccination.  Discussed I would prescribe 5 one mg tabs for her to use the days she gets vaccinated

## 2020-11-04 NOTE — Addendum Note (Signed)
Addended by: Marcene Duos on: 11/04/2020 05:35 PM   Modules accepted: Orders

## 2020-11-08 ENCOUNTER — Telehealth: Payer: Self-pay | Admitting: Internal Medicine

## 2020-11-08 MED ORDER — CEPHALEXIN 500 MG PO CAPS: ORAL_CAPSULE | ORAL | 0 refills | Status: DC

## 2020-11-08 NOTE — Telephone Encounter (Signed)
[facility] [address]

## 2020-11-08 NOTE — Telephone Encounter (Signed)
Patient here to get started on Pfizer COVID vaccination. She mentioned she has 2 quarter sized boils that have not yet come to a head  Has performed hot baths with epsom salts. Sending in Rx for Cephalexin.   If no improvement with continued warm soaks and abx, to call for I & D

## 2020-11-25 ENCOUNTER — Ambulatory Visit (HOSPITAL_COMMUNITY)
Admission: EM | Admit: 2020-11-25 | Discharge: 2020-11-25 | Disposition: A | Discharge status: Home / Self Care | Payer: BC Managed Care – PPO

## 2020-11-25 ENCOUNTER — Emergency Department (HOSPITAL_COMMUNITY)
Admission: EM | Admit: 2020-11-25 | Discharge: 2020-11-26 | Disposition: A | Discharge status: Home / Self Care | Payer: BC Managed Care – PPO | Attending: Emergency Medicine | Admitting: Emergency Medicine

## 2020-11-25 ENCOUNTER — Encounter (HOSPITAL_COMMUNITY): Payer: Self-pay | Admitting: Emergency Medicine

## 2020-11-25 ENCOUNTER — Other Ambulatory Visit: Payer: Self-pay

## 2020-11-25 DIAGNOSIS — R109 Unspecified abdominal pain: Secondary | ICD-10-CM | POA: Diagnosis not present

## 2020-11-25 DIAGNOSIS — M79605 Pain in left leg: Secondary | ICD-10-CM | POA: Insufficient documentation

## 2020-11-25 DIAGNOSIS — Z5321 Procedure and treatment not carried out due to patient leaving prior to being seen by health care provider: Secondary | ICD-10-CM | POA: Insufficient documentation

## 2020-11-25 LAB — COMPREHENSIVE METABOLIC PANEL
ALT: 14 U/L (ref 0–44)
AST: 17 U/L (ref 15–41)
Albumin: 3.6 g/dL (ref 3.5–5.0)
Alkaline Phosphatase: 36 U/L — ABNORMAL LOW (ref 38–126)
Anion gap: 11 (ref 5–15)
BUN: 7 mg/dL (ref 6–20)
CO2: 24 mmol/L (ref 22–32)
Calcium: 9.1 mg/dL (ref 8.9–10.3)
Chloride: 105 mmol/L (ref 98–111)
Creatinine, Ser: 0.79 mg/dL (ref 0.44–1.00)
GFR, Estimated: >60 mL/min (ref >60)
Glucose, Bld: 113 mg/dL — ABNORMAL HIGH (ref 70–99)
Potassium: 3.6 mmol/L (ref 3.5–5.1)
Sodium: 140 mmol/L (ref 135–145)
Total Bilirubin: 0.6 mg/dL (ref 0.3–1.2)
Total Protein: 6.7 g/dL (ref 6.5–8.1)

## 2020-11-25 LAB — URINALYSIS, ROUTINE W REFLEX MICROSCOPIC
Bilirubin Urine: NEGATIVE
Glucose, UA: NEGATIVE mg/dL
Hgb urine dipstick: NEGATIVE
Ketones, ur: NEGATIVE mg/dL
Nitrite: NEGATIVE
Protein, ur: NEGATIVE mg/dL
Specific Gravity, Urine: 1.016 (ref 1.005–1.030)
pH: 5 (ref 5.0–8.0)

## 2020-11-25 LAB — CBC
HCT: 34.1 % — ABNORMAL LOW (ref 36.0–46.0)
Hemoglobin: 10.6 g/dL — ABNORMAL LOW (ref 12.0–15.0)
MCH: 25.6 pg — ABNORMAL LOW (ref 26.0–34.0)
MCHC: 31.1 g/dL (ref 30.0–36.0)
MCV: 82.4 fL (ref 80.0–100.0)
Platelets: 367 10*3/uL (ref 150–400)
RBC: 4.14 MIL/uL (ref 3.87–5.11)
RDW: 17.5 % — ABNORMAL HIGH (ref 11.5–15.5)
WBC: 6.9 10*3/uL (ref 4.0–10.5)
nRBC: 0 % (ref 0.0–0.2)

## 2020-11-25 LAB — LIPASE, BLOOD: Lipase: 29 U/L (ref 11–51)

## 2020-11-25 NOTE — Telephone Encounter (Signed)
Discussed with Dr. Delrae Alfred and patient needs to go UC to make sure she does not have a DVT.  Patient informed and is headed that way

## 2020-11-25 NOTE — ED Triage Notes (Signed)
Patient complaining of left leg x1 week. Patient states she prescribed an antibiotic by PCP on Thursday for boils. Has tried a knee brace but pain not improved. Patient also complains of abdominal pain that started today.

## 2020-11-25 NOTE — Telephone Encounter (Signed)
Patient called stating she finished the Cephalexin 11/16/2020 and the boils are still there and have not opened up. Now her hands are completely peeled off and her feet are still peeling. Pt. Also states she noticed a lump behind her leg around the knee area that feels really heavy and after standing for 10 minutes she needs to sit down . She is using a knee brace to help it. Heaviness on entire left .

## 2020-11-26 ENCOUNTER — Telehealth: Payer: Self-pay | Admitting: Internal Medicine

## 2020-11-26 NOTE — Telephone Encounter (Signed)
Patient called to let you know that she went to the ER yesterday, but because she was told the wait was 15-18 hours, she left without seen a Doctor. However, patient stated that they took blood samples before she left and is concern about the results, specially the RDW. Patient is asking if you can please review the results and give advise based on them.

## 2020-11-26 NOTE — ED Notes (Signed)
Pt called 3x no answer  

## 2020-12-07 NOTE — Telephone Encounter (Signed)
She continues to have mild anemia--that's the cause of her abnormal labs for most part.   Her blood sugar continues to be mildly high as would expect with her prediabetes. Have her symptoms resolved for which she called in and was sent to the ED?

## 2020-12-15 NOTE — Telephone Encounter (Signed)
Spoke with patient and she stated that she went to the ER a second time and they gave her potassium  because it was low. Patient stated that fells better but continue to have some issues. Patient stated taht she does not wish to see a doctor at this moment.

## 2020-12-31 ENCOUNTER — Telehealth: Payer: Self-pay | Admitting: Internal Medicine

## 2020-12-31 NOTE — Telephone Encounter (Signed)
Patient called requesting a refill for irbesartan-hydrochlorothiazide (AVALIDE) 150-12.5 MG tablet and citalopram (CELEXA) 20 MG tablet.   Patient stated that at some point you increased the dosage of citalopram and is why she is out of that Rx earlier than expected.

## 2021-01-04 MED ORDER — IRBESARTAN-HYDROCHLOROTHIAZIDE 150-12.5 MG PO TABS: 1.0000 | ORAL_TABLET | Freq: Every day | ORAL | 0 refills | Status: DC

## 2021-01-04 MED ORDER — CITALOPRAM HYDROBROMIDE 20 MG PO TABS: 20.0000 mg | ORAL_TABLET | Freq: Every day | ORAL | 0 refills | Status: DC

## 2021-01-04 NOTE — Addendum Note (Signed)
Addended by: Marcene Duos on: 01/04/2021 04:09 PM   Modules accepted: Orders

## 2021-01-04 NOTE — Telephone Encounter (Signed)
Called patient and left a message letting her know that her Rx refill has been sent.

## 2021-01-20 ENCOUNTER — Telehealth: Payer: Self-pay | Admitting: Internal Medicine

## 2021-01-20 NOTE — Telephone Encounter (Signed)
Patient called requesting an appointment. Patient stated that she fainted at a Doctor's parking lot and continues to have Dizziness, and extreme anxiety attacks. Patient requested an appointment to reevaluate her meds. And see if everything is really working right for her. Please advise.

## 2021-01-27 NOTE — Telephone Encounter (Signed)
Please work in as an acute

## 2021-02-04 ENCOUNTER — Telehealth (INDEPENDENT_AMBULATORY_CARE_PROVIDER_SITE_OTHER): Payer: BC Managed Care – PPO | Admitting: Internal Medicine

## 2021-02-04 DIAGNOSIS — F419 Anxiety disorder, unspecified: Secondary | ICD-10-CM

## 2021-02-04 DIAGNOSIS — F410 Panic disorder [episodic paroxysmal anxiety] without agoraphobia: Secondary | ICD-10-CM | POA: Diagnosis not present

## 2021-02-04 DIAGNOSIS — F32A Depression, unspecified: Secondary | ICD-10-CM | POA: Diagnosis not present

## 2021-02-04 MED ORDER — CLONAZEPAM 1 MG PO TABS: ORAL_TABLET | ORAL | 0 refills | Status: DC

## 2021-02-04 MED ORDER — SERTRALINE HCL 50 MG PO TABS: 50.0000 mg | ORAL_TABLET | Freq: Every day | ORAL | 3 refills | Status: DC

## 2021-02-04 NOTE — Telephone Encounter (Signed)
Patient was scheduled for 02/04/2021.

## 2021-02-04 NOTE — Progress Notes (Signed)
    Subjective:    Patient ID: Morgan Molina, female   DOB: 10-04-1976, 45 y.o.   MRN: [id number]   HPI   1.  Panic Attacks and dizziness:  Difficult history as patient defensive.   Wants Clonazepam refilled as having more panic attacks and falling episodes she has with panic attacks. States reached out to Olar, her counselor, who reportedly feels she would do better with Clonazepam as needed. End of year with more stressors/life issues and started with more panic attacks, anxiety.   Did not like me asking if any suicidal thoughts.   Started taking 40 mg of Citaloparm on her own in February. Dizzy and queasy "fallen out more times"  Twice.  Current Meds  Medication Sig  . ibuprofen (ADVIL) 200 MG tablet Take by mouth every 6 (six) hours as needed.   . irbesartan-hydrochlorothiazide (AVALIDE) 150-12.5 MG tablet Take 1 tablet by mouth daily.   Allergies  Allergen Reactions  . Latex Dermatitis  . Lisinopril Cough  . Adhesive [Tape] Rash     Review of Systems    Objective:   There were no vitals taken for this visit.  Physical Exam NAD, but defensive, frustrated appearing.  Assessment & Plan   Panic disorder/depression/anxiety:  Discussed switching to Sertraline 50 mg daily.   At length, again discussed utilizing Clonazepam only in the short term as make the switch to Sertraline, and not for long term intermittent use or just use of Clonazepam as needed as she prefers. Will have her follow up in person in 1 week to be certain she is stable with switch to Sertraline.   She did not want to switch to a more comparable dose of 100 mg of Sertraline (to her current 40 mg dose of Citalopram)--wants to start out with the lower 50 mg dose

## 2021-02-11 ENCOUNTER — Encounter: Payer: Self-pay | Admitting: Internal Medicine

## 2021-02-11 ENCOUNTER — Ambulatory Visit (INDEPENDENT_AMBULATORY_CARE_PROVIDER_SITE_OTHER): Payer: BC Managed Care – PPO | Admitting: Internal Medicine

## 2021-02-11 ENCOUNTER — Other Ambulatory Visit: Payer: Self-pay

## 2021-02-11 VITALS — BP 148/78 | HR 92 | Resp 12 | Ht 64.0 in | Wt 290.0 lb

## 2021-02-11 DIAGNOSIS — L52 Erythema nodosum: Secondary | ICD-10-CM | POA: Diagnosis not present

## 2021-02-11 DIAGNOSIS — F32A Depression, unspecified: Secondary | ICD-10-CM

## 2021-02-11 DIAGNOSIS — N6452 Nipple discharge: Secondary | ICD-10-CM

## 2021-02-11 DIAGNOSIS — F419 Anxiety disorder, unspecified: Secondary | ICD-10-CM

## 2021-02-11 DIAGNOSIS — F410 Panic disorder [episodic paroxysmal anxiety] without agoraphobia: Secondary | ICD-10-CM | POA: Diagnosis not present

## 2021-02-11 NOTE — Progress Notes (Signed)
Subjective:    Patient ID: Morgan Molina, female   DOB: [birth year], 45 y.o.   MRN: [id number]   HPI   1.  Panic Disorder:  Started Sertraline 50 mg on 02/05/2021.  No significant difference positive or negative since switch.   Denies Suicidal ideation. She describes living away from her family.  Does not want to get into it today, but apparently drama at home she could not deal with and is living separately for now.    2.  Pain all over, arms, knees, but develops bruising when taking ibuprofen--no history of injuring the areas.  Stopped taking ibuprofen 600 mg once daily about 2 weeks ago.  Feels she has lumps all over he soft tissue areas.  States she noted bruising starting October of last year when she was taking ibuprofen twice daily at the time.   Tylenol upsets her stomach, so cannot take that instead.   No bleeding from nose and no bleeding from gums from regular brushing--can occur with deep flossing.  The bruising looks dark and then becomes sort of red.   No definite erythema or swelling of joints--particularly knees, they just hurt.  No use of antibiotics recently or back before October when symptoms started. No sore throat or skin infection she can recall prior to symptoms beginning.   No cough, wheeze, SOB.  History of right breast mass, which on biopsy ultimately with duct debris, but no papilloma or other concern.   Not clear she ultimately followed up after 02/10/2019 mammogram with MR of bilateral  breasts (nipple discharge) as planned.  Patient developed increased panic disorder with this issue mid 2019 during evaluation and did not go back until followed up in 02/2019 here.           Current Meds  Medication Sig  . clonazePAM (KLONOPIN) 1 MG tablet 1/2 to 1 tab by mouth as needed for anxiety  . irbesartan-hydrochlorothiazide (AVALIDE) 150-12.5 MG tablet Take 1 tablet by mouth daily.  . sertraline (ZOLOFT) 50 MG tablet Take 1 tablet (50 mg total) by mouth daily.    Allergies  Allergen Reactions  . Latex Dermatitis  . Lisinopril Cough  . Adhesive [Tape] Rash     Review of Systems    Objective:   BP (!) 148/78 (BP Location: Right Arm, Patient Position: Sitting, Cuff Size: Large)   Pulse 92   Resp 12   Ht 5\' 4"  (1.626 m)   Wt 290 lb (131.5 kg)   BMI 49.78 kg/m   Physical Exam  NAD HEENT:  PERRL, EOMI, TMs pearly gray.  Throat without injection Neck:  Supple, No adenopathy, no thyromegaly Chest:  CTA CV: RRR without murmur or rub.  Radial and DP pulses normal and equal Abd:  S, NT, No HSM or mass, + BS MS:  No erythema, synovial thickening or effusions of any joint, most particularly her knees.  One lesion on left pretibial area is a smoothly elevated darkened area.  Nontender currently.  States started out red.  Similar lesion on right upper arm.     Assessment & Plan   1.  Panic disorder/anxiety/depression:  Continue with Sertraline 50 mg daily and follow up in 6-8 weeks to determine if need to increase dosing.  2.  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  She is unable to stay today for labs.  Will have her return beginning of next week for labs. CBC, CMP, Sed Rate,  CRP, ASO titre.  Consider Sarcoid as well, though no symptoms to suggest today--will discuss at lab visit.

## 2021-02-14 ENCOUNTER — Other Ambulatory Visit: Payer: Self-pay

## 2021-02-14 ENCOUNTER — Other Ambulatory Visit: Payer: BC Managed Care – PPO | Admitting: Internal Medicine

## 2021-02-14 DIAGNOSIS — L52 Erythema nodosum: Secondary | ICD-10-CM

## 2021-02-14 NOTE — Progress Notes (Signed)
Unable to obtain quantiferon--blood stopped flowing with second tube each time

## 2021-02-15 LAB — CBC WITH DIFFERENTIAL/PLATELET
Basophils Absolute: 0.1 10*3/uL (ref 0.0–0.2)
Basos: 1 %
EOS (ABSOLUTE): 0.1 10*3/uL (ref 0.0–0.4)
Eos: 2 %
Hematocrit: 33.4 % — ABNORMAL LOW (ref 34.0–46.6)
Hemoglobin: 10.4 g/dL — ABNORMAL LOW (ref 11.1–15.9)
Immature Grans (Abs): 0 10*3/uL (ref 0.0–0.1)
Immature Granulocytes: 0 %
Lymphocytes Absolute: 3 10*3/uL (ref 0.7–3.1)
Lymphs: 44 %
MCH: 25.4 pg — ABNORMAL LOW (ref 26.6–33.0)
MCHC: 31.1 g/dL — ABNORMAL LOW (ref 31.5–35.7)
MCV: 82 fL (ref 79–97)
Monocytes Absolute: 0.6 10*3/uL (ref 0.1–0.9)
Monocytes: 10 %
Neutrophils Absolute: 2.9 10*3/uL (ref 1.4–7.0)
Neutrophils: 43 %
Platelets: 359 10*3/uL (ref 150–450)
RBC: 4.1 x10E6/uL (ref 3.77–5.28)
RDW: 15.8 % — ABNORMAL HIGH (ref 11.7–15.4)
WBC: 6.8 10*3/uL (ref 3.4–10.8)

## 2021-02-15 LAB — ANTISTREPTOLYSIN O TITER: ASO: 43 IU/mL (ref 0.0–200.0)

## 2021-02-15 LAB — COMPREHENSIVE METABOLIC PANEL
ALT: 12 IU/L (ref 0–32)
AST: 16 IU/L (ref 0–40)
Albumin/Globulin Ratio: 1.6 (ref 1.2–2.2)
Albumin: 4.1 g/dL (ref 3.8–4.8)
Alkaline Phosphatase: 51 IU/L (ref 44–121)
BUN/Creatinine Ratio: 10 (ref 9–23)
BUN: 7 mg/dL (ref 6–24)
Bilirubin Total: 0.2 mg/dL (ref 0.0–1.2)
CO2: 20 mmol/L (ref 20–29)
Calcium: 9.4 mg/dL (ref 8.7–10.2)
Chloride: 100 mmol/L (ref 96–106)
Creatinine, Ser: 0.72 mg/dL (ref 0.57–1.00)
Globulin, Total: 2.6 g/dL (ref 1.5–4.5)
Glucose: 128 mg/dL — ABNORMAL HIGH (ref 65–99)
Potassium: 4 mmol/L (ref 3.5–5.2)
Sodium: 142 mmol/L (ref 134–144)
Total Protein: 6.7 g/dL (ref 6.0–8.5)
eGFR: 105 mL/min/{1.73_m2} (ref >59)

## 2021-02-15 LAB — ANGIOTENSIN CONVERTING ENZYME: Angio Convert Enzyme: 22 U/L (ref 14–82)

## 2021-02-15 LAB — SEDIMENTATION RATE: Sed Rate: 71 mm/hr — ABNORMAL HIGH (ref 0–32)

## 2021-02-15 LAB — HIGH SENSITIVITY CRP: CRP, High Sensitivity: 3.31 mg/L — ABNORMAL HIGH (ref 0.00–3.00)

## 2021-02-21 ENCOUNTER — Other Ambulatory Visit: Payer: Self-pay | Admitting: Orthopedic Surgery

## 2021-02-23 ENCOUNTER — Other Ambulatory Visit: Payer: Self-pay

## 2021-02-23 ENCOUNTER — Encounter (HOSPITAL_BASED_OUTPATIENT_CLINIC_OR_DEPARTMENT_OTHER): Payer: Self-pay | Admitting: Orthopedic Surgery

## 2021-02-24 ENCOUNTER — Other Ambulatory Visit (HOSPITAL_COMMUNITY)
Admission: RE | Admit: 2021-02-24 | Discharge: 2021-02-24 | Disposition: A | Discharge status: Home / Self Care | Payer: BC Managed Care – PPO | Source: Ambulatory Visit | Attending: Orthopedic Surgery | Admitting: Orthopedic Surgery

## 2021-02-24 DIAGNOSIS — Z01812 Encounter for preprocedural laboratory examination: Secondary | ICD-10-CM | POA: Insufficient documentation

## 2021-02-24 DIAGNOSIS — Z20822 Contact with and (suspected) exposure to covid-19: Secondary | ICD-10-CM | POA: Insufficient documentation

## 2021-02-24 LAB — SARS CORONAVIRUS 2 (TAT 6-24 HRS): SARS Coronavirus 2: NEGATIVE

## 2021-02-24 NOTE — Progress Notes (Signed)
Anesthesia  consult per Dr. Bass, will proceed with surgery as scheduled.  ?

## 2021-02-24 NOTE — Progress Notes (Signed)
Anesthesiology Note:  Asked to see 45 year old female scheduled for right CTR. Patient had L. Carpal Tunnel surgery on 02/20/20 under LMA. Patient has BMI 51.4.  Ok for [facility].  Kipp Brood

## 2021-02-28 ENCOUNTER — Encounter (HOSPITAL_BASED_OUTPATIENT_CLINIC_OR_DEPARTMENT_OTHER)
Admission: RE | Disposition: A | Discharge status: Home / Self Care | Payer: Self-pay | Source: Ambulatory Visit | Attending: Orthopedic Surgery

## 2021-02-28 ENCOUNTER — Ambulatory Visit (HOSPITAL_BASED_OUTPATIENT_CLINIC_OR_DEPARTMENT_OTHER)
Admission: RE | Admit: 2021-02-28 | Discharge: 2021-02-28 | Disposition: A | Discharge status: Home / Self Care | Payer: BC Managed Care – PPO | Source: Ambulatory Visit | Attending: Orthopedic Surgery | Admitting: Orthopedic Surgery

## 2021-02-28 ENCOUNTER — Ambulatory Visit (HOSPITAL_BASED_OUTPATIENT_CLINIC_OR_DEPARTMENT_OTHER): Payer: BC Managed Care – PPO | Admitting: Anesthesiology

## 2021-02-28 ENCOUNTER — Other Ambulatory Visit: Payer: Self-pay

## 2021-02-28 ENCOUNTER — Encounter (HOSPITAL_BASED_OUTPATIENT_CLINIC_OR_DEPARTMENT_OTHER): Payer: Self-pay | Admitting: Orthopedic Surgery

## 2021-02-28 DIAGNOSIS — Z87891 Personal history of nicotine dependence: Secondary | ICD-10-CM | POA: Diagnosis not present

## 2021-02-28 DIAGNOSIS — Z79899 Other long term (current) drug therapy: Secondary | ICD-10-CM | POA: Diagnosis not present

## 2021-02-28 DIAGNOSIS — Z888 Allergy status to other drugs, medicaments and biological substances status: Secondary | ICD-10-CM | POA: Diagnosis not present

## 2021-02-28 DIAGNOSIS — G5601 Carpal tunnel syndrome, right upper limb: Secondary | ICD-10-CM | POA: Diagnosis present

## 2021-02-28 DIAGNOSIS — M795 Residual foreign body in soft tissue: Secondary | ICD-10-CM | POA: Insufficient documentation

## 2021-02-28 DIAGNOSIS — Z91040 Latex allergy status: Secondary | ICD-10-CM | POA: Diagnosis not present

## 2021-02-28 DIAGNOSIS — Z6841 Body Mass Index (BMI) 40.0 and over, adult: Secondary | ICD-10-CM | POA: Insufficient documentation

## 2021-02-28 HISTORY — PX: CARPAL TUNNEL RELEASE: SHX101

## 2021-02-28 LAB — POCT PREGNANCY, URINE: Preg Test, Ur: NEGATIVE

## 2021-02-28 SURGERY — CARPAL TUNNEL RELEASE
Anesthesia: General | Site: Wrist | Laterality: Right

## 2021-02-28 MED ORDER — CEFAZOLIN SODIUM-DEXTROSE 2-4 GM/100ML-% IV SOLN
INTRAVENOUS | Status: AC
  Filled 2021-02-28: qty 100

## 2021-02-28 MED ORDER — FENTANYL CITRATE (PF) 100 MCG/2ML IJ SOLN
INTRAMUSCULAR | Status: AC
  Filled 2021-02-28: qty 2

## 2021-02-28 MED ORDER — HYDROCODONE-ACETAMINOPHEN 5-325 MG PO TABS: ORAL_TABLET | ORAL | 0 refills | Status: DC

## 2021-02-28 MED ORDER — BUPIVACAINE HCL (PF) 0.25 % IJ SOLN
INTRAMUSCULAR | Status: DC | PRN
  Administered 2021-02-28: 8 mL

## 2021-02-28 MED ORDER — LIDOCAINE HCL (CARDIAC) PF 100 MG/5ML IV SOSY
PREFILLED_SYRINGE | INTRAVENOUS | Status: DC | PRN
  Administered 2021-02-28: 60 mg via INTRAVENOUS

## 2021-02-28 MED ORDER — ONDANSETRON HCL 4 MG/2ML IJ SOLN
INTRAMUSCULAR | Status: DC | PRN
  Administered 2021-02-28: 4 mg via INTRAVENOUS

## 2021-02-28 MED ORDER — ACETAMINOPHEN 500 MG PO TABS
ORAL_TABLET | ORAL | Status: AC
  Filled 2021-02-28: qty 2

## 2021-02-28 MED ORDER — PROPOFOL 10 MG/ML IV BOLUS
INTRAVENOUS | Status: AC
  Filled 2021-02-28: qty 40

## 2021-02-28 MED ORDER — ACETAMINOPHEN 500 MG PO TABS: 1000.0000 mg | ORAL_TABLET | Freq: Once | ORAL | Status: DC

## 2021-02-28 MED ORDER — CELECOXIB 200 MG PO CAPS
ORAL_CAPSULE | ORAL | Status: AC
  Filled 2021-02-28: qty 1

## 2021-02-28 MED ORDER — DEXTROSE 5 % IV SOLN
3.0000 g | Freq: Once | INTRAVENOUS | Status: AC
  Administered 2021-02-28: 3 g via INTRAVENOUS
  Filled 2021-02-28: qty 3000

## 2021-02-28 MED ORDER — MIDAZOLAM HCL 5 MG/5ML IJ SOLN
INTRAMUSCULAR | Status: DC | PRN
  Administered 2021-02-28: 2 mg via INTRAVENOUS

## 2021-02-28 MED ORDER — DEXAMETHASONE SODIUM PHOSPHATE 10 MG/ML IJ SOLN
INTRAMUSCULAR | Status: DC | PRN
  Administered 2021-02-28: 10 mg via INTRAVENOUS

## 2021-02-28 MED ORDER — LACTATED RINGERS IV SOLN: INTRAVENOUS | Status: DC

## 2021-02-28 MED ORDER — DEXAMETHASONE SODIUM PHOSPHATE 10 MG/ML IJ SOLN
INTRAMUSCULAR | Status: AC
  Filled 2021-02-28: qty 1

## 2021-02-28 MED ORDER — MIDAZOLAM HCL 2 MG/2ML IJ SOLN
INTRAMUSCULAR | Status: AC
  Filled 2021-02-28: qty 2

## 2021-02-28 MED ORDER — ONDANSETRON HCL 4 MG/2ML IJ SOLN
INTRAMUSCULAR | Status: AC
  Filled 2021-02-28: qty 2

## 2021-02-28 MED ORDER — CEFAZOLIN SODIUM-DEXTROSE 2-4 GM/100ML-% IV SOLN: 2.0000 g | INTRAVENOUS | Status: DC

## 2021-02-28 MED ORDER — LIDOCAINE 2% (20 MG/ML) 5 ML SYRINGE
INTRAMUSCULAR | Status: AC
  Filled 2021-02-28: qty 5

## 2021-02-28 MED ORDER — FENTANYL CITRATE (PF) 100 MCG/2ML IJ SOLN
INTRAMUSCULAR | Status: DC | PRN
  Administered 2021-02-28 (×4): 25 ug via INTRAVENOUS

## 2021-02-28 MED ORDER — CELECOXIB 200 MG PO CAPS: 200.0000 mg | ORAL_CAPSULE | Freq: Once | ORAL | Status: DC

## 2021-02-28 MED ORDER — PROPOFOL 10 MG/ML IV BOLUS
INTRAVENOUS | Status: DC | PRN
  Administered 2021-02-28: 200 mg via INTRAVENOUS

## 2021-02-28 MED ORDER — AMISULPRIDE (ANTIEMETIC) 5 MG/2ML IV SOLN: 10.0000 mg | Freq: Once | INTRAVENOUS | Status: DC | PRN

## 2021-02-28 MED ORDER — FENTANYL CITRATE (PF) 100 MCG/2ML IJ SOLN
25.0000 ug | INTRAMUSCULAR | Status: DC | PRN
  Administered 2021-02-28: 25 ug via INTRAVENOUS

## 2021-02-28 MED ORDER — SODIUM CHLORIDE 0.9 % IV SOLN
INTRAVENOUS | Status: AC
  Filled 2021-02-28: qty 10

## 2021-02-28 MED ORDER — CEFAZOLIN IN SODIUM CHLORIDE 3-0.9 GM/100ML-% IV SOLN
3.0000 g | Freq: Once | INTRAVENOUS | Status: DC
  Filled 2021-02-28: qty 100

## 2021-02-28 SURGICAL SUPPLY — 35 items
APL PRP STRL LF DISP 70% ISPRP (MISCELLANEOUS) ×1
BLADE SURG 15 STRL LF DISP TIS (BLADE) ×2 IMPLANT
BLADE SURG 15 STRL SS (BLADE) ×4
BNDG CMPR 9X4 STRL LF SNTH (GAUZE/BANDAGES/DRESSINGS)
BNDG ELASTIC 3X5.8 VLCR STR LF (GAUZE/BANDAGES/DRESSINGS) ×2 IMPLANT
BNDG ESMARK 4X9 LF (GAUZE/BANDAGES/DRESSINGS) IMPLANT
BNDG GAUZE ELAST 4 BULKY (GAUZE/BANDAGES/DRESSINGS) ×2 IMPLANT
CHLORAPREP W/TINT 26 (MISCELLANEOUS) ×2 IMPLANT
CORD BIPOLAR FORCEPS 12FT (ELECTRODE) ×2 IMPLANT
COVER BACK TABLE 60X90IN (DRAPES) ×2 IMPLANT
COVER MAYO STAND STRL (DRAPES) ×2 IMPLANT
COVER WAND RF STERILE (DRAPES) IMPLANT
CUFF TOURN SGL QUICK 18X4 (TOURNIQUET CUFF) ×2 IMPLANT
DRAPE EXTREMITY T 121X128X90 (DISPOSABLE) ×2 IMPLANT
DRAPE SURG 17X23 STRL (DRAPES) ×2 IMPLANT
DRSG PAD ABDOMINAL 8X10 ST (GAUZE/BANDAGES/DRESSINGS) ×2 IMPLANT
GAUZE SPONGE 4X4 12PLY STRL (GAUZE/BANDAGES/DRESSINGS) ×2 IMPLANT
GAUZE XEROFORM 1X8 LF (GAUZE/BANDAGES/DRESSINGS) ×2 IMPLANT
GLOVE SRG 8 PF TXTR STRL LF DI (GLOVE) ×1 IMPLANT
GLOVE SURG ENC MOIS LTX SZ7.5 (GLOVE) IMPLANT
GLOVE SURG UNDER POLY LF SZ8 (GLOVE) ×2
GOWN STRL REUS W/ TWL LRG LVL3 (GOWN DISPOSABLE) ×2 IMPLANT
GOWN STRL REUS W/TWL LRG LVL3 (GOWN DISPOSABLE) ×4
GOWN STRL REUS W/TWL XL LVL3 (GOWN DISPOSABLE) ×2 IMPLANT
NEEDLE HYPO 25X1 1.5 SAFETY (NEEDLE) ×2 IMPLANT
NS IRRIG 1000ML POUR BTL (IV SOLUTION) ×2 IMPLANT
PACK BASIN DAY SURGERY FS (CUSTOM PROCEDURE TRAY) ×2 IMPLANT
PADDING CAST ABS 4INX4YD NS (CAST SUPPLIES) ×1
PADDING CAST ABS COTTON 4X4 ST (CAST SUPPLIES) ×1 IMPLANT
STOCKINETTE 4X48 STRL (DRAPES) ×2 IMPLANT
SUT ETHILON 4 0 PS 2 18 (SUTURE) ×2 IMPLANT
SYR BULB EAR ULCER 3OZ GRN STR (SYRINGE) ×2 IMPLANT
SYR CONTROL 10ML LL (SYRINGE) ×2 IMPLANT
TOWEL GREEN STERILE FF (TOWEL DISPOSABLE) ×4 IMPLANT
UNDERPAD 30X36 HEAVY ABSORB (UNDERPADS AND DIAPERS) ×2 IMPLANT

## 2021-02-28 NOTE — Discharge Instructions (Addendum)

## 2021-02-28 NOTE — Anesthesia Procedure Notes (Signed)
Procedure Name: LMA Insertion Date/Time: 02/28/2021 1:37 PM Performed by: Lauralyn Primes, CRNA Pre-anesthesia Checklist: Patient identified, Emergency Drugs available, Suction available and Patient being monitored Patient Re-evaluated:Patient Re-evaluated prior to induction Oxygen Delivery Method: Circle system utilized Preoxygenation: Pre-oxygenation with 100% oxygen Induction Type: IV induction Ventilation: Mask ventilation without difficulty LMA: LMA inserted LMA Size: 4.0 Number of attempts: 1 Airway Equipment and Method: Bite block Placement Confirmation: positive ETCO2 Tube secured with: Tape Dental Injury: Teeth and Oropharynx as per pre-operative assessment

## 2021-02-28 NOTE — H&P (Signed)
Morgan Molina is an 45 y.o. female.   Chief Complaint: carpal tunnel syndrome HPI: 45 yo female with numbness and tingling right hand.  Positive nerve conduction studies.  She wishes to have right carpal tunnel release.  Allergies:  Allergies  Allergen Reactions  . Latex Dermatitis  . Lisinopril Cough  . Adhesive [Tape] Rash    Past Medical History:  Diagnosis Date  . Anxiety   . Depression   . Hypertension   . PTSD (post-traumatic stress disorder) 09/28/2019    Past Surgical History:  Procedure Laterality Date  . BREAST EXCISIONAL BIOPSY Right   . CARPAL TUNNEL RELEASE Left 02/20/2020   Procedure: LEFT CARPAL TUNNEL RELEASE;  Surgeon: Betha Loa, MD;  Location: [facility];  Service: Orthopedics;  Laterality: Left;  . CHOLECYSTECTOMY    . GANGLION CYST EXCISION Left 02/20/2020   Procedure: LEFT DORSAL GANGLION EXCISION;  Surgeon: Betha Loa, MD;  Location: [facility];  Service: Orthopedics;  Laterality: Left;  . TUBAL LIGATION      Family History: Family History  Problem Relation Age of Onset  . Cancer Mother   . Hypertension Mother   . Heart attack Mother   . Breast cancer Mother 40  . CAD Father   . Hypertension Father   . Diabetes Father     Social History:   reports that she quit smoking about 16 months ago. Her smoking use included cigarettes. She smoked 0.10 packs per day. She has never used smokeless tobacco. She reports current alcohol use. She reports that she does not use drugs.  Medications: Medications Prior to Admission  Medication Sig Dispense Refill  . clonazePAM (KLONOPIN) 1 MG tablet 1/2 to 1 tab by mouth as needed for anxiety 20 tablet 0  . irbesartan-hydrochlorothiazide (AVALIDE) 150-12.5 MG tablet Take 1 tablet by mouth daily. 90 tablet 0  . sertraline (ZOLOFT) 50 MG tablet Take 1 tablet (50 mg total) by mouth daily. 30 tablet 3  . levocetirizine (XYZAL) 5 MG tablet Take 5 mg by mouth every evening. (Patient  not taking: No sig reported)      Results for orders placed or performed during the hospital encounter of 02/28/21 (from the past 48 hour(s))  Pregnancy, urine POC     Status: None   Collection Time: 02/28/21 12:07 PM  Result Value Ref Range   Preg Test, Ur NEGATIVE NEGATIVE    Comment:        THE SENSITIVITY OF THIS METHODOLOGY IS >24 mIU/mL     No results found.   A comprehensive review of systems was negative.  Blood pressure 123/69, pulse 77, temperature 98.6 F (37 C), temperature source Oral, resp. rate 18, height 5\' 3"  (1.6 m), weight 131.3 kg, last menstrual period 12/26/2020, SpO2 100 %.  General appearance: alert, cooperative and appears stated age Head: Normocephalic, without obvious abnormality, atraumatic Neck: supple, symmetrical, trachea midline Cardio: regular rate and rhythm Resp: clear to auscultation bilaterally Extremities: Intact sensation and capillary refill all digits.  +epl/fpl/io.  No wounds.  Pulses: 2+ and symmetric Skin: Skin color, texture, turgor normal. No rashes or lesions Neurologic: Grossly normal Incision/Wound: none  Assessment/Plan Right carpal tunnel syndrome.  Non operative and operative treatment options have been discussed with the patient and patient wishes to proceed with operative treatment. Risks, benefits, and alternatives of surgery have been discussed and the patient agrees with the plan of care.   12/28/2020 02/28/2021, 12:58 PM

## 2021-02-28 NOTE — Anesthesia Preprocedure Evaluation (Signed)
Anesthesia Evaluation  Patient identified by MRN, date of birth, ID band Patient awake    Reviewed: Allergy & Precautions, NPO status , Patient's Chart, lab work & pertinent test results  History of Anesthesia Complications Negative for: history of anesthetic complications  Airway Mallampati: II  TM Distance: >3 FB Neck ROM: Full    Dental  (+) Missing,    Pulmonary neg pulmonary ROS, Current Smoker and Patient abstained from smoking., former smoker,    Pulmonary exam normal        Cardiovascular hypertension, Pt. on medications Normal cardiovascular exam     Neuro/Psych Anxiety Depression negative neurological ROS  negative psych ROS   GI/Hepatic negative GI ROS, Neg liver ROS,   Endo/Other  Morbid obesity  Renal/GU negative Renal ROS  negative genitourinary   Musculoskeletal negative musculoskeletal ROS (+) LEFT CARPAL TUNNEL SYNDROME. LEFT WRIST DORSAL GANGLION   Abdominal   Peds negative pediatric ROS (+)  Hematology negative hematology ROS (+)   Anesthesia Other Findings Day of surgery medications reviewed with patient.  Reproductive/Obstetrics negative OB ROS                             Anesthesia Physical  Anesthesia Plan  ASA: III  Anesthesia Plan: General   Post-op Pain Management:    Induction: Intravenous  PONV Risk Score and Plan: 2 and Ondansetron, Dexamethasone and Treatment may vary due to age or medical condition  Airway Management Planned: LMA  Additional Equipment:   Intra-op Plan:   Post-operative Plan: Extubation in OR  Informed Consent: I have reviewed the patients History and Physical, chart, labs and discussed the procedure including the risks, benefits and alternatives for the proposed anesthesia with the patient or authorized representative who has indicated his/her understanding and acceptance.     Dental advisory given  Plan Discussed with:  CRNA and Surgeon  Anesthesia Plan Comments: (Patient seen as preop anesthesia consult for elevated BMI. Reassuring airway as documented. Regional IV anesthesia discussed briefly but patient informed that anesthetic plan would ultimately be determined by anesthesiologist on day of surgery. All questions and concerns addressed to patient's satisfaction. Dereck Ligas, MD)        Anesthesia Quick Evaluation

## 2021-02-28 NOTE — Transfer of Care (Signed)
Immediate Anesthesia Transfer of Care Note  Patient: Morgan Molina  Procedure(s) Performed: CARPAL TUNNEL RELEASE (Right Wrist)  Patient Location: PACU  Anesthesia Type:General  Level of Consciousness: drowsy  Airway & Oxygen Therapy: Patient Spontanous Breathing and Patient connected to face mask oxygen  Post-op Assessment: Report given to RN and Post -op Vital signs reviewed and stable  Post vital signs: Reviewed and stable  Last Vitals:  Vitals Value Taken Time  BP 155/86 02/28/21 1413  Temp    Pulse 71 02/28/21 1415  Resp 17 02/28/21 1415  SpO2 100 % 02/28/21 1415  Vitals shown include unvalidated device data.  Last Pain:  Vitals:   02/28/21 1217  TempSrc: Oral  PainSc: 3       Patients Stated Pain Goal: 3 (02/28/21 1217)  Complications: No complications documented.

## 2021-02-28 NOTE — Op Note (Signed)
02/28/2021 [facility]                              OPERATIVE REPORT   PREOPERATIVE DIAGNOSIS:  Right carpal tunnel syndrome.  POSTOPERATIVE DIAGNOSIS:  Right carpal tunnel syndrome.  PROCEDURE:  Right carpal tunnel release.  SURGEON:  Betha Loa, MD  ASSISTANT:  none.  ANESTHESIA: General  IV FLUIDS:  Per anesthesia flow sheet.  ESTIMATED BLOOD LOSS:  Minimal.  COMPLICATIONS:  None.  SPECIMENS:  Dark material/lesion to pathology.  TOURNIQUET TIME:    Total Tourniquet Time Documented: Forearm (Right) - 18 minutes Total: Forearm (Right) - 18 minutes   DISPOSITION:  Stable to PACU.  LOCATION: [facility]  INDICATIONS:  45 yo female with numbness and tingling right hand.  Positive nerve conduction studies.   She wishes to have a carpal tunnel release for management of her symptoms.  Risks, benefits and alternatives of surgery were discussed including the risk of blood loss; infection; damage to nerves, vessels, tendons, ligaments, bone; failure of surgery; need for additional surgery; complications with wound healing; continued pain; recurrence of carpal tunnel syndrome; and damage to motor branch. She voiced understanding of these risks and elected to proceed.   OPERATIVE COURSE:  After being identified preoperatively by myself, the patient and I agreed upon the procedure and site of procedure.  The surgical site was marked.  Surgical consent had been signed.  She was given IV Ancef as preoperative antibiotic prophylaxis.  She was transferred to the operating room and placed on the operating room table in supine position with the Right upper extremity on an armboard.  General anesthesia was induced by the anesthesiologist.  Right upper extremity was prepped and draped in normal sterile orthopaedic fashion.  A surgical pause was performed between the surgeons, anesthesia, and operating room staff, and all were in agreement as to the patient, procedure,  and site of procedure.  Tourniquet at the proximal aspect of the forearm was inflated to 250 mmHg after exsanguination of the arm with an Esmarch bandage  Incision was made over the transverse carpal ligament and carried into the subcutaneous tissues by spreading technique.  Bipolar electrocautery was used to obtain hemostasis.  The palmar fascia was sharply incised.  The transverse carpal ligament was identified and sharply incised.  It was incised distally first.  The flexor tendons were identified.  There was a dark material/lesion in the tenosynovium.  this was removed and sent to pathology.  The flexor tendon to the ring finger was identified and retracted radially.  The transverse carpal ligament was then incised proximally.  Scissors were used to split the distal aspect of the volar antebrachial fascia.  A finger was placed into the wound to ensure complete decompression, which was the case.  The nerve was examined.  It was flattened and hyperemic.  The motor branch was identified and was intact.  The wound was copiously irrigated with sterile saline.  It was then closed with 4-0 nylon in a horizontal mattress fashion.  It was injected with 0.25% plain Marcaine to aid in postoperative analgesia.  It was dressed with sterile Xeroform, 4x4s, an ABD, and wrapped with Kerlix and an Ace bandage.  Tourniquet was deflated at 18 minutes.  Fingertips were pink with brisk capillary refill after deflation of the tourniquet.  Operative drapes were broken down.  The patient was awoken from anesthesia safely.  She was transferred back  to stretcher and taken to the PACU in stable condition.  I will see her back in the office in 1 week for postoperative followup.  I will give her a prescription for Norco 5/325 1-2 tabs PO q6 hours prn pain, dispense # 20.    Betha Loa, MD Electronically signed, 02/28/21

## 2021-03-01 ENCOUNTER — Encounter (HOSPITAL_BASED_OUTPATIENT_CLINIC_OR_DEPARTMENT_OTHER): Payer: Self-pay | Admitting: Orthopedic Surgery

## 2021-03-01 LAB — SURGICAL PATHOLOGY

## 2021-03-02 NOTE — Anesthesia Postprocedure Evaluation (Signed)
Anesthesia Post Note  Patient: Morgan Molina  Procedure(s) Performed: CARPAL TUNNEL RELEASE (Right Wrist)     Patient location during evaluation: PACU Anesthesia Type: General Level of consciousness: awake and alert Pain management: pain level controlled Vital Signs Assessment: post-procedure vital signs reviewed and stable Respiratory status: spontaneous breathing, nonlabored ventilation, respiratory function stable and patient connected to nasal cannula oxygen Cardiovascular status: blood pressure returned to baseline and stable Postop Assessment: no apparent nausea or vomiting Anesthetic complications: no   No complications documented.  Last Vitals:  Vitals:   02/28/21 1430 02/28/21 1445  BP: 131/78 115/77  Pulse: 64 74  Resp: 14 16  Temp:  (!) 36.3 C  SpO2: 99% 100%    Last Pain:  Vitals:   02/28/21 1445  TempSrc:   PainSc: 4                  Kennieth Rad

## 2021-03-25 ENCOUNTER — Ambulatory Visit (INDEPENDENT_AMBULATORY_CARE_PROVIDER_SITE_OTHER): Payer: BC Managed Care – PPO | Admitting: Internal Medicine

## 2021-03-25 ENCOUNTER — Encounter: Payer: Self-pay | Admitting: Internal Medicine

## 2021-03-25 ENCOUNTER — Other Ambulatory Visit: Payer: Self-pay

## 2021-03-25 VITALS — BP 128/78 | HR 76 | Resp 12 | Ht 63.0 in | Wt 291.0 lb

## 2021-03-25 DIAGNOSIS — R0602 Shortness of breath: Secondary | ICD-10-CM | POA: Diagnosis not present

## 2021-03-25 DIAGNOSIS — R739 Hyperglycemia, unspecified: Secondary | ICD-10-CM

## 2021-03-25 DIAGNOSIS — R700 Elevated erythrocyte sedimentation rate: Secondary | ICD-10-CM

## 2021-03-25 DIAGNOSIS — M2550 Pain in unspecified joint: Secondary | ICD-10-CM

## 2021-03-25 DIAGNOSIS — L52 Erythema nodosum: Secondary | ICD-10-CM

## 2021-03-25 MED ORDER — ALBUTEROL SULFATE HFA 108 (90 BASE) MCG/ACT IN AERS: 2.0000 | INHALATION_SPRAY | Freq: Four times a day (QID) | RESPIRATORY_TRACT | 2 refills | Status: DC | PRN

## 2021-03-25 NOTE — Progress Notes (Signed)
Subjective:    Patient ID: Morgan Molina, female   DOB: 1976-07-30, 45 y.o.   MRN: [id number]   HPI   1.  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  States currently no new ones, but on chest, arms, scalp.  Has had lesions on face, but not clear these are the same thing--states she gets a darkness and feel hard and then get scabbed.  Has been using cortisone cream as they itch and seems to help them clear faster.  Also, states has lesions in scalp and losing hair where they occur. At last lab visit, patient remembered being diagnosed with sarcoid over 20 years ago.  Initially, sounded like she was not treated.  Have since received scant records from that diagnosis.   02/1994:  found to have hilar and paratracheal lymphadenopathy.  At the time, with shortness of breath, recurrent bronchitis.  Felt like something sitting on her chest.   Possibly treated for 6 months with albuterol and prednisone  Currently with joint pains and swelling.  Knees, elbows, neck.  Also MCP, particularly on left index, but also having triggering of index and thumb on left.  Has not noted redness with joint swelling. Also, perhaps some dyspnea at times. Discussed her ACE level was in normal range, though does not definitively rule out sarcoid.  Her CRP and Sed Rate are both elevated and she has normocytic anemia.  ASO level was normal as well. Am also concerned with the work up to utilize prednisone as her blood glucose is a bit high as well.    2.  Panic Disorder/anxiety/depression:  Doing better on Sertraline 50 mg .  She is not interested in increasing her dose.  Sounds like she is back with her family.  Current Meds  Medication Sig  . irbesartan-hydrochlorothiazide (AVALIDE) 150-12.5 MG tablet Take 1 tablet by mouth daily.  . sertraline (ZOLOFT) 50 MG tablet Take 1 tablet (50 mg total) by mouth daily.   Allergies  Allergen Reactions  . Latex Dermatitis  . Lisinopril Cough  . Adhesive [Tape] Rash      Review of Systems    Objective:   BP 128/78 (BP Location: Left Arm, Patient Position: Sitting, Cuff Size: Large)   Pulse 76   Resp 12   Ht 5\' 3"  (1.6 m)   Wt 291 lb (132 kg)   LMP 03/02/2021   BMI 51.55 kg/m   Physical Exam  NAD Wearing a wig and unwilling to remove to inspect scalp and reported patchy hair loss HEENT:  PERRL, EOMI, throat without injection Neck:  Supple, No adenopathy Chest:  CTA CV:  RRR without murmur or rub.  Radial and DP pulses normal and equal Abd:  S, NT, + BS Skin:  No obvious lesions today similar to what was noted at last visit.  Flaking of skin on face.  No malar rash. MS:  No obvious effusion or erythema of joints.  No synovial thickening.   Assessment & Plan  1.  Skin lesions:  What appeared to be erythema nodosum last month.  Do not see similar lesions today, but patient states they have been appearing still.   Now have records she was diagnosed with Sarcoid in 1995.  Initially, she stated at lab visit that she was not treated, but today, sounds like she did take prednisone for a prolonged period with wean and that was successful. ACE level normal Send for CXR and urine calcium/creatinine With joint complaints and elevated inflammatory  markers, check ANA, RF Also, with elevated glucose and possible need to treat recurrent sarcoid with prednisone, check A1C. Finally, need to clarify whether she followed up as recommended for breast findings/nipple discharge.   Have had difficulties with her falling away from medical evaluation and treatment due to life stressors with pandemic.  2.  Joint complaints:  Labs as above.    3.  Hyperglycemia:  A1C

## 2021-03-26 LAB — ANA W/REFLEX IF POSITIVE: Anti Nuclear Antibody (ANA): NEGATIVE

## 2021-03-26 LAB — RHEUMATOID FACTOR: Rheumatoid fact SerPl-aCnc: <10 IU/mL (ref <14.0)

## 2021-03-26 LAB — HGB A1C W/O EAG: Hgb A1c MFr Bld: 6.1 % — ABNORMAL HIGH (ref 4.8–5.6)

## 2021-03-26 LAB — CALCIUM / CREATININE RATIO, URINE
Calcium, Urine: 11.4 mg/dL
Calcium/Creat.Ratio: 61 mg/g creat (ref 29–442)
Creatinine, Urine: 185.8 mg/dL

## 2021-03-28 ENCOUNTER — Ambulatory Visit
Admission: RE | Admit: 2021-03-28 | Discharge: 2021-03-28 | Disposition: A | Discharge status: Home / Self Care | Payer: BC Managed Care – PPO | Source: Ambulatory Visit | Attending: Internal Medicine | Admitting: Internal Medicine

## 2021-03-28 DIAGNOSIS — R0602 Shortness of breath: Secondary | ICD-10-CM

## 2021-03-30 DIAGNOSIS — R739 Hyperglycemia, unspecified: Secondary | ICD-10-CM | POA: Insufficient documentation

## 2021-03-30 DIAGNOSIS — L52 Erythema nodosum: Secondary | ICD-10-CM | POA: Insufficient documentation

## 2021-03-30 DIAGNOSIS — R0602 Shortness of breath: Secondary | ICD-10-CM | POA: Insufficient documentation

## 2021-03-30 DIAGNOSIS — M2550 Pain in unspecified joint: Secondary | ICD-10-CM | POA: Insufficient documentation

## 2021-03-30 MED ORDER — IRBESARTAN-HYDROCHLOROTHIAZIDE 150-12.5 MG PO TABS: 1.0000 | ORAL_TABLET | Freq: Every day | ORAL | 3 refills | Status: DC

## 2021-03-30 NOTE — Addendum Note (Signed)
Addended by: Marcene Duos on: 03/30/2021 01:49 PM   Modules accepted: Orders

## 2021-05-03 ENCOUNTER — Other Ambulatory Visit: Payer: Self-pay | Admitting: Orthopedic Surgery

## 2021-05-05 NOTE — Progress Notes (Signed)
Steward Drone from the the [employer] has left message asking if pt would be appropriate for her procedure here at [facility] regarding the pts most recent BMI of 51.55. Pt has had 2 previous hand procedures done here this past year and after consulting with Dr. Hyacinth Meeker this pt should be appropriate given the type of procedure that is scheduled. Pt will need to come in before dos for a current weight check. If her weight and BMI have not increased then pt should not require another anesthesia consult and can continue with her current planned procedure here at [facility].

## 2021-05-19 NOTE — Progress Notes (Signed)
Office Visit Note  Patient: Morgan Molina             Date of Birth: 11/05/1976           MRN: [id number]             PCP: Mack Hook, MD Referring: Mack Hook, MD Visit Date: 06/02/2021 Occupation: _0 @  Subjective:  Pain in multiple joints.   History of Present Illness: Morgan Molina is a 45 y.o. female patient of her request of her PCP.  According the patient she was diagnosed with sarcoidosis in 1995 when she had shortness of breath and a skin rash.  She was under care of a pulmonologist who did chest x-ray and PFTs that she recalls.  She was treated with prednisone off and on for about 6 months.  She states she gained a lot of weight while she was on prednisone.  No other medications were given at the time.  She has had recurrent shortness of breath and difficulty breathing since then.  She does not recall having a lymph node biopsy.  She has had off-and-on lymph node swelling since then.  She also has history of frequent otitis media.  She has not seen ENT.  She states for the last 3 years she has been having pain and discomfort in multiple joints.  She gives history of pain and swelling in her bilateral knee joints which last for about 2 to 3 weeks and then resolves.  She also has discomfort in her shoulders, bilateral hands bilateral ankles and her neck.  She had bilateral carpal tunnel release about 1-1/2 years ago she states the paresthesias resolved but she still has pain and swelling.  She had neck x-rays at that time which were unremarkable per patient.  She also had lumbar spine x-rays and was referred to a back specialist but she did not go for a follow-up visit.  She has noticed some knots on her body which move around.  She states she recently had 1 on her back.  There is positive family history of lupus in her brother, maternal aunt and paternal aunt.  She is gravida 5, para 5, miscarriages 0.  No history of DVTs.  Activities of Daily Living:  Patient  reports morning stiffness for all day. Patient Reports nocturnal pain.  Difficulty dressing/grooming: Reports Difficulty climbing stairs: Reports Difficulty getting out of chair: Reports Difficulty using hands for taps, buttons, cutlery, and/or writing: Reports  Review of Systems  Constitutional:  Positive for fatigue.  HENT:  Positive for mouth dryness and nose dryness. Negative for mouth sores.   Eyes:  Positive for pain, itching and dryness.  Respiratory:  Positive for shortness of breath. Negative for difficulty breathing.   Cardiovascular:  Negative for chest pain and palpitations.  Gastrointestinal:  Negative for blood in stool, constipation and diarrhea.  Endocrine: Positive for increased urination.  Genitourinary:  Positive for difficulty urinating.  Musculoskeletal:  Positive for joint pain, joint pain, joint swelling, myalgias, morning stiffness, muscle tenderness and myalgias.  Skin:  Negative for color change, rash and redness.  Allergic/Immunologic: Negative for susceptible to infections.  Neurological:  Positive for dizziness, numbness, headaches and weakness. Negative for memory loss.  Hematological:  Positive for bruising/bleeding tendency.  Psychiatric/Behavioral:  Negative for confusion.    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   Arthralgia 03/30/2021   Hyperglycemia 03/30/2021   Shortness of breath 03/30/2021  Bilateral chronic serous otitis media 08/15/2020   Eustachian tube dysfunction, bilateral 08/15/2020   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   Ganglion cyst of dorsum of left wrist 10/01/2019   Decreased visual acuity 10/01/2019   PTSD (post-traumatic stress disorder) 09/28/2019   Nipple discharge 04/02/2019   Panic attacks 02/19/2019   Bilateral low back pain 02/19/2019   Seasonal allergies 02/19/2019   Anxiety and depression 01/14/2019   Syncope 01/14/2019   Breast mass, right  01/14/2019   Hypertension     Past Medical History:  Diagnosis Date   Anxiety    Depression    Hypertension    PTSD (post-traumatic stress disorder) 09/28/2019    Family History  Problem Relation Age of Onset   Cancer Mother    Hypertension Mother    Heart attack Mother    Breast cancer Mother 88   CAD Father    Hypertension Father    Diabetes Father    Past Surgical History:  Procedure Laterality Date   BREAST EXCISIONAL BIOPSY Right    CARPAL TUNNEL RELEASE Left 02/20/2020   Procedure: LEFT CARPAL TUNNEL RELEASE;  Surgeon: Leanora Cover, MD;  Location: [facility];  Service: Orthopedics;  Laterality: Left;   CARPAL TUNNEL RELEASE Right 02/28/2021   Procedure: CARPAL TUNNEL RELEASE;  Surgeon: Leanora Cover, MD;  Location: [facility];  Service: Orthopedics;  Laterality: Right;   CHOLECYSTECTOMY     GANGLION CYST EXCISION Left 02/20/2020   Procedure: LEFT DORSAL GANGLION EXCISION;  Surgeon: Leanora Cover, MD;  Location: [facility];  Service: Orthopedics;  Laterality: Left;   TUBAL LIGATION     Social History   Social History Narrative   Not on file   Immunization History  Administered Date(s) Administered   DTaP 04/25/1976, 07/18/1976, 12/12/1976, 01/15/1980   MMR 01/15/1980   OPV 04/25/1976, 07/18/1976, 01/15/1980   Td 11/30/1997     Objective: Vital Signs: BP 138/82 (BP Location: Right Wrist, Patient Position: Sitting, Cuff Size: Normal)   Pulse 87   Ht 5' 4.25" (1.632 m)   Wt 293 lb (132.9 kg)   BMI 49.90 kg/m    Physical Exam Vitals and nursing note reviewed.  Constitutional:      Appearance: She is well-developed.  HENT:     Head: Normocephalic and atraumatic.  Eyes:     Conjunctiva/sclera: Conjunctivae normal.  Cardiovascular:     Rate and Rhythm: Normal rate and regular rhythm.     Heart sounds: Normal heart sounds.  Pulmonary:     Effort: Pulmonary effort is normal.     Breath sounds: Normal breath sounds.   Abdominal:     General: Bowel sounds are normal.     Palpations: Abdomen is soft.  Musculoskeletal:     Cervical back: Normal range of motion.  Lymphadenopathy:     Cervical: No cervical adenopathy.  Skin:    General: Skin is warm and dry.     Capillary Refill: Capillary refill takes less than 2 seconds.  Neurological:     Mental Status: She is alert and oriented to person, place, and time.  Psychiatric:        Behavior: Behavior normal.     Musculoskeletal Exam: She had painful range of motion of cervical and lumbar spine.  Shoulder joints were in good range of motion with discomfort.  Elbow joints and wrist joints with good range of motion.  She has a scar from bilateral carpal tunnel release.  She had tenderness  across PIPs but no synovitis was noted.  She discomfort with range of motion of her hip joints and knee joints.  No warmth swelling or effusion was noted.  She had no tenderness over ankle joints.  There was no tenderness across MTPs.  CDAI Exam: CDAI Score: -- Patient Global: --; Provider Global: -- Swollen: --; Tender: -- Joint Exam 06/02/2021   No joint exam has been documented for this visit   There is currently no information documented on the homunculus. Go to the Rheumatology activity and complete the homunculus joint exam.  Investigation: No additional findings.  Imaging: No results found.  Recent Labs: Lab Results  Component Value Date   WBC 6.8 02/14/2021   HGB 10.4 (L) 02/14/2021   PLT 359 02/14/2021   NA 142 02/14/2021   K 4.0 02/14/2021   CL 100 02/14/2021   CO2 20 02/14/2021   GLUCOSE 128 (H) 02/14/2021   BUN 7 02/14/2021   CREATININE 0.72 02/14/2021   BILITOT 0.2 02/14/2021   ALKPHOS 51 02/14/2021   AST 16 02/14/2021   ALT 12 02/14/2021   PROT 6.7 02/14/2021   ALBUMIN 4.1 02/14/2021   CALCIUM 9.4 02/14/2021   GFRAA >60 02/17/2020    Speciality Comments: No specialty comments available.  Procedures:  No procedures  performed Allergies: Latex, Lisinopril, and Adhesive [tape]   Assessment / Plan:     Visit Diagnoses: Polyarthralgia-she gives history of pain and discomfort in multiple joints for many years.  She also gives history of intermittent swelling in her joints.  No synovitis was noted on the examination today.  Although she had discomfort with range of motion.  Pain in both hands -she complains of discomfort in her bilateral hands with intermittent swelling.  She describes tenderness over PIP joints.  No synovitis was noted.  Plan: XR Hand 2 View Right, XR Hand 2 View Left.  X-ray of bilateral hands were consistent with osteoarthritis.  Status post bilateral carpal tunnel release-she had bilateral carpal tunnel release about 1-1/2 years ago per patient.  She states the paresthesias improved.  Chronic pain of both knees -she complains of ongoing pain and discomfort in her bilateral knee joints with intermittent swelling.  No synovitis was noted on the examination today.  I will obtain following x-rays and the labs today.  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  Sedimentation rate, Cyclic citrul peptide antibody, IgG, 14-3-3 eta Protein, Uric acid  Neck pain -she has ongoing pain and discomfort in her cervical region.  Plan: XR Cervical Spine 2 or 3 views.  Degenerative changes and facet joint arthropathy was noted.  Chronic midline low back pain without sciatica -she complains of severe pain and discomfort in her lower back and difficulty walking.  She requests referral to a back specialist.  Plan: XR Lumbar Spine 2-3 Views.  Facet joint arthropathy was noted.  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  I do not see any EN lesions on my examination today.  Elevated sed rate - 02/14/21: ESR 71, CRP  3.31, Ace 22, ASO 43.0. 03/25/21: ANA negative, RF negative.  Her sed rate was elevated.  I will repeat her sed rate today.  Shortness of breath-patient states in 1995 she was seen by pulmonologist who diagnosed her with sarcoidosis.  She recalls having a chest x-ray  but did not have a lymph node biopsy.  She was on prednisone for about 6 months which caused weight gain.  She has had shortness of breath since then.  Per her request we will refer her to pulmonologist for evaluation.  I also reviewed most recent chest x-ray which was unremarkable.  Other fatigue -she complains of increased fatigue.  Plan: CK, Serum protein electrophoresis with reflex, IgG, IgA, IgM  Primary hypertension-blood pressure was normal today.  Bilateral chronic serous otitis media-she has history of recurrent Ear infections.  Per her request I will refer her to ENT.  Other medical problems are listed as follows:  PTSD (post-traumatic stress disorder)  Panic attacks  Anxiety and depression  Family history of systemic lupus erythematosus - Brother, and maternal aunt  Orders: Orders Placed This Encounter  Procedures   XR KNEE 3 VIEW RIGHT   XR KNEE 3 VIEW LEFT   XR Lumbar Spine 2-3 Views   XR Cervical Spine 2 or 3 views   XR Hand 2 View Right   XR Hand 2 View Left   CK   Sedimentation rate   Cyclic citrul peptide antibody, IgG   14-3-3 eta Protein   Uric acid   Serum protein electrophoresis with reflex   IgG, IgA, IgM   Ambulatory referral to Dermatology   Ambulatory referral to Pulmonology   AMB referral to orthopedics   Ambulatory referral to ENT    No orders of the defined types were placed in this encounter.    Follow-Up Instructions: Return for Pain in multiple joints.   Bo Merino, MD  Note - This record has been created using Editor, commissioning.  Chart creation errors have been sought, but may not always  have been located. Such creation errors do not reflect on  the standard of  medical care.

## 2021-05-31 ENCOUNTER — Telehealth: Payer: Self-pay | Admitting: Internal Medicine

## 2021-05-31 DIAGNOSIS — R590 Localized enlarged lymph nodes: Secondary | ICD-10-CM

## 2021-05-31 NOTE — Telephone Encounter (Signed)
Called patient to offer appointment today due to no show and patient shared that she is at work and unable to take last minute appointments.   Patient states she would like to get a referral for a diagnostic mammogram a few years ago due to a lymph node that gets swollen. Pt. States she had this happened before and was sent for a diagnostic . Pt. States lymph node is not swollen at this time ,Denies pain, fever or other symptoms.  Pt. Informed message will be routed to nurse for further assessment

## 2021-06-01 NOTE — Telephone Encounter (Signed)
Pt would like a mammogram

## 2021-06-02 ENCOUNTER — Ambulatory Visit: Payer: Self-pay

## 2021-06-02 ENCOUNTER — Ambulatory Visit (INDEPENDENT_AMBULATORY_CARE_PROVIDER_SITE_OTHER): Payer: BC Managed Care – PPO | Admitting: Rheumatology

## 2021-06-02 ENCOUNTER — Encounter: Payer: Self-pay | Admitting: Rheumatology

## 2021-06-02 ENCOUNTER — Telehealth: Payer: Self-pay

## 2021-06-02 ENCOUNTER — Other Ambulatory Visit: Payer: Self-pay

## 2021-06-02 VITALS — BP 138/82 | HR 87 | Ht 64.25 in | Wt 293.0 lb

## 2021-06-02 DIAGNOSIS — M25561 Pain in right knee: Secondary | ICD-10-CM | POA: Diagnosis not present

## 2021-06-02 DIAGNOSIS — M2550 Pain in unspecified joint: Secondary | ICD-10-CM

## 2021-06-02 DIAGNOSIS — F419 Anxiety disorder, unspecified: Secondary | ICD-10-CM

## 2021-06-02 DIAGNOSIS — R700 Elevated erythrocyte sedimentation rate: Secondary | ICD-10-CM

## 2021-06-02 DIAGNOSIS — G8929 Other chronic pain: Secondary | ICD-10-CM

## 2021-06-02 DIAGNOSIS — R0602 Shortness of breath: Secondary | ICD-10-CM

## 2021-06-02 DIAGNOSIS — M79642 Pain in left hand: Secondary | ICD-10-CM | POA: Diagnosis not present

## 2021-06-02 DIAGNOSIS — F4310 Post-traumatic stress disorder, unspecified: Secondary | ICD-10-CM

## 2021-06-02 DIAGNOSIS — M545 Low back pain, unspecified: Secondary | ICD-10-CM

## 2021-06-02 DIAGNOSIS — F32A Depression, unspecified: Secondary | ICD-10-CM

## 2021-06-02 DIAGNOSIS — M542 Cervicalgia: Secondary | ICD-10-CM

## 2021-06-02 DIAGNOSIS — M79641 Pain in right hand: Secondary | ICD-10-CM | POA: Diagnosis not present

## 2021-06-02 DIAGNOSIS — H6523 Chronic serous otitis media, bilateral: Secondary | ICD-10-CM

## 2021-06-02 DIAGNOSIS — M67432 Ganglion, left wrist: Secondary | ICD-10-CM

## 2021-06-02 DIAGNOSIS — M25562 Pain in left knee: Secondary | ICD-10-CM | POA: Diagnosis not present

## 2021-06-02 DIAGNOSIS — R5383 Other fatigue: Secondary | ICD-10-CM

## 2021-06-02 DIAGNOSIS — F410 Panic disorder [episodic paroxysmal anxiety] without agoraphobia: Secondary | ICD-10-CM

## 2021-06-02 DIAGNOSIS — H6983 Other specified disorders of Eustachian tube, bilateral: Secondary | ICD-10-CM

## 2021-06-02 DIAGNOSIS — L52 Erythema nodosum: Secondary | ICD-10-CM

## 2021-06-02 DIAGNOSIS — Z8269 Family history of other diseases of the musculoskeletal system and connective tissue: Secondary | ICD-10-CM

## 2021-06-02 DIAGNOSIS — G5602 Carpal tunnel syndrome, left upper limb: Secondary | ICD-10-CM

## 2021-06-02 DIAGNOSIS — I10 Essential (primary) hypertension: Secondary | ICD-10-CM

## 2021-06-02 NOTE — Patient Instructions (Addendum)
Cervical Strain and Sprain Rehab Ask your health care provider which exercises are safe for you. Do exercises exactly as told by your health care provider and adjust them as directed. It is normal to feel mild stretching, pulling, tightness, or discomfort as you do these exercises. Stop right away if you feel sudden pain or your pain gets worse. Do not begin these exercises until told by your health care provider. Stretching and range-of-motion exercises Cervical side bending  Using good posture, sit on a stable chair or stand up. Without moving your shoulders, slowly tilt your left / right ear to your shoulder until you feel a stretch in the opposite side neck muscles. You should be looking straight ahead. Hold for __________ seconds. Repeat with the other side of your neck. Repeat __________ times. Complete this exercise __________ times a day. Cervical rotation  Using good posture, sit on a stable chair or stand up. Slowly turn your head to the side as if you are looking over your left / right shoulder. Keep your eyes level with the ground. Stop when you feel a stretch along the side and the back of your neck. Hold for __________ seconds. Repeat this by turning to your other side. Repeat __________ times. Complete this exercise __________ times a day. Thoracic extension and pectoral stretch Roll a towel or a small blanket so it is about 4 inches (10 cm) in diameter. Lie down on your back on a firm surface. Put the towel lengthwise, under your spine in the middle of your back. It should not be under your shoulder blades. The towel should line up with your spine from your middle back to your lower back. Put your hands behind your head and let your elbows fall out to your sides. Hold for __________ seconds. Repeat __________ times. Complete this exercise __________ times a day. Strengthening exercises Isometric upper cervical flexion Lie on your back with a thin pillow behind your head  and a small rolled-up towel under your neck. Gently tuck your chin toward your chest and nod your head down to look toward your feet. Do not lift your head off the pillow. Hold for __________ seconds. Release the tension slowly. Relax your neck muscles completely before you repeat this exercise. Repeat __________ times. Complete this exercise __________ times a day. Isometric cervical extension  Stand about 6 inches (15 cm) away from a wall, with your back facing the wall. Place a soft object, about 6-8 inches (15-20 cm) in diameter, between the back of your head and the wall. A soft object could be a small pillow, a ball, or a folded towel. Gently tilt your head back and press into the soft object. Keep your jaw and forehead relaxed. Hold for __________ seconds. Release the tension slowly. Relax your neck muscles completely before you repeat this exercise. Repeat __________ times. Complete this exercise __________ times a day. Posture and body mechanics Body mechanics refers to the movements and positions of your body while you do your daily activities. Posture is part of body mechanics. Good posture and healthy body mechanics can help to relieve stress in your body's tissues and joints. Good posture means that your spine is in its natural S-curve position (your spine is neutral), your shoulders are pulled back slightly, and your head is not tipped forward. The following are general guidelines for applying improved posture andbody mechanics to your everyday activities. Sitting  When sitting, keep your spine neutral and keep your feet flat on the floor. Use   a footrest, if necessary, and keep your thighs parallel to the floor. Avoid rounding your shoulders, and avoid tilting your head forward. When working at a desk or a computer, keep your desk at a height where your hands are slightly lower than your elbows. Slide your chair under your desk so you are close enough to maintain good posture. When  working at a computer, place your monitor at a height where you are looking straight ahead and you do not have to tilt your head forward or downward to look at the screen.  Standing  When standing, keep your spine neutral and keep your feet about hip-width apart. Keep a slight bend in your knees. Your ears, shoulders, and hips should line up. When you do a task in which you stand in one place for a long time, place one foot up on a stable object that is 2-4 inches (5-10 cm) high, such as a footstool. This helps keep your spine neutral.  Resting When lying down and resting, avoid positions that are most painful for you. Try to support your neck in a neutral position. You can use a contour pillow or asmall rolled-up towel. Your pillow should support your neck but not push on it. This information is not intended to replace advice given to you by your health care provider. Make sure you discuss any questions you have with your healthcare provider. Document Revised: 02/12/2019 Document Reviewed: 07/24/2018 Elsevier Patient Education  [place]. Back Exercises The following exercises strengthen the muscles that help to support the trunk and back. They also help to keep the lower back flexible. Doing these exercises can help to prevent back pain or lessen existing pain. If you have back pain or discomfort, try doing these exercises 2-3 times each day or as told by your health care provider. As your pain improves, do them once each day, but increase the number of times that you repeat the steps for each exercise (do more repetitions). To prevent the recurrence of back pain, continue to do these exercises once each day or as told by your health care provider. Do exercises exactly as told by your health care provider and adjust them as directed. It is normal to feel mild stretching, pulling, tightness, or discomfort as you do these exercises, but you should stop right away if youfeel sudden pain or  your pain gets worse. Exercises Single knee to chest Repeat these steps 3-5 times for each leg: Lie on your back on a firm bed or the floor with your legs extended. Bring one knee to your chest. Your other leg should stay extended and in contact with the floor. Hold your knee in place by grabbing your knee or thigh with both hands and hold. Pull on your knee until you feel a gentle stretch in your lower back or buttocks. Hold the stretch for 10-30 seconds. Slowly release and straighten your leg. Pelvic tilt Repeat these steps 5-10 times: Lie on your back on a firm bed or the floor with your legs extended. Bend your knees so they are pointing toward the ceiling and your feet are flat on the floor. Tighten your lower abdominal muscles to press your lower back against the floor. This motion will tilt your pelvis so your tailbone points up toward the ceiling instead of pointing to your feet or the floor. With gentle tension and even breathing, hold this position for 5-10 seconds. Cat-cow Repeat these steps until your lower back becomes more flexible: Get  into a hands-and-knees position on a firm surface. Keep your hands under your shoulders, and keep your knees under your hips. You may place padding under your knees for comfort. Let your head hang down toward your chest. Contract your abdominal muscles and point your tailbone toward the floor so your lower back becomes rounded like the back of a cat. Hold this position for 5 seconds. Slowly lift your head, let your abdominal muscles relax and point your tailbone up toward the ceiling so your back forms a sagging arch like the back of a cow. Hold this position for 5 seconds.  Press-ups Repeat these steps 5-10 times: Lie on your abdomen (face-down) on the floor. Place your palms near your head, about shoulder-width apart. Keeping your back as relaxed as possible and keeping your hips on the floor, slowly straighten your arms to raise the top  half of your body and lift your shoulders. Do not use your back muscles to raise your upper torso. You may adjust the placement of your hands to make yourself more comfortable. Hold this position for 5 seconds while you keep your back relaxed. Slowly return to lying flat on the floor.  Bridges Repeat these steps 10 times: Lie on your back on a firm surface. Bend your knees so they are pointing toward the ceiling and your feet are flat on the floor. Your arms should be flat at your sides, next to your body. Tighten your buttocks muscles and lift your buttocks off the floor until your waist is at almost the same height as your knees. You should feel the muscles working in your buttocks and the back of your thighs. If you do not feel these muscles, slide your feet 1-2 inches farther away from your buttocks. Hold this position for 3-5 seconds. Slowly lower your hips to the starting position, and allow your buttocks muscles to relax completely. If this exercise is too easy, try doing it with your arms crossed over yourchest. Abdominal crunches Repeat these steps 5-10 times: Lie on your back on a firm bed or the floor with your legs extended. Bend your knees so they are pointing toward the ceiling and your feet are flat on the floor. Cross your arms over your chest. Tip your chin slightly toward your chest without bending your neck. Tighten your abdominal muscles and slowly raise your trunk (torso) high enough to lift your shoulder blades a tiny bit off the floor. Avoid raising your torso higher than that because it can put too much stress on your low back and does not help to strengthen your abdominal muscles. Slowly return to your starting position. Back lifts Repeat these steps 5-10 times: Lie on your abdomen (face-down) with your arms at your sides, and rest your forehead on the floor. Tighten the muscles in your legs and your buttocks. Slowly lift your chest off the floor while you keep your  hips pressed to the floor. Keep the back of your head in line with the curve in your back. Your eyes should be looking at the floor. Hold this position for 3-5 seconds. Slowly return to your starting position. Contact a health care provider if: Your back pain or discomfort gets much worse when you do an exercise. Your worsening back pain or discomfort does not lessen within 2 hours after you exercise. If you have any of these problems, stop doing these exercises right away. Do not do them again unless your health care provider says that you can. Get help   right away if: You develop sudden, severe back pain. If this happens, stop doing the exercises right away. Do not do them again unless your health care provider says that you can. This information is not intended to replace advice given to you by your health care provider. Make sure you discuss any questions you have with your healthcare provider. Document Revised: 02/27/2019 Document Reviewed: 07/25/2018 Elsevier Patient Education  2022 [employer]. Journal for Nurse Practitioners, 15(4), [phone]. Retrieved August 12, 2018 from http://clinicalkey.com/nursing">  Knee Exercises Ask your health care provider which exercises are safe for you. Do exercises exactly as told by your health care provider and adjust them as directed. It is normal to feel mild stretching, pulling, tightness, or discomfort as you do these exercises. Stop right away if you feel sudden pain or your pain gets worse. Do not begin these exercises until told by your health care provider. Stretching and range-of-motion exercises These exercises warm up your muscles and joints and improve the movement and flexibility of your knee. These exercises also help to relieve pain andswelling. Knee extension, prone Lie on your abdomen (prone position) on a bed. Place your left / right knee just beyond the edge of the surface so your knee is not on the bed. You can put a towel under your  left / right thigh just above your kneecap for comfort. Relax your leg muscles and allow gravity to straighten your knee (extension). You should feel a stretch behind your left / right knee. Hold this position for __________ seconds. Scoot up so your knee is supported between repetitions. Repeat __________ times. Complete this exercise __________ times a day. Knee flexion, active  Lie on your back with both legs straight. If this causes back discomfort, bend your left / right knee so your foot is flat on the floor. Slowly slide your left / right heel back toward your buttocks. Stop when you feel a gentle stretch in the front of your knee or thigh (flexion). Hold this position for __________ seconds. Slowly slide your left / right heel back to the starting position. Repeat __________ times. Complete this exercise __________ times a day. Quadriceps stretch, prone  Lie on your abdomen on a firm surface, such as a bed or padded floor. Bend your left / right knee and hold your ankle. If you cannot reach your ankle or pant leg, loop a belt around your foot and grab the belt instead. Gently pull your heel toward your buttocks. Your knee should not slide out to the side. You should feel a stretch in the front of your thigh and knee (quadriceps). Hold this position for __________ seconds. Repeat __________ times. Complete this exercise __________ times a day. Hamstring, supine Lie on your back (supine position). Loop a belt or towel over the ball of your left / right foot. The ball of your foot is on the walking surface, right under your toes. Straighten your left / right knee and slowly pull on the belt to raise your leg until you feel a gentle stretch behind your knee (hamstring). Do not let your knee bend while you do this. Keep your other leg flat on the floor. Hold this position for __________ seconds. Repeat __________ times. Complete this exercise __________ times a day. Strengthening  exercises These exercises build strength and endurance in your knee. Endurance is theability to use your muscles for a long time, even after they get tired. Quadriceps, isometric This exercise stretches the muscles in front of your thigh (  quadriceps) without moving your knee joint (isometric). Lie on your back with your left / right leg extended and your other knee bent. Put a rolled towel or small pillow under your knee if told by your health care provider. Slowly tense the muscles in the front of your left / right thigh. You should see your kneecap slide up toward your hip or see increased dimpling just above the knee. This motion will push the back of the knee toward the floor. For __________ seconds, hold the muscle as tight as you can without increasing your pain. Relax the muscles slowly and completely. Repeat __________ times. Complete this exercise __________ times a day. Straight leg raises This exercise stretches the muscles in front of your thigh (quadriceps) and the muscles that move your hips (hip flexors). Lie on your back with your left / right leg extended and your other knee bent. Tense the muscles in the front of your left / right thigh. You should see your kneecap slide up or see increased dimpling just above the knee. Your thigh may even shake a bit. Keep these muscles tight as you raise your leg 4-6 inches (10-15 cm) off the floor. Do not let your knee bend. Hold this position for __________ seconds. Keep these muscles tense as you lower your leg. Relax your muscles slowly and completely after each repetition. Repeat __________ times. Complete this exercise __________ times a day. Hamstring, isometric Lie on your back on a firm surface. Bend your left / right knee about __________ degrees. Dig your left / right heel into the surface as if you are trying to pull it toward your buttocks. Tighten the muscles in the back of your thighs (hamstring) to "dig" as hard as you can  without increasing any pain. Hold this position for __________ seconds. Release the tension gradually and allow your muscles to relax completely for __________ seconds after each repetition. Repeat __________ times. Complete this exercise __________ times a day. Hamstring curls If told by your health care provider, do this exercise while wearing ankle weights. Begin with __________ lb weights. Then increase the weight by 1 lb (0.5 kg) increments. Do not wear ankle weights that are more than __________ lb. Lie on your abdomen with your legs straight. Bend your left / right knee as far as you can without feeling pain. Keep your hips flat against the floor. Hold this position for __________ seconds. Slowly lower your leg to the starting position. Repeat __________ times. Complete this exercise __________ times a day. Squats This exercise strengthens the muscles in front of your thigh and knee (quadriceps). Stand in front of a table, with your feet and knees pointing straight ahead. You may rest your hands on the table for balance but not for support. Slowly bend your knees and lower your hips like you are going to sit in a chair. Keep your weight over your heels, not over your toes. Keep your lower legs upright so they are parallel with the table legs. Do not let your hips go lower than your knees. Do not bend lower than told by your health care provider. If your knee pain increases, do not bend as low. Hold the squat position for __________ seconds. Slowly push with your legs to return to standing. Do not use your hands to pull yourself to standing. Repeat __________ times. Complete this exercise __________ times a day. Wall slides This exercise strengthens the muscles in front of your thigh and knee (quadriceps). Lean your back against  a smooth wall or door, and walk your feet out 18-24 inches (46-61 cm) from it. Place your feet hip-width apart. Slowly slide down the wall or door until your  knees bend __________ degrees. Keep your knees over your heels, not over your toes. Keep your knees in line with your hips. Hold this position for __________ seconds. Repeat __________ times. Complete this exercise __________ times a day. Straight leg raises This exercise strengthens the muscles that rotate the leg at the hip and move it away from your body (hip abductors). Lie on your side with your left / right leg in the top position. Lie so your head, shoulder, knee, and hip line up. You may bend your bottom knee to help you keep your balance. Roll your hips slightly forward so your hips are stacked directly over each other and your left / right knee is facing forward. Leading with your heel, lift your top leg 4-6 inches (10-15 cm). You should feel the muscles in your outer hip lifting. Do not let your foot drift forward. Do not let your knee roll toward the ceiling. Hold this position for __________ seconds. Slowly return your leg to the starting position. Let your muscles relax completely after each repetition. Repeat __________ times. Complete this exercise __________ times a day. Straight leg raises This exercise stretches the muscles that move your hips away from the front of the pelvis (hip extensors). Lie on your abdomen on a firm surface. You can put a pillow under your hips if that is more comfortable. Tense the muscles in your buttocks and lift your left / right leg about 4-6 inches (10-15 cm). Keep your knee straight as you lift your leg. Hold this position for __________ seconds. Slowly lower your leg to the starting position. Let your leg relax completely after each repetition. Repeat __________ times. Complete this exercise __________ times a day. This information is not intended to replace advice given to you by your health care provider. Make sure you discuss any questions you have with your healthcare provider. Document Revised: 08/13/2018 Document Reviewed:  08/13/2018 Elsevier Patient Education  2022 [employer].

## 2021-06-02 NOTE — Telephone Encounter (Signed)
Attempted to contact patient and left message on machine to advise patient of x-ray results from today, per Dr. Corliss Skains.

## 2021-06-03 NOTE — Telephone Encounter (Signed)
Pt.notified

## 2021-06-03 NOTE — Telephone Encounter (Signed)
Did not answer call. Voicemail left to call back

## 2021-06-03 NOTE — Telephone Encounter (Signed)
Pt reported swollen lymph node. Would like to get referral done for diagnostic

## 2021-06-11 LAB — PROTEIN ELECTROPHORESIS, SERUM, WITH REFLEX
Albumin ELP: 3.9 g/dL (ref 3.8–4.8)
Alpha 1: 0.3 g/dL (ref 0.2–0.3)
Alpha 2: 0.8 g/dL (ref 0.5–0.9)
Beta 2: 0.3 g/dL (ref 0.2–0.5)
Beta Globulin: 0.6 g/dL (ref 0.4–0.6)
Gamma Globulin: 1 g/dL (ref 0.8–1.7)
Total Protein: 6.9 g/dL (ref 6.1–8.1)

## 2021-06-11 LAB — IGG, IGA, IGM
IgG (Immunoglobin G), Serum: 1120 mg/dL (ref 600–1640)
IgM, Serum: 75 mg/dL (ref 50–300)
Immunoglobulin A: 128 mg/dL (ref 47–310)

## 2021-06-11 LAB — URIC ACID: Uric Acid, Serum: 5.3 mg/dL (ref 2.5–7.0)

## 2021-06-11 LAB — 14-3-3 ETA PROTEIN: 14-3-3 eta Protein: <0.2 ng/mL (ref <0.2)

## 2021-06-11 LAB — CK: Total CK: 160 U/L — ABNORMAL HIGH (ref 29–143)

## 2021-06-11 LAB — SEDIMENTATION RATE: Sed Rate: 14 mm/h (ref 0–20)

## 2021-06-11 LAB — CYCLIC CITRUL PEPTIDE ANTIBODY, IGG: Cyclic Citrullin Peptide Ab: <16 UNITS

## 2021-06-11 NOTE — Progress Notes (Signed)
Office Visit Note  Patient: Morgan Molina             Date of Birth: July 20, 1976           MRN: [id number]             PCP: Mack Hook, MD Referring: Mack Hook, MD Visit Date: 06/23/2021 Occupation: @GUAROCC @  Subjective:  Pain in multiple joints.   History of Present Illness: Morgan Molina is a 45 y.o. female returns today for evaluation of pain in multiple joints.  She states she continues to have discomfort in her neck and lower back.  She has discomfort in her hands and knees.  She has not noticed any joint swelling.  Activities of Daily Living:  Patient reports morning stiffness for all day. Patient Reports nocturnal pain.  Difficulty dressing/grooming: Reports Difficulty climbing stairs: Reports Difficulty getting out of chair: Reports Difficulty using hands for taps, buttons, cutlery, and/or writing: Reports  Review of Systems  Constitutional:  Positive for fatigue.  HENT:  Negative for mouth sores, mouth dryness and nose dryness.   Eyes:  Negative for pain, itching and dryness.  Respiratory:  Positive for shortness of breath. Negative for difficulty breathing.   Cardiovascular:  Positive for palpitations. Negative for chest pain.  Gastrointestinal:  Negative for blood in stool, constipation and diarrhea.  Endocrine: Negative for increased urination.  Genitourinary:  Negative for difficulty urinating.  Musculoskeletal:  Positive for joint pain, joint pain, joint swelling, myalgias, morning stiffness, muscle tenderness and myalgias.  Skin:  Negative for color change, rash and redness.  Allergic/Immunologic: Positive for susceptible to infections.  Neurological:  Positive for dizziness, numbness, parasthesias and weakness. Negative for headaches and memory loss.  Hematological:  Positive for bruising/bleeding tendency.  Psychiatric/Behavioral:  Negative for confusion. The patient is nervous/anxious.    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   Arthralgia 03/30/2021   Hyperglycemia 03/30/2021   Shortness of breath 03/30/2021   Bilateral chronic serous otitis media 08/15/2020   Eustachian tube dysfunction, bilateral 08/15/2020   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   Ganglion cyst of dorsum of left wrist 10/01/2019   Decreased visual acuity 10/01/2019   PTSD (post-traumatic stress disorder) 09/28/2019   Nipple discharge 04/02/2019   Panic attacks 02/19/2019   Bilateral low back pain 02/19/2019   Seasonal allergies 02/19/2019   Anxiety and depression 01/14/2019   Syncope 01/14/2019   Breast mass, right 01/14/2019   Hypertension     Past Medical History:  Diagnosis Date   Anxiety    Depression    Hypertension    Muscle spasm    PTSD (post-traumatic stress disorder) 09/28/2019   Syncope     Family History  Problem Relation Age of Onset   Cancer Mother    Hypertension Mother    Heart attack Mother    Breast cancer Mother 42   CAD Father    Hypertension Father    Diabetes Father    Past Surgical History:  Procedure Laterality Date   BREAST EXCISIONAL BIOPSY Right    CARPAL TUNNEL RELEASE Left 02/20/2020   Procedure: LEFT CARPAL TUNNEL RELEASE;  Surgeon: Leanora Cover, MD;  Location: [facility];  Service: Orthopedics;  Laterality: Left;   CARPAL TUNNEL RELEASE Right 02/28/2021   Procedure: CARPAL TUNNEL RELEASE;  Surgeon:  Leanora Cover, MD;  Location: [facility];  Service: Orthopedics;  Laterality: Right;   CHOLECYSTECTOMY     GANGLION CYST EXCISION Left 02/20/2020   Procedure: LEFT DORSAL GANGLION EXCISION;  Surgeon: Leanora Cover, MD;  Location: [facility];  Service: Orthopedics;  Laterality: Left;   TUBAL LIGATION     Social History   Social History Narrative   Lives at home with family.    Right-handed.   No caffeine.   Immunization History  Administered Date(s) Administered   DTaP 04/25/1976, 07/18/1976, 12/12/1976, 01/15/1980   MMR 01/15/1980   OPV 04/25/1976, 07/18/1976, 01/15/1980   Td 11/30/1997     Objective: Vital Signs: BP (!) 147/94 (BP Location: Left Wrist, Patient Position: Sitting, Cuff Size: Normal)   Pulse 78   Ht _0  (1.626 m)   Wt 295 lb (133.8 kg)   BMI 50.64 kg/m    Physical Exam Vitals and nursing note reviewed.  Constitutional:      Appearance: She is well-developed.  HENT:     Head: Normocephalic and atraumatic.  Eyes:     Conjunctiva/sclera: Conjunctivae normal.  Cardiovascular:     Rate and Rhythm: Normal rate and regular rhythm.     Heart sounds: Normal heart sounds.  Pulmonary:     Effort: Pulmonary effort is normal.     Breath sounds: Normal breath sounds.  Abdominal:     General: Bowel sounds are normal.     Palpations: Abdomen is soft.  Musculoskeletal:     Cervical back: Normal range of motion.  Lymphadenopathy:     Cervical: No cervical adenopathy.  Skin:    General: Skin is warm and dry.     Capillary Refill: Capillary refill takes less than 2 seconds.  Neurological:     Mental Status: She is alert and oriented to person, place, and time.  Psychiatric:        Behavior: Behavior normal.     Musculoskeletal Exam: She had discomfort range of motion of cervical spine and lumbar spine.  Shoulder joints and elbow joints in good range of motion.  She had tenderness over PIPs and DIP joints.  She had discomfort in her bilateral knee joints without any warmth swelling or effusion.  CDAI Exam: CDAI Score: -- Patient Global: --; Provider Global: -- Swollen: --; Tender: -- Joint Exam 06/23/2021   No joint exam has been documented for this visit   There is currently no information documented on the homunculus. Go to the Rheumatology activity and complete the homunculus joint exam.  Investigation: No additional  findings.  Imaging: XR Cervical Spine 2 or 3 views  Result Date: 06/02/2021 Multilevel spondylosis with anterior spurring and osteophytes was noted. Impression: These findings are consistent with degenerative disc disease of cervical spine.  XR Hand 2 View Left  Result Date: 06/02/2021 CMC, PIP and DIP narrowing was noted.  No MCP, intercarpal or radiocarpal joint space narrowing was noted.  No erosive changes were noted. Impression: These findings are consistent with osteoarthritis of the hand.  XR Hand 2 View Right  Result Date: 06/02/2021 CMC, PIP and DIP narrowing was noted.  No MCP, intercarpal or radiocarpal joint space narrowing was noted.  No erosive changes were noted. Impression: These findings are consistent with osteoarthritis of the hand.  XR KNEE 3 VIEW LEFT  Result Date: 06/02/2021 Mild medial compartment narrowing with medial osteophytes was noted.  Moderate patellofemoral narrowing was noted.  No chondrocalcinosis was noted. Impression: These findings are consistent with  mild osteoarthritis and moderate chondromalacia patella.  XR KNEE 3 VIEW RIGHT  Result Date: 06/11/2021 Mild medial compartment narrowing with medial osteophytes was noted.  No chondrocalcinosis was noted.  Moderate patellofemoral narrowing was noted. Impression: These findings are consistent with mild osteoarthritis and moderate chondromalacia patella.  XR Lumbar Spine 2-3 Views  Result Date: 06/02/2021 Anterior spurring was noted.  Mild L1-L2 and L2-L3 narrowing was noted.  Facet joint arthropathy was noted.  Impression: These findings are consistent with dextroscoliosis degenerative disc disease and facet joint arthropathy.   Recent Labs: Lab Results  Component Value Date   WBC 6.8 02/14/2021   HGB 10.4 (L) 02/14/2021   PLT 359 02/14/2021   NA 142 02/14/2021   K 4.0 02/14/2021   CL 100 02/14/2021   CO2 20 02/14/2021   GLUCOSE 128 (H) 02/14/2021   BUN 7 02/14/2021   CREATININE 0.72 02/14/2021    BILITOT 0.2 02/14/2021   ALKPHOS 51 02/14/2021   AST 16 02/14/2021   ALT 12 02/14/2021   PROT 6.9 06/02/2021   ALBUMIN 4.1 02/14/2021   CALCIUM 9.4 02/14/2021   GFRAA >60 02/17/2020    02/14/21: ESR 71, CRP 3.31, Ace 22, ASO 43.0. 03/25/21: ANA negative, RF negative.    June 02, 2021 SPEP normal, immunoglobulins normal, CK160, ESR 14, anti-CCP negative, uric acid 5.3  Speciality Comments: No specialty comments available.  Procedures:  No procedures performed Allergies: Latex, Lisinopril, and Adhesive [tape]   Assessment / Plan:     Visit Diagnoses: Primary osteoarthritis of both hands - Clinical and radiographic findings are consistent with osteoarthritis.  She continues to have pain and discomfort in her hands.  I will refer her to physical therapy.  A handout on exercises was given.- Plan: Ambulatory referral to Physical Medicine Rehab  Chondromalacia of both patellae - Moderate chondromalacia of both patella was noted.  Mild osteoarthritis of both knees was noted.  Detailed counseling regarding osteoarthritis and chondromalacia patella was provided.  A handout on exercises was given.- Plan: Ambulatory referral to Physical Therapy, Ambulatory referral to Physical Medicine Rehab  DDD (degenerative disc disease), cervical - X-ray showed degenerative changes and facet joint arthropathy.  A handout on neck exercises was given at the last visit. -She continues to have neck discomfort.  Per her request we will refer her to water therapy.  Have given her a handout on exercises.  Plan: Ambulatory referral to Physical Therapy, Ambulatory referral to Physical Medicine Rehab  Arthropathy of lumbar facet joint - She gives history of chronic lower back pain and difficulty walking.  Facet joint arthropathy was noted.  She continues to have lower back pain.  She has difficulty walking and doing routine activities.  She states she is in constant discomfort.  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  No rash was noted at the last visit.  She had no rash on my examination.   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  She was referred to pulm  Primary hypertension-blood pressure is elevated.  Bilateral chronic serous otitis media - History of recurrent infections.  She was referred to ENT.  PTSD (post-traumatic stress disorder)  Panic attacks  Anxiety and depression  Family history of systemic lupus erythematosus in her brother and maternal aunt  Orders: Orders Placed This Encounter  Procedures   Ambulatory referral to Physical Therapy   Ambulatory referral to Physical Medicine Rehab    No orders of the defined types were placed in this encounter.  Follow-Up Instructions: Return in about 6 months (around 12/24/2021) for Osteoarthritis, DDD.   Bo Merino, MD  Note - This record has been created using Editor, commissioning.  Chart creation errors have been sought, but may not always  have been located. Such creation errors do not reflect on  the standard of medical care.

## 2021-06-13 NOTE — Progress Notes (Signed)
CK is mildly elevated.  All other labs are within normal limits.  I will discuss results at the follow-up visit.

## 2021-06-16 ENCOUNTER — Other Ambulatory Visit: Payer: Self-pay

## 2021-06-16 ENCOUNTER — Encounter: Payer: Self-pay | Admitting: Internal Medicine

## 2021-06-16 ENCOUNTER — Ambulatory Visit (INDEPENDENT_AMBULATORY_CARE_PROVIDER_SITE_OTHER): Payer: BC Managed Care – PPO | Admitting: Internal Medicine

## 2021-06-16 VITALS — BP 132/74 | HR 80 | Resp 12 | Ht 63.0 in | Wt 292.0 lb

## 2021-06-16 DIAGNOSIS — M6281 Muscle weakness (generalized): Secondary | ICD-10-CM

## 2021-06-16 DIAGNOSIS — R7303 Prediabetes: Secondary | ICD-10-CM

## 2021-06-16 DIAGNOSIS — R5383 Other fatigue: Secondary | ICD-10-CM

## 2021-06-16 DIAGNOSIS — M62838 Other muscle spasm: Secondary | ICD-10-CM

## 2021-06-16 DIAGNOSIS — G8929 Other chronic pain: Secondary | ICD-10-CM

## 2021-06-16 DIAGNOSIS — M5442 Lumbago with sciatica, left side: Secondary | ICD-10-CM | POA: Diagnosis not present

## 2021-06-16 DIAGNOSIS — R1310 Dysphagia, unspecified: Secondary | ICD-10-CM

## 2021-06-16 DIAGNOSIS — M5441 Lumbago with sciatica, right side: Secondary | ICD-10-CM

## 2021-06-16 DIAGNOSIS — M791 Myalgia, unspecified site: Secondary | ICD-10-CM | POA: Diagnosis not present

## 2021-06-16 MED ORDER — GABAPENTIN 100 MG PO CAPS: ORAL_CAPSULE | ORAL | 3 refills | Status: DC

## 2021-06-16 NOTE — Patient Instructions (Signed)
Gabapentin: Start taking Gabapentin 100 mg cap 1 cap at bedtime. In 3 days, increase to 2 caps at bedtime. In another 3 days, increase to 3 caps at bedtime You should be taking 3 caps at bedtime at this point.  In 3 days, start another 1 cap in the morning In 3 more days, increase to 2 caps in the morning In 3 more days, increase to 3 caps in the morning:  You should be taking 3 caps in the morning and 3 caps at bedtime at this point.  In 3 days, start another dose midday--1 cap In 3 more days, increase to 2 caps midday In 3 more days, increase to 3 caps midday. You should be taking 3 caps 3 times daily at this point.  Stay on this dose until follow up If you do not tolerate increasing the dose at any point, hold on the dose you tolerate or call clinic if having problems 

## 2021-06-16 NOTE — Progress Notes (Signed)
Subjective:    Patient ID: Morgan Molina, female   DOB: 01-12-1976, 45 y.o.   MRN: [id number]   HPI.    Rheumatology Evaluation for concern for Erythema Nodosum with polyarthralgias and elevated Sed Rate/CRP:  was seen by Dr. Corliss Skains 06/02/21 and has follow up appt on Oct 18th.   Patient presented to this office 02/14/21 with skin lesions thought to possibly represent Erythema Nodosum. On follow up, she gave a history for diagnosis of Sarcoid in 1995 with findings of hilar and paratracheal lymphadenopathy with dyspnea by history, which from scant old records and patient account, was treated successfully with prednisone for many months which was then tapered.    Labs on 02/14/21:  Sed Rate:  71 (H) CRP:  3.31 (H) Hgb:  10.4 with normocytic indices ASO titre:  43.0 (Normal) ACE:  22 (Normal) Glucose:  128 CXR noted at OV from 05/27/2019:  normal.  Follow up studies 03/25/21 when began reporting polyarthralgias:    03/25/21:   urine calcium/crea:  61 (normal) A1C:  6.1% (prediabetic) ANA:  negative RF:  <10 (negative)  03/28/21:  CXR:  normal.  No lymphadenopathy noted.  Discussed her skin and joint complaints not felt to be symptoms of sarcoid with above labs/studies, but could not explain etiology, so referral to Dr. Corliss Skains.  With Dr. Fatima Sanger recent evaluation:    Sed Rate has normalized to 17. Cyclic Citrullin Peptide:  <16 (normal)  XRays thus far of C spine, L/S spine, hands show mainly DJD/DDD issues and no changes to support inflammatory arthritis.   CPK :  160: slightly elevated.  Has been referred to [facility], pulmonology as patient continues to have skin lesions, and now shortness of breath, which she relates to when her neck is bothering her with pain and heaviness.  Describes just fatigue from holding up her neck and head. Describes neck with bilateral arm pain as well, but more prominently,  with heaviness of arms and associated symptoms of difficulty getting  a breath and swallowing.   Would say looking back, developed problems with swallowing about a year ago which she also associates with ear complaints--ear plugging and pressure. (felt to have intermittent eustachian tube dysfunction) and over last 3 months, the swallowing difficulties have worsened. Head feels heavy and has to hold head back for relief of heaviness and then more difficulty with breathing and swallowing.   Difficulty with swallowing is intermittent as above and describes problems with liquids, including saliva as well as solids, particularly breads.  No definite inappropriate yawning, crying, laughing.   Complains of weakness in legs more so than arms, but as above, arms with fatigue/heaviness as well. Right side is worse in her LE and left side worse with UE.   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 Not clear has actual trismus, but does have tightness of jaw leading to inability to opening mouth fully. When back at work in July, began noting some slurring of speech at times.  She is constantly speaking with customers. Takes long time getting ready in morning with bathing dressing due to weakness/heaviness and discomfort.  Complains of muscle spasm in back and extremities.  Denies actual fasciculations.  Tried Cyclobenzaprine from family member and no help with muscle spasm.  Did not help with sleep as well.  Ibuprofen and Tylenol without help.    Shows me photo of lateral left  thigh and calf with cluster of purple round lesions like bruising--reportedly turn red as resolve, which is opposite of color change when described in April.  States photos from 1 week ago and there for 2-3 days.  2.  Prediabetes:  A1C last 6.1% in MAY.  Current Meds  Medication Sig   IBUPROFEN PO Take by mouth as needed.   irbesartan-hydrochlorothiazide (AVALIDE) 150-12.5 MG tablet Take 1 tablet by mouth daily.    levocetirizine (XYZAL) 5 MG tablet Take 5 mg by mouth as needed.   sertraline (ZOLOFT) 50 MG tablet Take 1 tablet (50 mg total) by mouth daily.   Allergies  Allergen Reactions   Latex Dermatitis   Lisinopril Cough   Adhesive [Tape] Rash     Review of Systems    Objective:   BP 132/74 (BP Location: Left Arm, Patient Position: Sitting, Cuff Size: Large)   Pulse 80   Resp 12   Ht 5\' 3"  (1.6 m)   Wt 292 lb (132.5 kg)   BMI 51.73 kg/m   Physical Exam Walks about in exam room mildly hunched over with discomfort from neck/back HEENT:  PERRL, EOMI, TMs pearly gray, throat without injection Neck:  Supple, No adenopathy, thyroid mildly enlarged, no mass. Chest:  CTA, no accessory resp muscle use and no tachypnea CV:  RRR without murmur or rub.  Radial and DP pulses normal and equal Abd:  S, NT, No HSM or mass, + BS Skin:  no lesions.  No dryness. Neuro:  A & O x 3, CN II-XII grossly intact.  Motor 5/5, DTRs 2+/4 throughout.  Good coordination and strength of fingers with picking up paperclips and transferring to palm bilaterally.  Rapid alternating motions, finger to nose to finger, heel to shin all normal.  Romberg negative.  Unable to stand from normal level seat without using UE to push out. No muscular fasciculation noted.   Assessment & Plan  Concerned with neuromuscular abnormality than musculoskeletal based on complaints today, though exam not obviously abnormal.   Spoke with at [facility]--she will put her on wait list with Dr. Seward Grater, but has appt set for 3 weeks from now and not as far out as thought. TSH For chronic neck and back pain:  Begin Gabapentin and titrate to 300 mg 3 times daily.  Follow up in 1 month.  2.  Mild thyromegaly/fatigue:  TSH  3.  Prediabetes:  A1C.    4.  Paperwork for work limitations.

## 2021-06-17 LAB — TSH: TSH: 0.773 u[IU]/mL (ref 0.450–4.500)

## 2021-06-17 LAB — HGB A1C W/O EAG: Hgb A1c MFr Bld: 5.9 % — ABNORMAL HIGH (ref 4.8–5.6)

## 2021-06-22 ENCOUNTER — Telehealth: Payer: Self-pay

## 2021-06-22 NOTE — Telephone Encounter (Signed)
Notify Morgan Molina:  The forms are not asking for specific dates needed off, they are asking how often you need to be off for health evaluations and estimation of how many hours for each visit, which I estimated.   Not sure I understand the need to be off 24 hours per month. Will look at the paperwork again and see if anything needs changing.

## 2021-06-22 NOTE — Telephone Encounter (Signed)
Pt call to inform that the job accomodation form was filled out with the wrong dates. Need dates added for appts on July 28, August 11, and August 18 and future time off which is 24hrs a month. Pt reported that the from is due 06/27/21

## 2021-06-23 ENCOUNTER — Encounter: Payer: Self-pay | Admitting: *Deleted

## 2021-06-23 ENCOUNTER — Other Ambulatory Visit: Payer: Self-pay

## 2021-06-23 ENCOUNTER — Encounter: Payer: Self-pay | Admitting: Neurology

## 2021-06-23 ENCOUNTER — Ambulatory Visit (INDEPENDENT_AMBULATORY_CARE_PROVIDER_SITE_OTHER): Payer: BC Managed Care – PPO | Admitting: Rheumatology

## 2021-06-23 ENCOUNTER — Encounter: Payer: Self-pay | Admitting: Rheumatology

## 2021-06-23 ENCOUNTER — Ambulatory Visit (INDEPENDENT_AMBULATORY_CARE_PROVIDER_SITE_OTHER): Payer: BC Managed Care – PPO | Admitting: Neurology

## 2021-06-23 VITALS — BP 147/94 | HR 78 | Ht 64.0 in | Wt 295.0 lb

## 2021-06-23 VITALS — BP 131/80 | HR 88 | Ht 63.0 in | Wt 297.0 lb

## 2021-06-23 DIAGNOSIS — R404 Transient alteration of awareness: Secondary | ICD-10-CM | POA: Diagnosis not present

## 2021-06-23 DIAGNOSIS — F419 Anxiety disorder, unspecified: Secondary | ICD-10-CM

## 2021-06-23 DIAGNOSIS — R202 Paresthesia of skin: Secondary | ICD-10-CM | POA: Diagnosis not present

## 2021-06-23 DIAGNOSIS — L52 Erythema nodosum: Secondary | ICD-10-CM

## 2021-06-23 DIAGNOSIS — M19042 Primary osteoarthritis, left hand: Secondary | ICD-10-CM

## 2021-06-23 DIAGNOSIS — M19041 Primary osteoarthritis, right hand: Secondary | ICD-10-CM

## 2021-06-23 DIAGNOSIS — M2241 Chondromalacia patellae, right knee: Secondary | ICD-10-CM

## 2021-06-23 DIAGNOSIS — M47816 Spondylosis without myelopathy or radiculopathy, lumbar region: Secondary | ICD-10-CM

## 2021-06-23 DIAGNOSIS — H6523 Chronic serous otitis media, bilateral: Secondary | ICD-10-CM

## 2021-06-23 DIAGNOSIS — Z8269 Family history of other diseases of the musculoskeletal system and connective tissue: Secondary | ICD-10-CM

## 2021-06-23 DIAGNOSIS — F4310 Post-traumatic stress disorder, unspecified: Secondary | ICD-10-CM

## 2021-06-23 DIAGNOSIS — M2242 Chondromalacia patellae, left knee: Secondary | ICD-10-CM

## 2021-06-23 DIAGNOSIS — I10 Essential (primary) hypertension: Secondary | ICD-10-CM

## 2021-06-23 DIAGNOSIS — R700 Elevated erythrocyte sedimentation rate: Secondary | ICD-10-CM

## 2021-06-23 DIAGNOSIS — M5030 Other cervical disc degeneration, unspecified cervical region: Secondary | ICD-10-CM | POA: Diagnosis not present

## 2021-06-23 DIAGNOSIS — F410 Panic disorder [episodic paroxysmal anxiety] without agoraphobia: Secondary | ICD-10-CM

## 2021-06-23 DIAGNOSIS — R0602 Shortness of breath: Secondary | ICD-10-CM

## 2021-06-23 DIAGNOSIS — M542 Cervicalgia: Secondary | ICD-10-CM

## 2021-06-23 DIAGNOSIS — F32A Depression, unspecified: Secondary | ICD-10-CM

## 2021-06-23 NOTE — Telephone Encounter (Signed)
Pt notified. Pt described that the forms do ask for specific dates. Expressed that she is going to lose her job for the absence at work

## 2021-06-23 NOTE — Patient Instructions (Signed)
Journal for Nurse Practitioners, 15(4), 263-267. Retrieved August 12, 2018 from [email or web address]">  Knee Exercises Ask your health care provider which exercises are safe for you. Do exercises exactly as told by your health care provider and adjust them as directed. It is normal to feel mild stretching, pulling, tightness, or discomfort as you do these exercises. Stop right away if you feel sudden pain or your pain gets worse. Do not begin these exercises until told by your health care provider. Stretching and range-of-motion exercises These exercises warm up your muscles and joints and improve the movement and flexibility of your knee. These exercises also help to relieve pain andswelling. Knee extension, prone Lie on your abdomen (prone position) on a bed. Place your left / right knee just beyond the edge of the surface so your knee is not on the bed. You can put a towel under your left / right thigh just above your kneecap for comfort. Relax your leg muscles and allow gravity to straighten your knee (extension). You should feel a stretch behind your left / right knee. Hold this position for __________ seconds. Scoot up so your knee is supported between repetitions. Repeat __________ times. Complete this exercise __________ times a day. Knee flexion, active  Lie on your back with both legs straight. If this causes back discomfort, bend your left / right knee so your foot is flat on the floor. Slowly slide your left / right heel back toward your buttocks. Stop when you feel a gentle stretch in the front of your knee or thigh (flexion). Hold this position for __________ seconds. Slowly slide your left / right heel back to the starting position. Repeat __________ times. Complete this exercise __________ times a day. Quadriceps stretch, prone  Lie on your abdomen on a firm surface, such as a bed or padded floor. Bend your left / right knee and hold your ankle. If you cannot reach  your ankle or pant leg, loop a belt around your foot and grab the belt instead. Gently pull your heel toward your buttocks. Your knee should not slide out to the side. You should feel a stretch in the front of your thigh and knee (quadriceps). Hold this position for __________ seconds. Repeat __________ times. Complete this exercise __________ times a day. Hamstring, supine Lie on your back (supine position). Loop a belt or towel over the ball of your left / right foot. The ball of your foot is on the walking surface, right under your toes. Straighten your left / right knee and slowly pull on the belt to raise your leg until you feel a gentle stretch behind your knee (hamstring). Do not let your knee bend while you do this. Keep your other leg flat on the floor. Hold this position for __________ seconds. Repeat __________ times. Complete this exercise __________ times a day. Strengthening exercises These exercises build strength and endurance in your knee. Endurance is theability to use your muscles for a long time, even after they get tired. Quadriceps, isometric This exercise stretches the muscles in front of your thigh (quadriceps) without moving your knee joint (isometric). Lie on your back with your left / right leg extended and your other knee bent. Put a rolled towel or small pillow under your knee if told by your health care provider. Slowly tense the muscles in the front of your left / right thigh. You should see your kneecap slide up toward your hip or see increased dimpling just above the knee. This motion will   push the back of the knee toward the floor. For __________ seconds, hold the muscle as tight as you can without increasing your pain. Relax the muscles slowly and completely. Repeat __________ times. Complete this exercise __________ times a day. Straight leg raises This exercise stretches the muscles in front of your thigh (quadriceps) and the muscles that move your hips (hip  flexors). Lie on your back with your left / right leg extended and your other knee bent. Tense the muscles in the front of your left / right thigh. You should see your kneecap slide up or see increased dimpling just above the knee. Your thigh may even shake a bit. Keep these muscles tight as you raise your leg 4-6 inches (10-15 cm) off the floor. Do not let your knee bend. Hold this position for __________ seconds. Keep these muscles tense as you lower your leg. Relax your muscles slowly and completely after each repetition. Repeat __________ times. Complete this exercise __________ times a day. Hamstring, isometric Lie on your back on a firm surface. Bend your left / right knee about __________ degrees. Dig your left / right heel into the surface as if you are trying to pull it toward your buttocks. Tighten the muscles in the back of your thighs (hamstring) to "dig" as hard as you can without increasing any pain. Hold this position for __________ seconds. Release the tension gradually and allow your muscles to relax completely for __________ seconds after each repetition. Repeat __________ times. Complete this exercise __________ times a day. Hamstring curls If told by your health care provider, do this exercise while wearing ankle weights. Begin with __________ lb weights. Then increase the weight by 1 lb (0.5 kg) increments. Do not wear ankle weights that are more than __________ lb. Lie on your abdomen with your legs straight. Bend your left / right knee as far as you can without feeling pain. Keep your hips flat against the floor. Hold this position for __________ seconds. Slowly lower your leg to the starting position. Repeat __________ times. Complete this exercise __________ times a day. Squats This exercise strengthens the muscles in front of your thigh and knee (quadriceps). Stand in front of a table, with your feet and knees pointing straight ahead. You may rest your hands on the  table for balance but not for support. Slowly bend your knees and lower your hips like you are going to sit in a chair. Keep your weight over your heels, not over your toes. Keep your lower legs upright so they are parallel with the table legs. Do not let your hips go lower than your knees. Do not bend lower than told by your health care provider. If your knee pain increases, do not bend as low. Hold the squat position for __________ seconds. Slowly push with your legs to return to standing. Do not use your hands to pull yourself to standing. Repeat __________ times. Complete this exercise __________ times a day. Wall slides This exercise strengthens the muscles in front of your thigh and knee (quadriceps). Lean your back against a smooth wall or door, and walk your feet out 18-24 inches (46-61 cm) from it. Place your feet hip-width apart. Slowly slide down the wall or door until your knees bend __________ degrees. Keep your knees over your heels, not over your toes. Keep your knees in line with your hips. Hold this position for __________ seconds. Repeat __________ times. Complete this exercise __________ times a day. Straight leg raises This exercise   strengthens the muscles that rotate the leg at the hip and move it away from your body (hip abductors). Lie on your side with your left / right leg in the top position. Lie so your head, shoulder, knee, and hip line up. You may bend your bottom knee to help you keep your balance. Roll your hips slightly forward so your hips are stacked directly over each other and your left / right knee is facing forward. Leading with your heel, lift your top leg 4-6 inches (10-15 cm). You should feel the muscles in your outer hip lifting. Do not let your foot drift forward. Do not let your knee roll toward the ceiling. Hold this position for __________ seconds. Slowly return your leg to the starting position. Let your muscles relax completely after each  repetition. Repeat __________ times. Complete this exercise __________ times a day. Straight leg raises This exercise stretches the muscles that move your hips away from the front of the pelvis (hip extensors). Lie on your abdomen on a firm surface. You can put a pillow under your hips if that is more comfortable. Tense the muscles in your buttocks and lift your left / right leg about 4-6 inches (10-15 cm). Keep your knee straight as you lift your leg. Hold this position for __________ seconds. Slowly lower your leg to the starting position. Let your leg relax completely after each repetition. Repeat __________ times. Complete this exercise __________ times a day. This information is not intended to replace advice given to you by your health care provider. Make sure you discuss any questions you have with your healthcare provider. Document Revised: 08/13/2018 Document Reviewed: 08/13/2018 [employer] Patient Education  2022 [employer] Hand Exercises Hand exercises can be helpful for almost anyone. These exercises can strengthen the hands, improve flexibility and movement, and increase blood flow to the hands. These results can make work and daily tasks easier. Hand exercises can be especially helpful for people who have joint pain from arthritis or have nerve damage from overuse (carpal tunnel syndrome). These exercises can also help people who have injured a hand. Exercises Most of these hand exercises are gentle stretching and motion exercises. It is usually safe to do them often throughout the day. Warming up your hands before exercise may help to reduce stiffness. You can do this with gentle massage orby placing your hands in warm water for 10-15 minutes. It is normal to feel some stretching, pulling, tightness, or mild discomfort as you begin new exercises. This will gradually improve. Stop an exercise right away if you feel sudden, severe pain or your pain gets worse. Ask your healthcare  provider which exercises are best for you. Knuckle bend or "claw" fist Stand or sit with your arm, hand, and all five fingers pointed straight up. Make sure to keep your wrist straight during the exercise. Gently bend your fingers down toward your palm until the tips of your fingers are touching the top of your palm. Keep your big knuckle straight and just bend the small knuckles in your fingers. Hold this position for __________ seconds. Straighten (extend) your fingers back to the starting position. Repeat this exercise 5-10 times with each hand. Full finger fist Stand or sit with your arm, hand, and all five fingers pointed straight up. Make sure to keep your wrist straight during the exercise. Gently bend your fingers into your palm until the tips of your fingers are touching the middle of your palm. Hold this position for __________ seconds.   Extend your fingers back to the starting position, stretching every joint fully. Repeat this exercise 5-10 times with each hand. Straight fist Stand or sit with your arm, hand, and all five fingers pointed straight up. Make sure to keep your wrist straight during the exercise. Gently bend your fingers at the big knuckle, where your fingers meet your hand, and the middle knuckle. Keep the knuckle at the tips of your fingers straight and try to touch the bottom of your palm. Hold this position for __________ seconds. Extend your fingers back to the starting position, stretching every joint fully. Repeat this exercise 5-10 times with each hand. Tabletop Stand or sit with your arm, hand, and all five fingers pointed straight up. Make sure to keep your wrist straight during the exercise. Gently bend your fingers at the big knuckle, where your fingers meet your hand, as far down as you can while keeping the small knuckles in your fingers straight. Think of forming a tabletop with your fingers. Hold this position for __________ seconds. Extend your fingers  back to the starting position, stretching every joint fully. Repeat this exercise 5-10 times with each hand. Finger spread Place your hand flat on a table with your palm facing down. Make sure your wrist stays straight as you do this exercise. Spread your fingers and thumb apart from each other as far as you can until you feel a gentle stretch. Hold this position for __________ seconds. Bring your fingers and thumb tight together again. Hold this position for __________ seconds. Repeat this exercise 5-10 times with each hand. Making circles Stand or sit with your arm, hand, and all five fingers pointed straight up. Make sure to keep your wrist straight during the exercise. Make a circle by touching the tip of your thumb to the tip of your index finger. Hold for __________ seconds. Then open your hand wide. Repeat this motion with your thumb and each finger on your hand. Repeat this exercise 5-10 times with each hand. Thumb motion Sit with your forearm resting on a table and your wrist straight. Your thumb should be facing up toward the ceiling. Keep your fingers relaxed as you move your thumb. Lift your thumb up as high as you can toward the ceiling. Hold for __________ seconds. Bend your thumb across your palm as far as you can, reaching the tip of your thumb for the small finger (pinkie) side of your palm. Hold for __________ seconds. Repeat this exercise 5-10 times with each hand. Grip strengthening  Hold a stress ball or other soft ball in the middle of your hand. Slowly increase the pressure, squeezing the ball as much as you can without causing pain. Think of bringing the tips of your fingers into the middle of your palm. All of your finger joints should bend when doing this exercise. Hold your squeeze for __________ seconds, then relax. Repeat this exercise 5-10 times with each hand. Contact a health care provider if: Your hand pain or discomfort gets much worse when you do an  exercise. Your hand pain or discomfort does not improve within 2 hours after you exercise. If you have any of these problems, stop doing these exercises right away. Do not do them again unless your health care provider says that you can. Get help right away if: You develop sudden, severe hand pain or swelling. If this happens, stop doing these exercises right away. Do not do them again unless your health care provider says that you can. This   information is not intended to replace advice given to you by your health care provider. Make sure you discuss any questions you have with your healthcare provider. Document Revised: 02/13/2019 Document Reviewed: 10/24/2018 [employer] Patient Education  2022 [employer] Cervical Strain and Sprain Rehab Ask your health care provider which exercises are safe for you. Do exercises exactly as told by your health care provider and adjust them as directed. It is normal to feel mild stretching, pulling, tightness, or discomfort as you do these exercises. Stop right away if you feel sudden pain or your pain gets worse. Do not begin these exercises until told by your health care provider. Stretching and range-of-motion exercises Cervical side bending  Using good posture, sit on a stable chair or stand up. Without moving your shoulders, slowly tilt your left / right ear to your shoulder until you feel a stretch in the opposite side neck muscles. You should be looking straight ahead. Hold for __________ seconds. Repeat with the other side of your neck. Repeat __________ times. Complete this exercise __________ times a day. Cervical rotation  Using good posture, sit on a stable chair or stand up. Slowly turn your head to the side as if you are looking over your left / right shoulder. Keep your eyes level with the ground. Stop when you feel a stretch along the side and the back of your neck. Hold for __________ seconds. Repeat this by turning to your other  side. Repeat __________ times. Complete this exercise __________ times a day. Thoracic extension and pectoral stretch Roll a towel or a small blanket so it is about 4 inches (10 cm) in diameter. Lie down on your back on a firm surface. Put the towel lengthwise, under your spine in the middle of your back. It should not be under your shoulder blades. The towel should line up with your spine from your middle back to your lower back. Put your hands behind your head and let your elbows fall out to your sides. Hold for __________ seconds. Repeat __________ times. Complete this exercise __________ times a day. Strengthening exercises Isometric upper cervical flexion Lie on your back with a thin pillow behind your head and a small rolled-up towel under your neck. Gently tuck your chin toward your chest and nod your head down to look toward your feet. Do not lift your head off the pillow. Hold for __________ seconds. Release the tension slowly. Relax your neck muscles completely before you repeat this exercise. Repeat __________ times. Complete this exercise __________ times a day. Isometric cervical extension  Stand about 6 inches (15 cm) away from a wall, with your back facing the wall. Place a soft object, about 6-8 inches (15-20 cm) in diameter, between the back of your head and the wall. A soft object could be a small pillow, a ball, or a folded towel. Gently tilt your head back and press into the soft object. Keep your jaw and forehead relaxed. Hold for __________ seconds. Release the tension slowly. Relax your neck muscles completely before you repeat this exercise. Repeat __________ times. Complete this exercise __________ times a day. Posture and body mechanics Body mechanics refers to the movements and positions of your body while you do your daily activities. Posture is part of body mechanics. Good posture and healthy body mechanics can help to relieve stress in your body's tissues and  joints. Good posture means that your spine is in its natural S-curve position (your spine is neutral), your shoulders are pulled  back slightly, and your head is not tipped forward. The following are general guidelines for applying improved posture andbody mechanics to your everyday activities. Sitting  When sitting, keep your spine neutral and keep your feet flat on the floor. Use a footrest, if necessary, and keep your thighs parallel to the floor. Avoid rounding your shoulders, and avoid tilting your head forward. When working at a desk or a computer, keep your desk at a height where your hands are slightly lower than your elbows. Slide your chair under your desk so you are close enough to maintain good posture. When working at a computer, place your monitor at a height where you are looking straight ahead and you do not have to tilt your head forward or downward to look at the screen.  Standing  When standing, keep your spine neutral and keep your feet about hip-width apart. Keep a slight bend in your knees. Your ears, shoulders, and hips should line up. When you do a task in which you stand in one place for a long time, place one foot up on a stable object that is 2-4 inches (5-10 cm) high, such as a footstool. This helps keep your spine neutral.  Resting When lying down and resting, avoid positions that are most painful for you. Try to support your neck in a neutral position. You can use a contour pillow or asmall rolled-up towel. Your pillow should support your neck but not push on it. This information is not intended to replace advice given to you by your health care provider. Make sure you discuss any questions you have with your healthcare provider. Document Revised: 02/12/2019 Document Reviewed: 07/24/2018 [employer] Patient Education  2022 [employer] Back Exercises The following exercises strengthen the muscles that help to support the trunk and back. They also help to keep the lower  back flexible. Doing these exercises can help to prevent back pain or lessen existing pain. If you have back pain or discomfort, try doing these exercises 2-3 times each day or as told by your health care provider. As your pain improves, do them once each day, but increase the number of times that you repeat the steps for each exercise (do more repetitions). To prevent the recurrence of back pain, continue to do these exercises once each day or as told by your health care provider. Do exercises exactly as told by your health care provider and adjust them as directed. It is normal to feel mild stretching, pulling, tightness, or discomfort as you do these exercises, but you should stop right away if youfeel sudden pain or your pain gets worse. Exercises Single knee to chest Repeat these steps 3-5 times for each leg: Lie on your back on a firm bed or the floor with your legs extended. Bring one knee to your chest. Your other leg should stay extended and in contact with the floor. Hold your knee in place by grabbing your knee or thigh with both hands and hold. Pull on your knee until you feel a gentle stretch in your lower back or buttocks. Hold the stretch for 10-30 seconds. Slowly release and straighten your leg. Pelvic tilt Repeat these steps 5-10 times: Lie on your back on a firm bed or the floor with your legs extended. Bend your knees so they are pointing toward the ceiling and your feet are flat on the floor. Tighten your lower abdominal muscles to press your lower back against the floor. This motion will tilt your pelvis so  your tailbone points up toward the ceiling instead of pointing to your feet or the floor. With gentle tension and even breathing, hold this position for 5-10 seconds. Cat-cow Repeat these steps until your lower back becomes more flexible: Get into a hands-and-knees position on a firm surface. Keep your hands under your shoulders, and keep your knees under your hips. You  may place padding under your knees for comfort. Let your head hang down toward your chest. Contract your abdominal muscles and point your tailbone toward the floor so your lower back becomes rounded like the back of a cat. Hold this position for 5 seconds. Slowly lift your head, let your abdominal muscles relax and point your tailbone up toward the ceiling so your back forms a sagging arch like the back of a cow. Hold this position for 5 seconds.  Press-ups Repeat these steps 5-10 times: Lie on your abdomen (face-down) on the floor. Place your palms near your head, about shoulder-width apart. Keeping your back as relaxed as possible and keeping your hips on the floor, slowly straighten your arms to raise the top half of your body and lift your shoulders. Do not use your back muscles to raise your upper torso. You may adjust the placement of your hands to make yourself more comfortable. Hold this position for 5 seconds while you keep your back relaxed. Slowly return to lying flat on the floor.  Bridges Repeat these steps 10 times: Lie on your back on a firm surface. Bend your knees so they are pointing toward the ceiling and your feet are flat on the floor. Your arms should be flat at your sides, next to your body. Tighten your buttocks muscles and lift your buttocks off the floor until your waist is at almost the same height as your knees. You should feel the muscles working in your buttocks and the back of your thighs. If you do not feel these muscles, slide your feet 1-2 inches farther away from your buttocks. Hold this position for 3-5 seconds. Slowly lower your hips to the starting position, and allow your buttocks muscles to relax completely. If this exercise is too easy, try doing it with your arms crossed over yourchest. Abdominal crunches Repeat these steps 5-10 times: Lie on your back on a firm bed or the floor with your legs extended. Bend your knees so they are pointing toward the  ceiling and your feet are flat on the floor. Cross your arms over your chest. Tip your chin slightly toward your chest without bending your neck. Tighten your abdominal muscles and slowly raise your trunk (torso) high enough to lift your shoulder blades a tiny bit off the floor. Avoid raising your torso higher than that because it can put too much stress on your low back and does not help to strengthen your abdominal muscles. Slowly return to your starting position. Back lifts Repeat these steps 5-10 times: Lie on your abdomen (face-down) with your arms at your sides, and rest your forehead on the floor. Tighten the muscles in your legs and your buttocks. Slowly lift your chest off the floor while you keep your hips pressed to the floor. Keep the back of your head in line with the curve in your back. Your eyes should be looking at the floor. Hold this position for 3-5 seconds. Slowly return to your starting position. Contact a health care provider if: Your back pain or discomfort gets much worse when you do an exercise. Your worsening back pain  or discomfort does not lessen within 2 hours after you exercise. If you have any of these problems, stop doing these exercises right away. Do not do them again unless your health care provider says that you can. Get help right away if: You develop sudden, severe back pain. If this happens, stop doing the exercises right away. Do not do them again unless your health care provider says that you can. This information is not intended to replace advice given to you by your health care provider. Make sure you discuss any questions you have with your healthcare provider. Document Revised: 02/27/2019 Document Reviewed: 07/25/2018 Elsevier Patient Education  2022 [employer].

## 2021-06-23 NOTE — Progress Notes (Signed)
Chief Complaint  Patient presents with   New Patient (Initial Visit)    Room 16. Muscle spasms of right, upper extremity and weakness in neck.      ASSESSMENT AND PLAN  Morgan Molina is a 45 y.o. female  Constellation of complaints,  Passing out episode, neck pain, radiating muscle spasm to bilateral upper extremity  Need to rule out cervical radiculopathy, upper extremity neuropathy  MRI of the brain, cervical spine to rule out structural abnormalities  EMG nerve conduction study   gabapentin 100 mg 3 times a day,  Anxiety Zoloft 50 mg daily by primary care physician   DIAGNOSTIC DATA (LABS, IMAGING, TESTING) - I reviewed patient records, labs, notes, testing and imaging myself where available.  Laboratory evaluations in August 2022 showed normal TSH, IgG, protein electrophoresis, uric acid, ESR, calcium, rheumatoid factor, ANA, A1c was slightly elevated 5.9, CPK was 160,  Multiple x-rays, x-ray of right and left knee mild osteoarthritis, moderate chondromalacia patella  Osteoarthritis of right and left hand  X-ray of lumbar, dextroscoliosis degenerative disc disease, facet joint arthropathy  X-ray of cervical spine, degenerative disc disease  MEDICAL HISTORY:  Morgan Molina is a 45 year old female, seen in request by hand surgeon Dr. Leanora Cover, for evaluation of muscle spasm and weakness of right upper extremity, primary care physician is Dr., Mack Hook, MD, initial evaluation was on June 23, 2021  I reviewed and summarized the referring note. PMHX. HTN Obesity. Right CTS in April 2022, did not help her symptoms PTSD, anxiety.  She had a history of right carpal tunnel release surgery in April 2022, prior to the surgery, she has frequent right hand paresthesia, subjective weakness, also neck pain, the carpal tunnel release surgery did not help her symptoms,  She now complains that neck pain, head feel heavy upper neck, radiating pain to  bilateral shoulder, worse to the left side, felt tension all day long, also complains of clumsiness of bilateral hands, difficulty typing,  She reported multiple symptoms on review of system, including difficulty initiate bowel movement, felt the muscles not working, felt dizzy, sometimes passing out often triggered by extreme heat, bright light,  She reported 1 episode happened at work couple weeks ago, she was sitting at her desk about 11 AM, felt dizzy, the computer screen goes in and out of the focus, she managed to lie on the floor, symptoms gradually passed, but felt exhausted afterwards  She often complains of anxiety, feeling panic, felt heart racing fast, has to take deep breath, relaxation music was helpful,   PHYSICAL EXAM:   Vitals:   06/23/21 0925  BP: 131/80  Pulse: 88  Weight: 297 lb (134.7 kg)  Height: '5\' 3"'  (1.6 m)   Not recorded     Body mass index is 52.61 kg/m.  PHYSICAL EXAMNIATION:  Gen: NAD, conversant, well nourised, well groomed                     Cardiovascular: Regular rate rhythm, no peripheral edema, warm, nontender. Eyes: Conjunctivae clear without exudates or hemorrhage Neck: Supple, no carotid bruits. Pulmonary: Clear to auscultation bilaterally   NEUROLOGICAL EXAM:  MENTAL STATUS: Speech:    Speech is normal; fluent and spontaneous with normal comprehension.  Cognition:     Orientation to time, place and person     Normal recent and remote memory     Normal Attention span and concentration     Normal Language, naming, repeating,spontaneous speech  Fund of knowledge   CRANIAL NERVES: CN II: Visual fields are full to confrontation. Pupils are round equal and briskly reactive to light. CN III, IV, VI: extraocular movement are normal. No ptosis. CN V: Facial sensation is intact to light touch CN VII: Face is symmetric with normal eye closure  CN VIII: Hearing is normal to causal conversation. CN IX, X: Phonation is normal. CN XI:  Head turning and shoulder shrug are intact  MOTOR: There is no pronator drift of out-stretched arms. Muscle bulk and tone are normal. Muscle strength is normal.  REFLEXES: Reflexes are 2+ and symmetric at the biceps, triceps, knees, and ankles. Plantar responses are flexor.  SENSORY: Intact to light touch, pinprick and vibratory sensation are intact in fingers and toes.  COORDINATION: There is no trunk or limb dysmetria noted.  GAIT/STANCE: Need push-up to get up from seated position, steady  REVIEW OF SYSTEMS:  Full 14 system review of systems performed and notable only for as above All other review of systems were negative.   ALLERGIES: Allergies  Allergen Reactions   Latex Dermatitis   Lisinopril Cough   Adhesive [Tape] Rash    HOME MEDICATIONS: Current Outpatient Medications  Medication Sig Dispense Refill   gabapentin (NEURONTIN) 100 MG capsule 1 cap by mouth at bedtime and increase by 100 mg or 1 cap every 3 days until taking 3 caps 3 times daily. 90 capsule 3   IBUPROFEN PO Take by mouth as needed.     irbesartan-hydrochlorothiazide (AVALIDE) 150-12.5 MG tablet Take 1 tablet by mouth daily. 90 tablet 3   levocetirizine (XYZAL) 5 MG tablet Take 5 mg by mouth as needed.     sertraline (ZOLOFT) 50 MG tablet Take 1 tablet (50 mg total) by mouth daily. 30 tablet 3   No current facility-administered medications for this visit.    PAST MEDICAL HISTORY: Past Medical History:  Diagnosis Date   Anxiety    Depression    Hypertension    Muscle spasm    PTSD (post-traumatic stress disorder) 09/28/2019   Syncope     PAST SURGICAL HISTORY: Past Surgical History:  Procedure Laterality Date   BREAST EXCISIONAL BIOPSY Right    CARPAL TUNNEL RELEASE Left 02/20/2020   Procedure: LEFT CARPAL TUNNEL RELEASE;  Surgeon: Leanora Cover, MD;  Location: [facility];  Service: Orthopedics;  Laterality: Left;   CARPAL TUNNEL RELEASE Right 02/28/2021   Procedure: CARPAL  TUNNEL RELEASE;  Surgeon: Leanora Cover, MD;  Location: [facility];  Service: Orthopedics;  Laterality: Right;   CHOLECYSTECTOMY     GANGLION CYST EXCISION Left 02/20/2020   Procedure: LEFT DORSAL GANGLION EXCISION;  Surgeon: Leanora Cover, MD;  Location: [facility];  Service: Orthopedics;  Laterality: Left;   TUBAL LIGATION      FAMILY HISTORY: Family History  Problem Relation Age of Onset   Cancer Mother    Hypertension Mother    Heart attack Mother    Breast cancer Mother 2   CAD Father    Hypertension Father    Diabetes Father     SOCIAL HISTORY: Social History   Socioeconomic History   Marital status: Married    Spouse name: Not on file   Number of children: 5   Years of education: some college   Highest education level: Not on file  Occupational History   Occupation: Customer Service  Tobacco Use   Smoking status: Former    Packs/day: 0.10    Types: Cigarettes  Quit date: 10/26/2019    Years since quitting: 1.6   Smokeless tobacco: Never  Vaping Use   Vaping Use: Never used  Substance and Sexual Activity   Alcohol use: Yes    Comment: occasional   Drug use: No   Sexual activity: Not on file  Other Topics Concern   Not on file  Social History Narrative   Lives at home with family.   Right-handed.   No caffeine.   Social Determinants of Health   Financial Resource Strain: Not on file  Food Insecurity: Not on file  Transportation Needs: Not on file  Physical Activity: Not on file  Stress: Not on file  Social Connections: Not on file  Intimate Partner Violence: Not on file      Marcial Pacas, M.D. Ph.D.  [facility] [address], [place], [place] [address] Ph: [phone] Fax: [phone]  CC:  Leanora Cover, MD [address] [place],  [place] [address]  Mack Hook, MD

## 2021-06-27 NOTE — Telephone Encounter (Signed)
Form additions added, signed and redated.  Theone Murdoch is checking with company to see if can fax these changes.

## 2021-06-28 ENCOUNTER — Telehealth: Payer: Self-pay | Admitting: Neurology

## 2021-06-28 NOTE — Telephone Encounter (Signed)
LVM for pt to call back about scheduling mri  06/28/21 [employer] auth: [id number] (exp. 06/28/21 to 08/26/21)

## 2021-06-28 NOTE — Telephone Encounter (Signed)
Patient returned my call she is scheduled at [facility] for 07/20/21. 

## 2021-06-28 NOTE — Telephone Encounter (Signed)
Dr Delrae Alfred spoke with patient regarding the form

## 2021-07-05 ENCOUNTER — Encounter: Payer: Self-pay | Admitting: Physical Medicine and Rehabilitation

## 2021-07-05 ENCOUNTER — Encounter: Payer: Self-pay | Admitting: Radiology

## 2021-07-07 ENCOUNTER — Ambulatory Visit: Payer: BC Managed Care – PPO | Admitting: Neurology

## 2021-07-08 ENCOUNTER — Encounter: Payer: Self-pay | Admitting: Radiology

## 2021-07-20 ENCOUNTER — Other Ambulatory Visit: Payer: BC Managed Care – PPO

## 2021-08-01 ENCOUNTER — Other Ambulatory Visit: Payer: Self-pay | Admitting: Orthopedic Surgery

## 2021-08-02 ENCOUNTER — Encounter (HOSPITAL_BASED_OUTPATIENT_CLINIC_OR_DEPARTMENT_OTHER): Admission: RE | Payer: Self-pay | Source: Ambulatory Visit

## 2021-08-02 ENCOUNTER — Ambulatory Visit (HOSPITAL_BASED_OUTPATIENT_CLINIC_OR_DEPARTMENT_OTHER)
Admission: RE | Admit: 2021-08-02 | Payer: BC Managed Care – PPO | Source: Ambulatory Visit | Admitting: Orthopedic Surgery

## 2021-08-02 SURGERY — RELEASE, A1 PULLEY, FOR TRIGGER FINGER
Anesthesia: Choice | Laterality: Left

## 2021-08-04 ENCOUNTER — Ambulatory Visit (INDEPENDENT_AMBULATORY_CARE_PROVIDER_SITE_OTHER): Payer: BC Managed Care – PPO | Admitting: Otolaryngology

## 2021-08-04 ENCOUNTER — Encounter: Payer: Self-pay | Admitting: Specialist

## 2021-08-04 ENCOUNTER — Ambulatory Visit (INDEPENDENT_AMBULATORY_CARE_PROVIDER_SITE_OTHER): Payer: BC Managed Care – PPO | Admitting: Specialist

## 2021-08-04 ENCOUNTER — Other Ambulatory Visit: Payer: Self-pay

## 2021-08-04 ENCOUNTER — Ambulatory Visit (INDEPENDENT_AMBULATORY_CARE_PROVIDER_SITE_OTHER): Payer: BC Managed Care – PPO

## 2021-08-04 VITALS — BP 106/73 | HR 87 | Ht 64.0 in | Wt 295.0 lb

## 2021-08-04 DIAGNOSIS — M5442 Lumbago with sciatica, left side: Secondary | ICD-10-CM | POA: Diagnosis not present

## 2021-08-04 DIAGNOSIS — M5441 Lumbago with sciatica, right side: Secondary | ICD-10-CM | POA: Diagnosis not present

## 2021-08-04 DIAGNOSIS — J310 Chronic rhinitis: Secondary | ICD-10-CM | POA: Diagnosis not present

## 2021-08-04 DIAGNOSIS — H6504 Acute serous otitis media, recurrent, right ear: Secondary | ICD-10-CM

## 2021-08-04 DIAGNOSIS — H6981 Other specified disorders of Eustachian tube, right ear: Secondary | ICD-10-CM | POA: Diagnosis not present

## 2021-08-04 NOTE — Progress Notes (Signed)
HPI: Morgan Molina is a 45 y.o. female who presents is referred by Dr. Corliss Skains for evaluation of chronic otitis media.  Patient states that over the past year she has had recurrent ear infections and intermittent blockage of her hearing right ear worse than left ear.  She is doing better presently but has had this frequently over the past year.  She does have history of allergies and takes Nasacort on a as needed basis and Xyzal.  For allergies.  She has had occasional earaches but has never ruptured her TM or had drainage from her ear.  Past Medical History:  Diagnosis Date   Anxiety    Depression    Hypertension    Muscle spasm    PTSD (post-traumatic stress disorder) 09/28/2019   Syncope    Past Surgical History:  Procedure Laterality Date   BREAST EXCISIONAL BIOPSY Right    CARPAL TUNNEL RELEASE Left 02/20/2020   Procedure: LEFT CARPAL TUNNEL RELEASE;  Surgeon: Betha Loa, MD;  Location: [facility];  Service: Orthopedics;  Laterality: Left;   CARPAL TUNNEL RELEASE Right 02/28/2021   Procedure: CARPAL TUNNEL RELEASE;  Surgeon: Betha Loa, MD;  Location: [facility];  Service: Orthopedics;  Laterality: Right;   CHOLECYSTECTOMY     GANGLION CYST EXCISION Left 02/20/2020   Procedure: LEFT DORSAL GANGLION EXCISION;  Surgeon: Betha Loa, MD;  Location: [facility];  Service: Orthopedics;  Laterality: Left;   TUBAL LIGATION     Social History   Socioeconomic History   Marital status: Married    Spouse name: Not on file   Number of children: 5   Years of education: some college   Highest education level: Not on file  Occupational History   Occupation: Customer Service  Tobacco Use   Smoking status: Former    Packs/day: 0.10    Types: Cigarettes    Quit date: 10/26/2019    Years since quitting: 1.7   Smokeless tobacco: Never  Vaping Use   Vaping Use: Never used  Substance and Sexual Activity   Alcohol use: Yes    Comment:  occasional   Drug use: No   Sexual activity: Not on file  Other Topics Concern   Not on file  Social History Narrative   Lives at home with family.   Right-handed.   No caffeine.   Social Determinants of Health   Financial Resource Strain: Not on file  Food Insecurity: Not on file  Transportation Needs: Not on file  Physical Activity: Not on file  Stress: Not on file  Social Connections: Not on file   Family History  Problem Relation Age of Onset   Cancer Mother    Hypertension Mother    Heart attack Mother    Breast cancer Mother 66   CAD Father    Hypertension Father    Diabetes Father    Allergies  Allergen Reactions   Latex Dermatitis   Lisinopril Cough   Adhesive [Tape] Rash   Prior to Admission medications   Medication Sig Start Date End Date Taking? Authorizing Provider  gabapentin (NEURONTIN) 100 MG capsule 1 cap by mouth at bedtime and increase by 100 mg or 1 cap every 3 days until taking 3 caps 3 times daily. 06/16/21   Julieanne Manson, MD  IBUPROFEN PO Take by mouth as needed.    [provider]  irbesartan-hydrochlorothiazide (AVALIDE) 150-12.5 MG tablet Take 1 tablet by mouth daily. 03/30/21   Julieanne Manson, MD  levocetirizine (XYZAL) 5 MG tablet Take 5 mg by mouth as needed.    [provider]  sertraline (ZOLOFT) 50 MG tablet Take 1 tablet (50 mg total) by mouth daily. 02/04/21   Julieanne Manson, MD     Positive ROS: Otherwise negative  All other systems have been reviewed and were otherwise negative with the exception of those mentioned in the HPI and as above.  Physical Exam: Constitutional: Alert, well-appearing, no acute distress Ears: External ears without lesions or tenderness..  Left TM is clear.  Right TM is air-containing but does have a small amount of effusion and is not retracted.  On hearing screening with the 512 1024 tuning fork Weber lateralized to the right however AC was greater than BC bilaterally.  On  testing with a 1024 tuning fork she had a very mild hearing loss in both ears and the hearing subjectively was fairly symmetric. Nasal: External nose without lesions. Septum with minimal deformity and mild rhinitis.  After decongestion nose middle meatus regions were clear bilaterally.  No polyps.  No signs of infection with clear mucus discharge..  Oral: Lips and gums without lesions. Tongue and palate mucosa without lesions. Posterior oropharynx clear. Neck: No palpable adenopathy or masses Respiratory: Breathing comfortably  Skin: No facial/neck lesions or rash noted.  Procedures  Assessment: Chronic rhinitis with eustachian tube dysfunction. Minimal right serous otitis media on exam in the office today  Plan: Reviewed with the patient concerning allergies and eustachian tube dysfunction.  Presently she has only minimal effusion behind the right TM that is otherwise clear.  She has no significant conductive loss and has a mild underlying SNHL. Would recommend regular use of Nasacort 2 sprays each nostril at night as this will help improve eustachian tube function and hopefully reduce ear infections and serous otitis.  Also reviewed with her concerning "popping" her ears. If problems persist could consider placement of myringotomy tubes and briefly reviewed this with her. Also reviewed with her that I will be retiring in 1 month and if she continues to have problems with follow-up with one of the other 2 ENT practices if needed.   Narda Bonds, MD   CC:

## 2021-08-09 ENCOUNTER — Other Ambulatory Visit: Payer: Self-pay | Admitting: Orthopedic Surgery

## 2021-08-11 ENCOUNTER — Ambulatory Visit: Payer: BC Managed Care – PPO | Admitting: Internal Medicine

## 2021-08-17 ENCOUNTER — Encounter: Payer: BC Managed Care – PPO | Admitting: Neurology

## 2021-08-22 ENCOUNTER — Other Ambulatory Visit: Payer: Self-pay

## 2021-08-22 ENCOUNTER — Encounter (HOSPITAL_BASED_OUTPATIENT_CLINIC_OR_DEPARTMENT_OTHER): Payer: Self-pay | Admitting: Orthopedic Surgery

## 2021-08-26 ENCOUNTER — Encounter (HOSPITAL_BASED_OUTPATIENT_CLINIC_OR_DEPARTMENT_OTHER)
Admission: RE | Admit: 2021-08-26 | Discharge: 2021-08-26 | Disposition: A | Discharge status: Home / Self Care | Payer: BC Managed Care – PPO | Source: Ambulatory Visit | Attending: Orthopedic Surgery | Admitting: Orthopedic Surgery

## 2021-08-26 DIAGNOSIS — Z791 Long term (current) use of non-steroidal anti-inflammatories (NSAID): Secondary | ICD-10-CM | POA: Diagnosis not present

## 2021-08-26 DIAGNOSIS — M65312 Trigger thumb, left thumb: Secondary | ICD-10-CM | POA: Diagnosis not present

## 2021-08-26 DIAGNOSIS — Z91040 Latex allergy status: Secondary | ICD-10-CM | POA: Diagnosis not present

## 2021-08-26 DIAGNOSIS — Z87891 Personal history of nicotine dependence: Secondary | ICD-10-CM | POA: Diagnosis not present

## 2021-08-26 DIAGNOSIS — Z888 Allergy status to other drugs, medicaments and biological substances status: Secondary | ICD-10-CM | POA: Diagnosis not present

## 2021-08-26 DIAGNOSIS — M65311 Trigger thumb, right thumb: Secondary | ICD-10-CM | POA: Diagnosis not present

## 2021-08-26 DIAGNOSIS — Z01818 Encounter for other preprocedural examination: Secondary | ICD-10-CM | POA: Insufficient documentation

## 2021-08-26 DIAGNOSIS — Z833 Family history of diabetes mellitus: Secondary | ICD-10-CM | POA: Diagnosis not present

## 2021-08-26 DIAGNOSIS — Z803 Family history of malignant neoplasm of breast: Secondary | ICD-10-CM | POA: Diagnosis not present

## 2021-08-26 DIAGNOSIS — Z79899 Other long term (current) drug therapy: Secondary | ICD-10-CM | POA: Diagnosis not present

## 2021-08-26 DIAGNOSIS — Z91048 Other nonmedicinal substance allergy status: Secondary | ICD-10-CM | POA: Diagnosis not present

## 2021-08-26 DIAGNOSIS — Z8249 Family history of ischemic heart disease and other diseases of the circulatory system: Secondary | ICD-10-CM | POA: Diagnosis not present

## 2021-08-26 LAB — BASIC METABOLIC PANEL
Anion gap: 7 (ref 5–15)
BUN: 9 mg/dL (ref 6–20)
CO2: 25 mmol/L (ref 22–32)
Calcium: 9.3 mg/dL (ref 8.9–10.3)
Chloride: 104 mmol/L (ref 98–111)
Creatinine, Ser: 0.72 mg/dL (ref 0.44–1.00)
GFR, Estimated: >60 mL/min (ref >60)
Glucose, Bld: 95 mg/dL (ref 70–99)
Potassium: 3.7 mmol/L (ref 3.5–5.1)
Sodium: 136 mmol/L (ref 135–145)

## 2021-08-26 NOTE — Progress Notes (Signed)

## 2021-08-29 ENCOUNTER — Ambulatory Visit (HOSPITAL_BASED_OUTPATIENT_CLINIC_OR_DEPARTMENT_OTHER): Payer: BC Managed Care – PPO | Admitting: Certified Registered"

## 2021-08-29 ENCOUNTER — Other Ambulatory Visit: Payer: Self-pay

## 2021-08-29 ENCOUNTER — Ambulatory Visit (HOSPITAL_BASED_OUTPATIENT_CLINIC_OR_DEPARTMENT_OTHER)
Admission: RE | Admit: 2021-08-29 | Discharge: 2021-08-29 | Disposition: A | Discharge status: Home / Self Care | Payer: BC Managed Care – PPO | Attending: Orthopedic Surgery | Admitting: Orthopedic Surgery

## 2021-08-29 ENCOUNTER — Encounter (HOSPITAL_BASED_OUTPATIENT_CLINIC_OR_DEPARTMENT_OTHER)
Admission: RE | Disposition: A | Discharge status: Home / Self Care | Payer: Self-pay | Source: Home / Self Care | Attending: Orthopedic Surgery

## 2021-08-29 ENCOUNTER — Encounter (HOSPITAL_BASED_OUTPATIENT_CLINIC_OR_DEPARTMENT_OTHER): Payer: Self-pay | Admitting: Orthopedic Surgery

## 2021-08-29 DIAGNOSIS — Z8249 Family history of ischemic heart disease and other diseases of the circulatory system: Secondary | ICD-10-CM | POA: Insufficient documentation

## 2021-08-29 DIAGNOSIS — Z91040 Latex allergy status: Secondary | ICD-10-CM | POA: Insufficient documentation

## 2021-08-29 DIAGNOSIS — M65312 Trigger thumb, left thumb: Secondary | ICD-10-CM | POA: Diagnosis not present

## 2021-08-29 DIAGNOSIS — Z01818 Encounter for other preprocedural examination: Secondary | ICD-10-CM | POA: Insufficient documentation

## 2021-08-29 DIAGNOSIS — Z87891 Personal history of nicotine dependence: Secondary | ICD-10-CM | POA: Insufficient documentation

## 2021-08-29 DIAGNOSIS — Z803 Family history of malignant neoplasm of breast: Secondary | ICD-10-CM | POA: Insufficient documentation

## 2021-08-29 DIAGNOSIS — Z79899 Other long term (current) drug therapy: Secondary | ICD-10-CM | POA: Insufficient documentation

## 2021-08-29 DIAGNOSIS — Z888 Allergy status to other drugs, medicaments and biological substances status: Secondary | ICD-10-CM | POA: Insufficient documentation

## 2021-08-29 DIAGNOSIS — Z91048 Other nonmedicinal substance allergy status: Secondary | ICD-10-CM | POA: Insufficient documentation

## 2021-08-29 DIAGNOSIS — M65311 Trigger thumb, right thumb: Secondary | ICD-10-CM | POA: Insufficient documentation

## 2021-08-29 DIAGNOSIS — Z833 Family history of diabetes mellitus: Secondary | ICD-10-CM | POA: Insufficient documentation

## 2021-08-29 DIAGNOSIS — Z791 Long term (current) use of non-steroidal anti-inflammatories (NSAID): Secondary | ICD-10-CM | POA: Insufficient documentation

## 2021-08-29 HISTORY — PX: TRIGGER FINGER RELEASE: SHX641

## 2021-08-29 LAB — POCT PREGNANCY, URINE: Preg Test, Ur: NEGATIVE

## 2021-08-29 SURGERY — RELEASE, A1 PULLEY, FOR TRIGGER FINGER
Anesthesia: General | Site: Thumb | Laterality: Bilateral

## 2021-08-29 MED ORDER — DEXAMETHASONE SODIUM PHOSPHATE 10 MG/ML IJ SOLN
INTRAMUSCULAR | Status: DC | PRN
  Administered 2021-08-29: 5 mg via INTRAVENOUS

## 2021-08-29 MED ORDER — DEXMEDETOMIDINE HCL IN NACL 200 MCG/50ML IV SOLN
INTRAVENOUS | Status: DC | PRN
  Administered 2021-08-29: 12 ug via INTRAVENOUS

## 2021-08-29 MED ORDER — MIDAZOLAM HCL 2 MG/2ML IJ SOLN
INTRAMUSCULAR | Status: AC
  Filled 2021-08-29: qty 2

## 2021-08-29 MED ORDER — CEFAZOLIN SODIUM-DEXTROSE 2-4 GM/100ML-% IV SOLN: 2.0000 g | INTRAVENOUS | Status: AC

## 2021-08-29 MED ORDER — LACTATED RINGERS IV SOLN: INTRAVENOUS | Status: DC

## 2021-08-29 MED ORDER — ACETAMINOPHEN 10 MG/ML IV SOLN: 1000.0000 mg | Freq: Once | INTRAVENOUS | Status: DC | PRN

## 2021-08-29 MED ORDER — FENTANYL CITRATE (PF) 100 MCG/2ML IJ SOLN
25.0000 ug | INTRAMUSCULAR | Status: DC | PRN
  Administered 2021-08-29 (×2): 25 ug via INTRAVENOUS

## 2021-08-29 MED ORDER — FENTANYL CITRATE (PF) 100 MCG/2ML IJ SOLN
INTRAMUSCULAR | Status: AC
  Filled 2021-08-29: qty 2

## 2021-08-29 MED ORDER — CEFAZOLIN SODIUM-DEXTROSE 2-4 GM/100ML-% IV SOLN
2.0000 g | INTRAVENOUS | Status: AC
  Administered 2021-08-29: 3 g via INTRAVENOUS

## 2021-08-29 MED ORDER — CEFAZOLIN IN SODIUM CHLORIDE 3-0.9 GM/100ML-% IV SOLN
INTRAVENOUS | Status: AC
  Filled 2021-08-29: qty 100

## 2021-08-29 MED ORDER — LIDOCAINE 2% (20 MG/ML) 5 ML SYRINGE
INTRAMUSCULAR | Status: DC | PRN
  Administered 2021-08-29: 60 mg via INTRAVENOUS

## 2021-08-29 MED ORDER — FENTANYL CITRATE (PF) 100 MCG/2ML IJ SOLN
INTRAMUSCULAR | Status: DC | PRN
  Administered 2021-08-29: 50 ug via INTRAVENOUS
  Administered 2021-08-29: 100 ug via INTRAVENOUS
  Administered 2021-08-29: 50 ug via INTRAVENOUS

## 2021-08-29 MED ORDER — HYDROCODONE-ACETAMINOPHEN 5-325 MG PO TABS: ORAL_TABLET | ORAL | 0 refills | Status: DC

## 2021-08-29 MED ORDER — PROPOFOL 500 MG/50ML IV EMUL
INTRAVENOUS | Status: AC
  Filled 2021-08-29: qty 50

## 2021-08-29 MED ORDER — PROPOFOL 10 MG/ML IV BOLUS
INTRAVENOUS | Status: DC | PRN
  Administered 2021-08-29: 100 mg via INTRAVENOUS
  Administered 2021-08-29: 200 mg via INTRAVENOUS
  Administered 2021-08-29: 100 mg via INTRAVENOUS

## 2021-08-29 MED ORDER — ONDANSETRON HCL 4 MG/2ML IJ SOLN
INTRAMUSCULAR | Status: DC | PRN
  Administered 2021-08-29: 4 mg via INTRAVENOUS

## 2021-08-29 MED ORDER — MIDAZOLAM HCL 5 MG/5ML IJ SOLN
INTRAMUSCULAR | Status: DC | PRN
  Administered 2021-08-29: 2 mg via INTRAVENOUS

## 2021-08-29 MED ORDER — BUPIVACAINE HCL (PF) 0.25 % IJ SOLN
INTRAMUSCULAR | Status: DC | PRN
  Administered 2021-08-29: 10 mL

## 2021-08-29 MED ORDER — 0.9 % SODIUM CHLORIDE (POUR BTL) OPTIME
TOPICAL | Status: DC | PRN
  Administered 2021-08-29: 200 mL

## 2021-08-29 SURGICAL SUPPLY — 31 items
APL PRP STRL LF DISP 70% ISPRP (MISCELLANEOUS) ×2
BLADE SURG 15 STRL LF DISP TIS (BLADE) ×2 IMPLANT
BLADE SURG 15 STRL SS (BLADE) ×4
BNDG CMPR 9X4 STRL LF SNTH (GAUZE/BANDAGES/DRESSINGS) ×1
BNDG COHESIVE 3X5 TAN ST LF (GAUZE/BANDAGES/DRESSINGS) ×4 IMPLANT
BNDG ESMARK 4X9 LF (GAUZE/BANDAGES/DRESSINGS) ×2 IMPLANT
CHLORAPREP W/TINT 26 (MISCELLANEOUS) ×4 IMPLANT
CORD BIPOLAR FORCEPS 12FT (ELECTRODE) ×2 IMPLANT
COVER BACK TABLE 60X90IN (DRAPES) ×2 IMPLANT
COVER MAYO STAND STRL (DRAPES) ×4 IMPLANT
DRAPE EXTREMITY T 121X128X90 (DISPOSABLE) ×4 IMPLANT
DRAPE U-SHAPE 47X51 STRL (DRAPES) ×2 IMPLANT
GAUZE SPONGE 4X4 12PLY STRL (GAUZE/BANDAGES/DRESSINGS) ×2 IMPLANT
GAUZE XEROFORM 1X8 LF (GAUZE/BANDAGES/DRESSINGS) ×2 IMPLANT
GLOVE SRG 8 PF TXTR STRL LF DI (GLOVE) ×1 IMPLANT
GLOVE SURG POLYISO LF SZ7 (GLOVE) ×2 IMPLANT
GLOVE SURG POLYISO LF SZ7.5 (GLOVE) ×2 IMPLANT
GLOVE SURG UNDER POLY LF SZ7 (GLOVE) ×2 IMPLANT
GLOVE SURG UNDER POLY LF SZ8 (GLOVE) ×2
GOWN STRL REUS W/ TWL LRG LVL3 (GOWN DISPOSABLE) ×1 IMPLANT
GOWN STRL REUS W/TWL LRG LVL3 (GOWN DISPOSABLE) ×2
GOWN STRL REUS W/TWL XL LVL3 (GOWN DISPOSABLE) ×2 IMPLANT
NEEDLE HYPO 25X1 1.5 SAFETY (NEEDLE) ×2 IMPLANT
NS IRRIG 1000ML POUR BTL (IV SOLUTION) ×2 IMPLANT
PACK BASIN DAY SURGERY FS (CUSTOM PROCEDURE TRAY) ×2 IMPLANT
STOCKINETTE 4X48 STRL (DRAPES) ×4 IMPLANT
SUT ETHILON 4 0 PS 2 18 (SUTURE) ×2 IMPLANT
SYR BULB EAR ULCER 3OZ GRN STR (SYRINGE) ×2 IMPLANT
SYR CONTROL 10ML LL (SYRINGE) ×2 IMPLANT
TOWEL GREEN STERILE FF (TOWEL DISPOSABLE) ×4 IMPLANT
UNDERPAD 30X36 HEAVY ABSORB (UNDERPADS AND DIAPERS) ×4 IMPLANT

## 2021-08-29 NOTE — H&P (Signed)
Morgan Molina is an 45 y.o. female.   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  These are bothersome to her.  They have been injected without resolution.  She wishes to have trigger thumb releases.  Allergies:  Allergies  Allergen Reactions   Latex Dermatitis   Lisinopril Cough   Adhesive [Tape] Rash    Past Medical History:  Diagnosis Date   Anxiety    Depression    Hypertension    Muscle spasm    PTSD (post-traumatic stress disorder) 09/28/2019   Syncope     Past Surgical History:  Procedure Laterality Date   BREAST EXCISIONAL BIOPSY Right    CARPAL TUNNEL RELEASE Left 02/20/2020   Procedure: LEFT CARPAL TUNNEL RELEASE;  Surgeon: Betha Loa, MD;  Location: [facility];  Service: Orthopedics;  Laterality: Left;   CARPAL TUNNEL RELEASE Right 02/28/2021   Procedure: CARPAL TUNNEL RELEASE;  Surgeon: Betha Loa, MD;  Location: [facility];  Service: Orthopedics;  Laterality: Right;   CHOLECYSTECTOMY     GANGLION CYST EXCISION Left 02/20/2020   Procedure: LEFT DORSAL GANGLION EXCISION;  Surgeon: Betha Loa, MD;  [facility];  Service: Orthopedics;  Laterality: Left;   TUBAL LIGATION      Family History: Family History  Problem Relation Age of Onset   Cancer Mother    Hypertension Mother    Heart attack Mother    Breast cancer Mother 3   CAD Father    Hypertension Father    Diabetes Father     Social History:   reports that she quit smoking about 22 months ago. Her smoking use included cigarettes. She smoked an average of .1 packs per day. She has never used smokeless tobacco. She reports current alcohol use. She reports that she does not use drugs.  Medications: Medications Prior to Admission  Medication Sig Dispense Refill   gabapentin (NEURONTIN) 100 MG capsule 1 cap by mouth at bedtime and increase by 100 mg or 1 cap every 3 days until taking 3 caps 3 times  daily. 90 capsule 3   IBUPROFEN PO Take by mouth as needed.     irbesartan-hydrochlorothiazide (AVALIDE) 150-12.5 MG tablet Take 1 tablet by mouth daily. 90 tablet 3   levocetirizine (XYZAL) 5 MG tablet Take 5 mg by mouth as needed.     sertraline (ZOLOFT) 50 MG tablet Take 1 tablet (50 mg total) by mouth daily. 30 tablet 3    Results for orders placed or performed during the hospital encounter of 08/29/21 (from the past 48 hour(s))  Pregnancy, urine POC     Status: None   Collection Time: 08/29/21 11:21 AM  Result Value Ref Range   Preg Test, Ur NEGATIVE NEGATIVE    Comment:        THE SENSITIVITY OF THIS METHODOLOGY IS >24 mIU/mL     No results found.    Blood pressure (!) 103/59, pulse 81, temperature 98.4 F (36.9 C), temperature source Oral, resp. rate 18, height 5\' 4"  (1.626 m), weight 135.5 kg, last menstrual period 08/11/2021, SpO2 100 %.  General appearance: alert, cooperative, and appears stated age Head: Normocephalic, without obvious abnormality, atraumatic Neck: supple, symmetrical, trachea midline Cardio: regular rate and rhythm Resp: clear to auscultation bilaterally Extremities: Intact sensation and capillary refill all digits.  +epl/fpl/io.  No wounds.  Pulses: 2+ and symmetric Skin: Skin color, texture, turgor normal. No rashes or lesions Neurologic: Grossly normal Incision/Wound: none  Assessment/Plan Bilateral trigger thumbs.  Non operative and operative treatment options have been discussed with the patient and patient wishes to proceed with operative treatment. Risks, benefits, and alternatives of surgery have been discussed and the patient agrees with the plan of care.   Betha Loa 08/29/2021, 12:30 PM

## 2021-08-29 NOTE — Anesthesia Procedure Notes (Signed)
Procedure Name: LMA Insertion Date/Time: 08/29/2021 1:22 PM Performed by: Montez Morita, Nigel Wessman W, CRNA Pre-anesthesia Checklist: Patient identified, Emergency Drugs available, Suction available and Patient being monitored Patient Re-evaluated:Patient Re-evaluated prior to induction Oxygen Delivery Method: Circle system utilized Preoxygenation: Pre-oxygenation with 100% oxygen Induction Type: IV induction Ventilation: Mask ventilation without difficulty LMA: LMA with gastric port inserted LMA Size: 4.0 Number of attempts: 1 Airway Equipment and Method: Bite block Placement Confirmation: positive ETCO2 and breath sounds checked- equal and bilateral Tube secured with: Tape Dental Injury: Teeth and Oropharynx as per pre-operative assessment

## 2021-08-29 NOTE — Transfer of Care (Signed)
Immediate Anesthesia Transfer of Care Note  Patient: Morgan Molina  Procedure(s) Performed: RELEASE TRIGGER FINGER/A-1 PULLEY BILATERAL THUMBS (Bilateral: Thumb)  Patient Location: PACU  Anesthesia Type:General  Level of Consciousness: drowsy  Airway & Oxygen Therapy: Patient Spontanous Breathing and Patient connected to face mask oxygen  Post-op Assessment: Report given to RN and Post -op Vital signs reviewed and stable  Post vital signs: Reviewed and stable  Last Vitals:  Vitals Value Taken Time  BP 142/81 08/29/21 1357  Temp    Pulse 72 08/29/21 1359  Resp 16 08/29/21 1359  SpO2 100 % 08/29/21 1359  Vitals shown include unvalidated device data.  Last Pain:  Vitals:   08/29/21 1144  TempSrc: Oral  PainSc: 6       Patients Stated Pain Goal: 6 (08/29/21 1144)  Complications: No notable events documented.

## 2021-08-29 NOTE — Discharge Instructions (Addendum)

## 2021-08-29 NOTE — Op Note (Signed)
08/29/2021 [facility] OPERATIVE REPORT   PREOPERATIVE DIAGNOSIS: Bilateral trigger thumb.  POSTOPERATIVE DIAGNOSIS:  Bilateral trigger thumb.  PROCEDURE: Bilateral trigger thumb release.  SURGEON:  Betha Loa, MD  ASSISTANT:  None.  ANESTHESIA:  General.  IV FLUIDS:  Per anesthesia flow sheet.  ESTIMATED BLOOD LOSS:  Minimal.  COMPLICATIONS:  None.  SPECIMENS:  None.  TOURNIQUET TIME:  Total Tourniquet Time Documented: Forearm (Right) - 9 minutes Total: Forearm (Right) - 9 minutes  Upper Arm (Left) - 9 minutes Total: Upper Arm (Left) - 9 minutes   DISPOSITION:  Stable to PACU.  LOCATION: [facility]  INDICATIONS: JERICHO CIESLIK is a 45 y.o. female with triggering bilateral thumbs.  These have been injected without lasting resolution.  She wishes to have bilateral trigger thumb release.  Risks, benefits and alternatives of surgery were discussed including the risk of blood loss, infection, damage to nerves, vessels, tendons, ligaments, bone, failure of surgery, need for additional surgery, complications with wound healing, continued pain, continued triggering and need for repeat surgery.  She voiced understanding of these risks and elected to proceed.  OPERATIVE COURSE:  After being identified preoperatively by myself, the patient and I agreed upon the procedure and site of procedure.  The surgical site was marked. Surgical consent had been signed. She was given IV Ancef as preoperative antibiotic prophylaxis. She was transported to the operating room and placed on the operating room table in supine position with the Bilateral upper extremity on an arm board. General anesthesia was induced by the anesthesiologist.  The left upper extremity was prepped and draped in normal sterile orthopedic fashion. A surgical pause was performed between surgeons, anesthesia, and operating room staff, and all were in agreement as to the patient, procedure, and  site of procedure.  Tourniquet at the proximal aspect of the left upper extremity was inflated to 250 mmHg after exsanguination of the arm with an Esmarch bandage.  An incision was made transversely at the MP flexion crease of the thumb.  This was made through the skin only.  Spreading technique was used.  The radial and ulnar digital nerves were identified and were protected throughout the case. The flexor sheath was identified.  The A1 pulley was identified.  It was sharply incised.  It was released in its entirety.  Care was taken to avoid any release of the oblique pulley. The thumb was placed through a range of motion, there was noted to be no catching.  The wound was copiously irrigated with sterile saline. It was then closed with 4-0 nylon in a horizontal mattress fashion.  The wound was injected with  0.25% plain Marcaine to aid in postoperative analgesia.  It was then dressed with sterile Xeroform, 4x4s, and wrapped lightly with a Coban dressing.  Tourniquet was deflated at 9 minutes.  The fingertips were pink with brisk capillary refill after deflation of the tourniquet.   The right upper extremity was prepped and draped in normal sterile orthopaedic fashion. Tourniquet at the proximal aspect of the forearm was inflated to 250 mmHg after exsanguination of the arm with an Esmarch bandage. An incision was made transversely at the MP flexion crease of the thumb.  This was made through the skin only.  Spreading technique was used.  The radial and ulnar digital nerves were identified and were protected throughout the case. The flexor sheath was identified.  The A1 pulley was identified.  It was sharply incised.  It was released in  its entirety.  Care was taken to avoid any release of the oblique pulley. The thumb was placed through a range of motion, there was noted to be no catching.  The wound was copiously irrigated with sterile saline. It was then closed with 4-0 nylon in a horizontal mattress fashion.  The  wound was injected with  0.25% plain Marcaine to aid in postoperative analgesia.  It was then dressed with sterile Xeroform, 4x4s, and wrapped lightly with a Coban dressing.  Tourniquet was deflated at 9 minutes.  The fingertips were pink with brisk capillary refill after deflation of the tourniquet.The operative drapes were broken down and the patient was awoken from anesthesia safely.  She was transferred back to the stretcher and taken to the PACU in stable condition.   I will see her back in the office in 1 week for postoperative followup.  I will give her a prescription for Norco 5/325 1-2 tabs PO q6 hours prn pain, dispense # 20.    Betha Loa, MD Electronically signed, 08/29/21

## 2021-08-29 NOTE — Anesthesia Preprocedure Evaluation (Addendum)
Anesthesia Evaluation  Patient identified by MRN, date of birth, ID band Patient awake    Reviewed: Allergy & Precautions, NPO status , Patient's Chart, lab work & pertinent test results  Airway Mallampati: II  TM Distance: >3 FB Neck ROM: Full    Dental  (+) Teeth Intact   Pulmonary neg pulmonary ROS, former smoker,    Pulmonary exam normal        Cardiovascular hypertension, Pt. on medications  Rhythm:Regular Rate:Normal     Neuro/Psych Anxiety Depression    GI/Hepatic negative GI ROS, Neg liver ROS,   Endo/Other  negative endocrine ROS  Renal/GU negative Renal ROS  negative genitourinary   Musculoskeletal Bilateral trigger fingers   Abdominal Normal abdominal exam  (+)   Peds  Hematology negative hematology ROS (+)   Anesthesia Other Findings   Reproductive/Obstetrics                            Anesthesia Physical Anesthesia Plan  ASA: 2  Anesthesia Plan: General   Post-op Pain Management:    Induction: Intravenous  PONV Risk Score and Plan: 3 and Dexamethasone, Ondansetron, Midazolam and Treatment may vary due to age or medical condition  Airway Management Planned: Mask and LMA  Additional Equipment: None  Intra-op Plan:   Post-operative Plan: Extubation in OR  Informed Consent: I have reviewed the patients History and Physical, chart, labs and discussed the procedure including the risks, benefits and alternatives for the proposed anesthesia with the patient or authorized representative who has indicated his/her understanding and acceptance.     Dental advisory given  Plan Discussed with: CRNA  Anesthesia Plan Comments:        Anesthesia Quick Evaluation

## 2021-08-29 NOTE — Anesthesia Procedure Notes (Signed)
Procedure Name: LMA Insertion Date/Time: 08/29/2021 1:20 PM Performed by: Montez Morita, Lalena Salas W, CRNA Pre-anesthesia Checklist: Patient identified, Emergency Drugs available, Suction available and Patient being monitored Patient Re-evaluated:Patient Re-evaluated prior to induction Oxygen Delivery Method: Circle system utilized Preoxygenation: Pre-oxygenation with 100% oxygen Induction Type: IV induction Ventilation: Mask ventilation without difficulty LMA: LMA inserted LMA Size: 4.0 Number of attempts: 1 Airway Equipment and Method: Bite block Placement Confirmation: positive ETCO2 and breath sounds checked- equal and bilateral Tube secured with: Tape Dental Injury: Teeth and Oropharynx as per pre-operative assessment

## 2021-08-30 ENCOUNTER — Encounter (HOSPITAL_BASED_OUTPATIENT_CLINIC_OR_DEPARTMENT_OTHER): Payer: Self-pay | Admitting: Orthopedic Surgery

## 2021-08-30 NOTE — Anesthesia Postprocedure Evaluation (Signed)
Anesthesia Post Note  Patient: Morgan Molina  Procedure(s) Performed: RELEASE TRIGGER FINGER/A-1 PULLEY BILATERAL THUMBS (Bilateral: Thumb)     Patient location during evaluation: PACU Anesthesia Type: General Level of consciousness: awake and alert Pain management: pain level controlled Vital Signs Assessment: post-procedure vital signs reviewed and stable Respiratory status: spontaneous breathing, nonlabored ventilation and respiratory function stable Cardiovascular status: blood pressure returned to baseline and stable Postop Assessment: no apparent nausea or vomiting Anesthetic complications: no   No notable events documented.  Last Vitals:  Vitals:   08/29/21 1445 08/29/21 1500  BP: (!) 147/80 (!) 147/85  Pulse: 73 75  Resp: (!) 22 16  Temp:  (!) 36.4 C  SpO2: 97% 100%    Last Pain:  Vitals:   08/29/21 1500  TempSrc:   PainSc: 5                  Lowella Curb

## 2021-09-02 ENCOUNTER — Ambulatory Visit: Payer: BC Managed Care – PPO | Admitting: Orthopaedic Surgery

## 2021-09-06 ENCOUNTER — Ambulatory Visit (INDEPENDENT_AMBULATORY_CARE_PROVIDER_SITE_OTHER): Payer: BC Managed Care – PPO | Admitting: Orthopaedic Surgery

## 2021-09-06 ENCOUNTER — Encounter: Payer: Self-pay | Admitting: Orthopaedic Surgery

## 2021-09-06 ENCOUNTER — Other Ambulatory Visit: Payer: Self-pay

## 2021-09-06 VITALS — BP 144/81 | Ht 64.0 in | Wt 291.0 lb

## 2021-09-06 DIAGNOSIS — G8929 Other chronic pain: Secondary | ICD-10-CM

## 2021-09-06 DIAGNOSIS — M545 Low back pain, unspecified: Secondary | ICD-10-CM | POA: Diagnosis not present

## 2021-09-06 NOTE — Progress Notes (Signed)
Office Visit Note   Patient: Morgan Molina           Date of Birth: Dec 01, 1975           MRN: [id number] Visit Date: 09/06/2021              Requested by: Pollyann Savoy, MD [address] [place],  [place] [address] PCP: Julieanne Manson, MD   Assessment & Plan: Visit Diagnoses:  1. Chronic bilateral low back pain, unspecified whether sciatica present     Plan: Patient with neurogenic claudication symptoms failed conservative treatment anterolisthesis L4-5 and endplate posterior spurring at L2-3 and L3-4.  She has pain that radiates down her legs and claudication symptoms with standing and walking.  She leans on a grocery cart when she goes the store.  We will set her up for an MRI scan for evaluation rule out stenosis at the L4-5 level.  She may have some narrowing at L2-3 and L3-4 based on plain radiograph posterior spurring.  Office follow-up after MRI.  Follow-Up Instructions: No follow-ups on file.   Orders:  Orders Placed This Encounter  Procedures   MR Lumbar Spine w/o contrast   No orders of the defined types were placed in this encounter.     Procedures: No procedures performed   Clinical Data: No additional findings.   Subjective: Chief Complaint  Patient presents with   Lower Back - Pain    HPI 45 year old female was in the office to see Dr. Otelia Sergeant and had lumbar x-rays which showed 3 mm listhesis that change with flexion extension L4-5 and she states she waited many hours but had to get back to work but never actually saw him.  She has had carpal tunnel releases both hands which seem to help her symptoms and just recently had trigger thumb release right and left and has Band-Aids on them.  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  She states she has problems with standing and cannot walk very far due to increased pain.  She gets relief with sitting but states she also has pain  when she sits.  She states she has low back pain knee when she is in supine position and has to position her self with multiple pillows.  She is used ibuprofen, Tiger balm, Tylenol without relief.  She cannot stand longer she states she has spasms in her leg that required her to sit back down.  No associated bowel or bladder symptoms.  Review of Systems negative for Systems previous ganglion cyst of the wrist.  Carpal tunnel left wrist.  All other systems noncontributory HPI.   Objective: Vital Signs: BP (!) 144/81   Ht 5\' 4"  (1.626 m)   Wt 291 lb (132 kg)   LMP 08/11/2021 (Exact Date) Comment: poct -negative today  BMI 49.95 kg/m   Physical Exam Constitutional:      Appearance: She is well-developed.  HENT:     Head: Normocephalic.     Right Ear: External ear normal.     Left Ear: External ear normal. There is no impacted cerumen.  Eyes:     Pupils: Pupils are equal, round, and reactive to light.  Neck:     Thyroid: No thyromegaly.     Trachea: No tracheal deviation.  Cardiovascular:     Rate and Rhythm: Normal rate.  Pulmonary:     Effort: Pulmonary effort is normal.  Abdominal:  Palpations: Abdomen is soft.  Musculoskeletal:     Cervical back: No rigidity.  Skin:    General: Skin is warm and dry.  Neurological:     Mental Status: She is alert and oriented to person, place, and time.  Psychiatric:        Behavior: Behavior normal.    Ortho Exam patient has some pain with straight leg raising right left thumb sciatic notch tenderness.  Tenderness lumbosacral junction.  Minimal trochanteric bursal tenderness negative logroll hips.  Knees reach full extension.  Specialty Comments:  No specialty comments available.  Imaging: Previous lumbar lateral flexion-extension images showed posterior endplate spurring at L2-3 L3-4.  L4-5 showed 2 to 3 mm anterolisthesis with degenerative facet changes.   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   Arthralgia 03/30/2021   Hyperglycemia 03/30/2021   Shortness of breath 03/30/2021   Bilateral chronic serous otitis media 08/15/2020   Eustachian tube dysfunction, bilateral 08/15/2020   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   Ganglion cyst of dorsum of left wrist 10/01/2019   Decreased visual acuity 10/01/2019   PTSD (post-traumatic stress disorder) 09/28/2019   Nipple discharge 04/02/2019   Panic attacks 02/19/2019   Bilateral low back pain 02/19/2019   Seasonal allergies 02/19/2019   Anxiety and depression 01/14/2019   Syncope 01/14/2019   Breast mass, right 01/14/2019   Hypertension    Past Medical History:  Diagnosis Date   Anxiety    Depression    Hypertension    Muscle spasm    PTSD (post-traumatic stress disorder) 09/28/2019   Syncope     Family History  Problem Relation Age of Onset   Cancer Mother    Hypertension Mother    Heart attack Mother    Breast cancer Mother 46   CAD Father    Hypertension Father    Diabetes Father     Past Surgical History:  Procedure Laterality Date   BREAST EXCISIONAL BIOPSY Right    CARPAL TUNNEL RELEASE Left 02/20/2020   Procedure: LEFT CARPAL TUNNEL RELEASE;  Surgeon: Betha Loa, MD;  Location: [facility];  Service: Orthopedics;  Laterality: Left;   CARPAL TUNNEL RELEASE Right 02/28/2021   Procedure: CARPAL TUNNEL RELEASE;  Surgeon: Betha Loa, MD;  Location: [facility];  Service: Orthopedics;  Laterality: Right;   CHOLECYSTECTOMY     GANGLION CYST EXCISION Left 02/20/2020   Procedure: LEFT DORSAL GANGLION EXCISION;  Surgeon: Betha Loa, MD;  Location: [facility];  Service: Orthopedics;  Laterality: Left;   TRIGGER FINGER RELEASE Bilateral 08/29/2021   Procedure: RELEASE TRIGGER FINGER/A-1 PULLEY BILATERAL THUMBS;  Surgeon: Betha Loa, MD;  Location: [facility];  Service: Orthopedics;  Laterality: Bilateral;  30 MIN   TUBAL LIGATION     Social History   Occupational History   Occupation: Clinical biochemist  Tobacco Use   Smoking status: Former    Packs/day: 0.10    Types: Cigarettes    Quit date: 10/26/2019    Years since quitting: 1.8   Smokeless tobacco: Never  Vaping Use   Vaping Use: Never used  Substance and Sexual Activity   Alcohol use: Yes    Comment: occasional   Drug use: No   Sexual activity: Not on file

## 2021-09-12 ENCOUNTER — Other Ambulatory Visit: Payer: Self-pay | Admitting: Internal Medicine

## 2021-09-12 DIAGNOSIS — N6320 Unspecified lump in the left breast, unspecified quadrant: Secondary | ICD-10-CM

## 2021-09-12 DIAGNOSIS — R590 Localized enlarged lymph nodes: Secondary | ICD-10-CM

## 2021-09-13 ENCOUNTER — Ambulatory Visit (INDEPENDENT_AMBULATORY_CARE_PROVIDER_SITE_OTHER): Payer: BC Managed Care – PPO | Admitting: Primary Care

## 2021-09-13 ENCOUNTER — Encounter (INDEPENDENT_AMBULATORY_CARE_PROVIDER_SITE_OTHER): Payer: Self-pay | Admitting: Primary Care

## 2021-09-13 ENCOUNTER — Other Ambulatory Visit: Payer: Self-pay

## 2021-09-13 DIAGNOSIS — Z6841 Body Mass Index (BMI) 40.0 and over, adult: Secondary | ICD-10-CM

## 2021-09-13 DIAGNOSIS — R7303 Prediabetes: Secondary | ICD-10-CM | POA: Diagnosis not present

## 2021-09-13 DIAGNOSIS — Z7689 Persons encountering health services in other specified circumstances: Secondary | ICD-10-CM | POA: Diagnosis not present

## 2021-09-13 NOTE — Patient Instructions (Signed)
Prediabetes Eating Plan °Prediabetes is a condition that causes blood sugar (glucose) levels to be higher than normal. This increases the risk for developing type 2 diabetes (type 2 diabetes mellitus). Working with a health care provider or nutrition specialist (dietitian) to make diet and lifestyle changes can help prevent the onset of diabetes. These changes may help you: °Control your blood glucose levels. °Improve your cholesterol levels. °Manage your blood pressure. °What are tips for following this plan? °Reading food labels °Read food labels to check the amount of fat, salt (sodium), and sugar in prepackaged foods. Avoid foods that have: °Saturated fats. °Trans fats. °Added sugars. °Avoid foods that have more than 300 milligrams (mg) of sodium per serving. Limit your sodium intake to less than 2,300 mg each day. °Shopping °Avoid buying pre-made and processed foods. °Avoid buying drinks with added sugar. °Cooking °Cook with olive oil. Do not use butter, lard, or ghee. °Bake, broil, grill, steam, or boil foods. Avoid frying. °Meal planning ° °Work with your dietitian to create an eating plan that is right for you. This may include tracking how many calories you take in each day. Use a food diary, notebook, or mobile application to track what you eat at each meal. °Consider following a Mediterranean diet. This includes: °Eating several servings of fresh fruits and vegetables each day. °Eating fish at least twice a week. °Eating one serving each day of whole grains, beans, nuts, and seeds. °Using olive oil instead of other fats. °Limiting alcohol. °Limiting red meat. °Using nonfat or low-fat dairy products. °Consider following a plant-based diet. This includes dietary choices that focus on eating mostly vegetables and fruit, grains, beans, nuts, and seeds. °If you have high blood pressure, you may need to limit your sodium intake or follow a diet such as the DASH (Dietary Approaches to Stop Hypertension) eating  plan. The DASH diet aims to lower high blood pressure. °Lifestyle °Set weight loss goals with help from your health care team. It is recommended that most people with prediabetes lose 7% of their body weight. °Exercise for at least 30 minutes 5 or more days a week. °Attend a support group or seek support from a mental health counselor. °Take over-the-counter and prescription medicines only as told by your health care provider. °What foods are recommended? °Fruits °Berries. Bananas. Apples. Oranges. Grapes. Papaya. Mango. Pomegranate. Kiwi. Grapefruit. Cherries. °Vegetables °Lettuce. Spinach. Peas. Beets. Cauliflower. Cabbage. Broccoli. Carrots. Tomatoes. Squash. Eggplant. Herbs. Peppers. Onions. Cucumbers. Brussels sprouts. °Grains °Whole grains, such as whole-wheat or whole-grain breads, crackers, cereals, and pasta. Unsweetened oatmeal. Bulgur. Barley. Quinoa. Brown rice. Corn or whole-wheat flour tortillas or taco shells. °Meats and other proteins °Seafood. Poultry without skin. Lean cuts of pork and beef. Tofu. Eggs. Nuts. Beans. °Dairy °Low-fat or fat-free dairy products, such as yogurt, cottage cheese, and cheese. °Beverages °Water. Tea. Coffee. Sugar-free or diet soda. Seltzer water. Low-fat or nonfat milk. Milk alternatives, such as soy or almond milk. °Fats and oils °Olive oil. Canola oil. Sunflower oil. Grapeseed oil. Avocado. Walnuts. °Sweets and desserts °Sugar-free or low-fat pudding. Sugar-free or low-fat ice cream and other frozen treats. °Seasonings and condiments °Herbs. Sodium-free spices. Mustard. Relish. Low-salt, low-sugar ketchup. Low-salt, low-sugar barbecue sauce. Low-fat or fat-free mayonnaise. °The items listed above may not be a complete list of recommended foods and beverages. Contact a dietitian for more information. °What foods are not recommended? °Fruits °Fruits canned with syrup. °Vegetables °Canned vegetables. Frozen vegetables with butter or cream sauce. °Grains °Refined white  flour and flour   products, such as bread, pasta, snack foods, and cereals. °Meats and other proteins °Fatty cuts of meat. Poultry with skin. Breaded or fried meat. Processed meats. °Dairy °Full-fat yogurt, cheese, or milk. °Beverages °Sweetened drinks, such as iced tea and soda. °Fats and oils °Butter. Lard. Ghee. °Sweets and desserts °Baked goods, such as cake, cupcakes, pastries, cookies, and cheesecake. °Seasonings and condiments °Spice mixes with added salt. Ketchup. Barbecue sauce. Mayonnaise. °The items listed above may not be a complete list of foods and beverages that are not recommended. Contact a dietitian for more information. °Where to find more information °American Diabetes Association: www.diabetes.org °Summary °You may need to make diet and lifestyle changes to help prevent the onset of diabetes. These changes can help you control blood sugar, improve cholesterol levels, and manage blood pressure. °Set weight loss goals with help from your health care team. It is recommended that most people with prediabetes lose 7% of their body weight. °Consider following a Mediterranean diet. This includes eating plenty of fresh fruits and vegetables, whole grains, beans, nuts, seeds, fish, and low-fat dairy, and using olive oil instead of other fats. °This information is not intended to replace advice given to you by your health care provider. Make sure you discuss any questions you have with your health care provider. °Document Revised: 01/22/2020 Document Reviewed: 01/22/2020 °Elsevier Patient Education © 2022 Elsevier Inc. ° °

## 2021-09-13 NOTE — Progress Notes (Signed)
  [facility]   Subjective:   Morgan Molina is a 45 y.o. female presents for hospital follow up and establish care. Admit date to the hospital was 08/29/21, patient was discharged from the hospital on 08/29/21, patient was admitted for: elective surgery RELEASE TRIGGER FINGER/A-1 PULLEY BILATERAL THUMBS. She voices several problems and feels no one is listing she has follow up appt with- orthopedics, neurology, until she saw Dr, Darcella Cheshire.   Past Medical History:  Diagnosis Date   Anxiety    Depression    Hypertension    Muscle spasm    PTSD (post-traumatic stress disorder) 09/28/2019   Syncope      Allergies  Allergen Reactions   Latex Dermatitis   Lisinopril Cough   Adhesive [Tape] Rash   Current Outpatient Medications on File Prior to Visit  Medication Sig Dispense Refill   IBUPROFEN PO Take by mouth as needed.     irbesartan-hydrochlorothiazide (AVALIDE) 150-12.5 MG tablet Take 1 tablet by mouth daily. (Patient not taking: Reported on 09/13/2021) 90 tablet 3   sertraline (ZOLOFT) 50 MG tablet Take 1 tablet (50 mg total) by mouth daily. (Patient not taking: Reported on 09/13/2021) 30 tablet 3   No current facility-administered medications on file prior to visit.     Review of System: ROS  Objective:  BP (!) 168/110 (BP Location: Left Arm, Patient Position: Sitting, Cuff Size: Large)   Pulse 83   Temp (!) 93.4 F (34.1 C) (Temporal)   Ht 5\' 4"  (1.626 m)   Wt (!) 306 lb 6.4 oz (139 kg)   SpO2 100%   BMI 52.59 kg/m   Filed Weights   09/13/21 0844  Weight: (!) 306 lb 6.4 oz (139 kg)    Physical Exam: General Appearance: Well nourished, in no apparent distress. Eyes: PERRLA, EOMs, conjunctiva no swelling or erythema Sinuses: No Frontal/maxillary tenderness ENT/Mouth: Ext aud canals clear, TMs without erythema, bulging.  Hearing normal.  Neck: Supple, thyroid normal.  Respiratory: Respiratory effort normal, BS equal bilaterally without rales,  rhonchi, wheezing or stridor.  Cardio: RRR with no MRGs. Brisk peripheral pulses without edema.  Abdomen: Soft, + BS.  Non tender, no guarding, rebound, hernias, masses. Lymphatics: Non tender without lymphadenopathy.  Musculoskeletal: Full ROM, 5/5 strength, normal gait.  Skin: Warm, dry without rashes, lesions, ecchymosis.  Neuro: Cranial nerves intact. Normal muscle tone, no cerebellar symptoms. Sensation intact.  Psych: Awake and oriented X 3, normal affect, Insight and Judgment appropriate.    Assessment:  Morgan Molina was seen today for new patient (initial visit).  Diagnoses and all orders for this visit:  Morbidly obese (HCC) Morbid Obesity is BMI > 40  indicating an excess in caloric intake or underlining conditions. This may lead to other co-morbidities. Lifestyle modifications of diet and exercise may reduce obesity.    Encounter to establish care Establish care with PCP  Prediabetes  A1c 3 months ago 5.9 . Tildon HuskyPre 5.7- 6.4. per ADA  guidelines . Monitor foods that are high in carbohydrates are the following rice, potatoes, breads, sugars, and pastas.  Reduction in the intake (eating) will assist in lowering your blood sugars.   This note has been created with Marland Kitchen. Any transcriptional errors are unintentional.   Education officer, environmental, NP 09/13/2021, 8:50 AM

## 2021-09-13 NOTE — Progress Notes (Signed)
Discuss why she is not taking medications  Feels better when she does not take them  Bp is in normal range when she checks at home  Has experienced some syncope on medications

## 2021-09-14 ENCOUNTER — Telehealth: Payer: Self-pay | Admitting: Orthopaedic Surgery

## 2021-09-14 ENCOUNTER — Encounter: Payer: BC Managed Care – PPO | Admitting: Physical Medicine and Rehabilitation

## 2021-09-14 NOTE — Telephone Encounter (Signed)
Called patient left message on voicemail to return call to schedule MRI review with Dr Ophelia Charter       MRI scheduled for 09/27/2021

## 2021-09-23 ENCOUNTER — Other Ambulatory Visit: Payer: Self-pay | Admitting: Primary Care

## 2021-09-23 DIAGNOSIS — R590 Localized enlarged lymph nodes: Secondary | ICD-10-CM

## 2021-09-23 DIAGNOSIS — N6320 Unspecified lump in the left breast, unspecified quadrant: Secondary | ICD-10-CM

## 2021-09-26 ENCOUNTER — Other Ambulatory Visit: Payer: Self-pay

## 2021-09-26 ENCOUNTER — Ambulatory Visit
Admission: RE | Admit: 2021-09-26 | Discharge: 2021-09-26 | Disposition: A | Discharge status: Home / Self Care | Payer: BC Managed Care – PPO | Source: Ambulatory Visit | Attending: Internal Medicine | Admitting: Internal Medicine

## 2021-09-26 DIAGNOSIS — N6320 Unspecified lump in the left breast, unspecified quadrant: Secondary | ICD-10-CM

## 2021-09-26 DIAGNOSIS — R590 Localized enlarged lymph nodes: Secondary | ICD-10-CM

## 2021-10-01 ENCOUNTER — Other Ambulatory Visit: Payer: BC Managed Care – PPO

## 2021-10-02 ENCOUNTER — Other Ambulatory Visit: Payer: Self-pay

## 2021-10-02 ENCOUNTER — Ambulatory Visit
Admission: RE | Admit: 2021-10-02 | Discharge: 2021-10-02 | Disposition: A | Discharge status: Home / Self Care | Payer: BC Managed Care – PPO | Source: Ambulatory Visit | Attending: Orthopaedic Surgery | Admitting: Orthopaedic Surgery

## 2021-10-02 DIAGNOSIS — M545 Low back pain, unspecified: Secondary | ICD-10-CM

## 2021-10-05 ENCOUNTER — Encounter: Payer: BC Managed Care – PPO | Admitting: Neurology

## 2021-12-09 ENCOUNTER — Encounter
Payer: BC Managed Care – PPO | Attending: Physical Medicine and Rehabilitation | Admitting: Physical Medicine and Rehabilitation

## 2021-12-12 NOTE — Progress Notes (Deleted)
Office Visit Note  Patient: Morgan Molina             Date of Birth: Jun 21, 1976           MRN: [id number]             PCP: Kerin Perna, NP Referring: Mack Hook, MD Visit Date: 12/26/2021 Occupation: _0 @  Subjective:    History of Present Illness: Morgan Molina is a 46 y.o. female with a history of osteoarthritis and DDD.  Patient was referred to physical therapy at her last office visit in August 2022 by Dr. Estanislado Pandy.  Activities of Daily Living:  Patient reports morning stiffness for *** {minute/hour:19697}.   Patient {ACTIONS;DENIES/REPORTS:21021675::"Denies"} nocturnal pain.  Difficulty dressing/grooming: {ACTIONS;DENIES/REPORTS:21021675::"Denies"} Difficulty climbing stairs: {ACTIONS;DENIES/REPORTS:21021675::"Denies"} Difficulty getting out of chair: {ACTIONS;DENIES/REPORTS:21021675::"Denies"} Difficulty using hands for taps, buttons, cutlery, and/or writing: {ACTIONS;DENIES/REPORTS:21021675::"Denies"}  No Rheumatology ROS completed.   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   Arthralgia 03/30/2021   Hyperglycemia 03/30/2021   Shortness of breath 03/30/2021   Bilateral chronic serous otitis media 08/15/2020   Eustachian tube dysfunction, bilateral 08/15/2020   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   Ganglion cyst of dorsum of left wrist 10/01/2019   Decreased visual acuity 10/01/2019   PTSD (post-traumatic stress disorder) 09/28/2019   Nipple discharge 04/02/2019   Panic attacks 02/19/2019   Bilateral low back pain 02/19/2019   Seasonal allergies 02/19/2019   Anxiety and depression 01/14/2019   Syncope 01/14/2019   Breast mass, right 01/14/2019   Hypertension     Past Medical History:  Diagnosis Date   Anxiety    Depression    Hypertension    Muscle  spasm    PTSD (post-traumatic stress disorder) 09/28/2019   Syncope     Family History  Problem Relation Age of Onset   Cancer Mother    Hypertension Mother    Heart attack Mother    Breast cancer Mother 13   CAD Father    Hypertension Father    Diabetes Father    Past Surgical History:  Procedure Laterality Date   BREAST EXCISIONAL BIOPSY Right    CARPAL TUNNEL RELEASE Left 02/20/2020   Procedure: LEFT CARPAL TUNNEL RELEASE;  Surgeon: Leanora Cover, MD;  Location: [facility];  Service: Orthopedics;  Laterality: Left;   CARPAL TUNNEL RELEASE Right 02/28/2021   Procedure: CARPAL TUNNEL RELEASE;  Surgeon: Leanora Cover, MD;  Location: [facility];  Service: Orthopedics;  Laterality: Right;   CHOLECYSTECTOMY     GANGLION CYST EXCISION Left 02/20/2020   Procedure: LEFT DORSAL GANGLION EXCISION;  Surgeon: Leanora Cover, MD;  Location: [facility];  Service: Orthopedics;  Laterality: Left;   TRIGGER FINGER RELEASE Bilateral 08/29/2021   Procedure: RELEASE TRIGGER FINGER/A-1 PULLEY BILATERAL THUMBS;  Surgeon: Leanora Cover, MD;  Location: [facility];  Service: Orthopedics;  Laterality: Bilateral;  30 MIN   TUBAL LIGATION     Social History   Social History Narrative   Lives at home with family.   Right-handed.   No caffeine.   Immunization History  Administered Date(s) Administered   DTaP 04/25/1976, 07/18/1976, 12/12/1976, 01/15/1980   MMR 01/15/1980   OPV 04/25/1976, 07/18/1976, 01/15/1980   Td 11/30/1997     Objective: Vital Signs: There were no vitals taken for  this visit.   Physical Exam Vitals and nursing note reviewed.  Constitutional:      Appearance: She is well-developed.  HENT:     Head: Normocephalic and atraumatic.  Eyes:     Conjunctiva/sclera: Conjunctivae normal.  Cardiovascular:     Rate and Rhythm: Normal rate and regular rhythm.     Heart sounds: Normal heart sounds.  Pulmonary:     Effort:  Pulmonary effort is normal.     Breath sounds: Normal breath sounds.  Abdominal:     General: Bowel sounds are normal.     Palpations: Abdomen is soft.  Musculoskeletal:     Cervical back: Normal range of motion.  Lymphadenopathy:     Cervical: No cervical adenopathy.  Skin:    General: Skin is warm and dry.     Capillary Refill: Capillary refill takes less than 2 seconds.  Neurological:     Mental Status: She is alert and oriented to person, place, and time.  Psychiatric:        Behavior: Behavior normal.     Musculoskeletal Exam: ***  CDAI Exam: CDAI Score: -- Patient Global: --; Provider Global: -- Swollen: --; Tender: -- Joint Exam 12/26/2021   No joint exam has been documented for this visit   There is currently no information documented on the homunculus. Go to the Rheumatology activity and complete the homunculus joint exam.  Investigation: No additional findings.  Imaging: No results found.  Recent Labs: Lab Results  Component Value Date   WBC 6.8 02/14/2021   HGB 10.4 (L) 02/14/2021   PLT 359 02/14/2021   NA 136 08/26/2021   K 3.7 08/26/2021   CL 104 08/26/2021   CO2 25 08/26/2021   GLUCOSE 95 08/26/2021   BUN 9 08/26/2021   CREATININE 0.72 08/26/2021   BILITOT 0.2 02/14/2021   ALKPHOS 51 02/14/2021   AST 16 02/14/2021   ALT 12 02/14/2021   PROT 6.9 06/02/2021   ALBUMIN 4.1 02/14/2021   CALCIUM 9.3 08/26/2021   GFRAA >60 02/17/2020    Speciality Comments: No specialty comments available.  Procedures:  No procedures performed Allergies: Latex, Lisinopril, and Adhesive [tape]   Assessment / Plan:     Visit Diagnoses: No diagnosis found.  Orders: No orders of the defined types were placed in this encounter.  No orders of the defined types were placed in this encounter.   Face-to-face time spent with patient was *** minutes. Greater than 50% of time was spent in counseling and coordination of care.  Follow-Up Instructions: No follow-ups  on file.   Earnestine Mealing, CMA  Note - This record has been created using Editor, commissioning.  Chart creation errors have been sought, but may not always  have been located. Such creation errors do not reflect on  the standard of medical care.

## 2021-12-14 ENCOUNTER — Ambulatory Visit (INDEPENDENT_AMBULATORY_CARE_PROVIDER_SITE_OTHER): Payer: BC Managed Care – PPO | Admitting: Primary Care

## 2021-12-14 ENCOUNTER — Encounter (INDEPENDENT_AMBULATORY_CARE_PROVIDER_SITE_OTHER): Payer: Self-pay | Admitting: Primary Care

## 2021-12-14 ENCOUNTER — Other Ambulatory Visit: Payer: Self-pay

## 2021-12-14 VITALS — BP 141/83 | HR 74 | Temp 98.1°F | Ht 64.0 in | Wt 305.4 lb

## 2021-12-14 DIAGNOSIS — G8929 Other chronic pain: Secondary | ICD-10-CM

## 2021-12-14 DIAGNOSIS — M5440 Lumbago with sciatica, unspecified side: Secondary | ICD-10-CM | POA: Diagnosis not present

## 2021-12-14 DIAGNOSIS — I10 Essential (primary) hypertension: Secondary | ICD-10-CM

## 2021-12-14 DIAGNOSIS — M5441 Lumbago with sciatica, right side: Secondary | ICD-10-CM

## 2021-12-14 DIAGNOSIS — R634 Abnormal weight loss: Secondary | ICD-10-CM | POA: Diagnosis not present

## 2021-12-14 MED ORDER — AMLODIPINE BESYLATE 10 MG PO TABS: 10.0000 mg | ORAL_TABLET | Freq: Every day | ORAL | 0 refills | Status: DC

## 2021-12-14 NOTE — Progress Notes (Signed)
[facility]       Subjective:   Patient ID: Morgan Molina,     DOB: 1976/02/03, 46 y.o.   MRN: [id number]  HPI Morgan Molina is a 46 year old severe morbid obese female with back pain and vomiting sciatica-followed by Dr. Ophelia Charter.  She presents today to discuss weight loss BMI is 52.42.  She is not able to exercise on a regular basis due to back pain.  Discussed with her chair exercises and referring her to a nutritionist for weight loss.  Also, stressed to her due to her BMI she was a poor surgical candidate but that would be determined by orthopedics. Denies shortness of breath, headaches, chest pain or lower extremity edema   Review of Systems  Musculoskeletal:  Positive for back pain.  All other systems reviewed and are negative.      Objective:  Physical Exam Vitals reviewed.  Constitutional:      Appearance: She is obese.     Comments: morbid  HENT:     Head: Normocephalic.     Right Ear: Tympanic membrane and external ear normal.     Left Ear: Tympanic membrane and external ear normal.     Nose: Nose normal.  Eyes:     Extraocular Movements: Extraocular movements intact.     Pupils: Pupils are equal, round, and reactive to light.  Cardiovascular:     Rate and Rhythm: Normal rate and regular rhythm.  Pulmonary:     Effort: Pulmonary effort is normal.     Breath sounds: Normal breath sounds.  Abdominal:     General: Bowel sounds are normal. There is distension.     Palpations: Abdomen is soft.  Musculoskeletal:        General: Normal range of motion.     Cervical back: Normal range of motion and neck supple.  Skin:    General: Skin is warm and dry.  Neurological:     Mental Status: She is alert and oriented to person, place, and time.  Psychiatric:        Mood and Affect: Mood normal.        Behavior: Behavior normal.        Thought Content: Thought content normal.        Judgment: Judgment normal.   BP (!) 141/83 (BP Location:  Left Arm, Patient Position: Sitting, Cuff Size: Large)    Pulse 74    Temp 98.1 F (36.7 C) (Oral)    Ht 5\' 4"  (1.626 m)    Wt (!) 305 lb 6.4 oz (138.5 kg)    LMP 12/12/2021 (Exact Date)    SpO2 96%    BMI 52.42 kg/m   Past Medical History:  Diagnosis Date   Anxiety    Depression    Hypertension    Muscle spasm    PTSD (post-traumatic stress disorder) 09/28/2019   Syncope     Past Surgical History:  Procedure Laterality Date   BREAST EXCISIONAL BIOPSY Right    CARPAL TUNNEL RELEASE Left 02/20/2020   Procedure: LEFT CARPAL TUNNEL RELEASE;  Surgeon: 02/22/2020, MD;  Location: [facility];  Service: Orthopedics;  Laterality: Left;   CARPAL TUNNEL RELEASE Right 02/28/2021   Procedure: CARPAL TUNNEL RELEASE;  Surgeon: 03/02/2021, MD;  Location: [facility];  Service: Orthopedics;  Laterality: Right;   CHOLECYSTECTOMY     GANGLION CYST EXCISION Left 02/20/2020   Procedure: LEFT DORSAL GANGLION EXCISION;  Surgeon: 02/22/2020,  MD;  Location: [facility];  Service: Orthopedics;  Laterality: Left;   TRIGGER FINGER RELEASE Bilateral 08/29/2021   Procedure: RELEASE TRIGGER FINGER/A-1 PULLEY BILATERAL THUMBS;  Surgeon: Betha Loa, MD;  Location: [facility];  Service: Orthopedics;  Laterality: Bilateral;  30 MIN   TUBAL LIGATION      Family History  Problem Relation Age of Onset   Cancer Mother    Hypertension Mother    Heart attack Mother    Breast cancer Mother 51   CAD Father    Hypertension Father    Diabetes Father     Social History   Tobacco Use  Smoking Status Former   Packs/day: 0.10   Types: Cigarettes   Quit date: 10/26/2019   Years since quitting: 2.1  Smokeless Tobacco Never   Assessment & Plan:  [name] was seen today for follow-up.  Diagnoses and all orders for this visit:  Loss of weight      Amb ref to Medical Nutrition Therapy-MNT  Chronic bilateral low back pain with sciatica, sciatica  laterality unspecified   Followed by Dr. Ophelia Charter   Hypertension, unspecified type Counseled on blood pressure goal of less than 130/80, low-sodium, DASH diet, medication compliance, 150 minutes of moderate intensity exercise per week. Discussed medication compliance, adverse effects.  -     amLODipine (NORVASC) 10 MG tablet; Take 1 tablet (10 mg total) by mouth daily.  Morbid obesity (HCC) Morbid Obesity is > 40 indicating an excess in caloric intake or underlining conditions. This may lead to other co-morbidities. Lifestyle modifications of diet and exercise may reduce obesity.       Amb ref to Medical Nutrition Therapy-MNT  This note has been created with Education officer, environmental. Any transcriptional errors are unintentional.   Grayce Sessions, NP 12/14/2021, 10:08 AM

## 2021-12-14 NOTE — Patient Instructions (Signed)

## 2021-12-21 ENCOUNTER — Other Ambulatory Visit: Payer: Self-pay

## 2021-12-21 ENCOUNTER — Ambulatory Visit (INDEPENDENT_AMBULATORY_CARE_PROVIDER_SITE_OTHER): Payer: BC Managed Care – PPO | Admitting: Physician Assistant

## 2021-12-21 DIAGNOSIS — L2084 Intrinsic (allergic) eczema: Secondary | ICD-10-CM | POA: Diagnosis not present

## 2021-12-21 DIAGNOSIS — L304 Erythema intertrigo: Secondary | ICD-10-CM

## 2021-12-21 MED ORDER — ALCLOMETASONE DIPROPIONATE 0.05 % EX CREA
TOPICAL_CREAM | Freq: Two times a day (BID) | CUTANEOUS | 3 refills | Status: DC | PRN
Start: 2021-12-21 — End: 2022-03-29

## 2021-12-21 MED ORDER — TRIAMCINOLONE ACETONIDE 0.1 % EX CREA: 1.0000 "application " | TOPICAL_CREAM | Freq: Every day | CUTANEOUS | 3 refills | Status: DC | PRN

## 2021-12-21 MED ORDER — KETOCONAZOLE 2 % EX CREA
1.0000 | TOPICAL_CREAM | Freq: Two times a day (BID) | CUTANEOUS | 3 refills | Status: AC
Start: 2021-12-21 — End: 2022-02-01

## 2021-12-21 NOTE — Patient Instructions (Addendum)
Z absorb AF powder  Use the prescription creams 2 times a day

## 2021-12-26 ENCOUNTER — Ambulatory Visit: Payer: BC Managed Care – PPO | Admitting: Physician Assistant

## 2021-12-26 DIAGNOSIS — R0602 Shortness of breath: Secondary | ICD-10-CM

## 2021-12-26 DIAGNOSIS — M5030 Other cervical disc degeneration, unspecified cervical region: Secondary | ICD-10-CM

## 2021-12-26 DIAGNOSIS — M2241 Chondromalacia patellae, right knee: Secondary | ICD-10-CM

## 2021-12-26 DIAGNOSIS — H6523 Chronic serous otitis media, bilateral: Secondary | ICD-10-CM

## 2021-12-26 DIAGNOSIS — L52 Erythema nodosum: Secondary | ICD-10-CM

## 2021-12-26 DIAGNOSIS — M19041 Primary osteoarthritis, right hand: Secondary | ICD-10-CM

## 2021-12-26 DIAGNOSIS — F4310 Post-traumatic stress disorder, unspecified: Secondary | ICD-10-CM

## 2021-12-26 DIAGNOSIS — I10 Essential (primary) hypertension: Secondary | ICD-10-CM

## 2021-12-26 DIAGNOSIS — Z8269 Family history of other diseases of the musculoskeletal system and connective tissue: Secondary | ICD-10-CM

## 2021-12-26 DIAGNOSIS — F32A Depression, unspecified: Secondary | ICD-10-CM

## 2021-12-26 DIAGNOSIS — F410 Panic disorder [episodic paroxysmal anxiety] without agoraphobia: Secondary | ICD-10-CM

## 2021-12-26 DIAGNOSIS — M47816 Spondylosis without myelopathy or radiculopathy, lumbar region: Secondary | ICD-10-CM

## 2021-12-27 LAB — ANAEROBIC AND AEROBIC CULTURE
MICRO NUMBER:: 13012452
MICRO NUMBER:: 13012453
SPECIMEN QUALITY:: ADEQUATE
SPECIMEN QUALITY:: ADEQUATE

## 2021-12-28 ENCOUNTER — Telehealth: Payer: Self-pay

## 2021-12-28 NOTE — Telephone Encounter (Signed)
Left message to call office to check status

## 2021-12-28 NOTE — Telephone Encounter (Signed)
-----   Message from Lavonna Monarch, MD sent at 12/28/2021 10:04 AM EST ----- Check status

## 2021-12-29 ENCOUNTER — Telehealth: Payer: Self-pay | Admitting: Physician Assistant

## 2021-12-29 NOTE — Telephone Encounter (Signed)
[facility] call or mychart message update on patient culture negative

## 2021-12-29 NOTE — Telephone Encounter (Signed)
Says someone tried calling her yesterday, 12/28/21 but she doesn't know why. Can leave message. (Looks like it was [employer])

## 2021-12-29 NOTE — Telephone Encounter (Signed)
-----   Message from Stuart Tafeen, MD sent at 12/28/2021 10:04 AM EST ----- °Check status °

## 2022-01-02 ENCOUNTER — Encounter: Payer: Self-pay | Admitting: Physician Assistant

## 2022-01-02 NOTE — Progress Notes (Signed)
° °  New Patient   Subjective  Morgan Molina is a 46 y.o. AA female who presents for the following: Skin Problem (Patient has lots of inflammation(possible eczema) all over her body. Her scalp, arms back of neck. Treatment hydrocortisone. X years/Patient also thinks she has HS. She gets bumps in her gluteal fold X years Has had some lanced before. They tend to come on when she gets her cycle. They are exquisitely painful).   The following portions of the chart were reviewed this encounter and updated as appropriate:  Tobacco   Allergies   Meds   Problems   Med Hx   Surg Hx   Fam Hx       Objective  Well appearing patient in no apparent distress; mood and affect are within normal limits.  A full examination was performed including scalp, head, eyes, ears, nose, lips, neck, chest, axillae, abdomen, back, buttocks, bilateral upper extremities, bilateral lower extremities, hands, feet, fingers, toes, fingernails, and toenails. All findings within normal limits unless otherwise noted below.  Left Inguinal Area, Right Inguinal Area Hyperpigmentation with irritation and slight scale.  Neck - Anterior Xerosis with hyperpigmentation.    Assessment & Plan  Erythema intertrigo Left Inguinal Area; Right Inguinal Area  Anaerobic and Aerobic Culture - Left Inguinal Area, Right Inguinal Area  ketoconazole (NIZORAL) 2 % cream - Left Inguinal Area, Right Inguinal Area Apply 1 application topically 2 (two) times daily.  alclomethasone (ACLOVATE) 0.05 % cream - Left Inguinal Area, Right Inguinal Area Apply topically 2 (two) times daily as needed (Rash).  Intrinsic (allergic) eczema Neck - Anterior  triamcinolone cream (KENALOG) 0.1 % - Neck - Anterior Apply 1 application topically daily as needed. Not on face or folds     I, Zailee Vallely, PA-C, have reviewed all documentation's for this visit.  The documentation on 01/02/22 for the exam, diagnosis, procedures and orders are all accurate  and complete.

## 2022-01-25 ENCOUNTER — Encounter (INDEPENDENT_AMBULATORY_CARE_PROVIDER_SITE_OTHER): Payer: Self-pay | Admitting: Primary Care

## 2022-01-25 ENCOUNTER — Ambulatory Visit (INDEPENDENT_AMBULATORY_CARE_PROVIDER_SITE_OTHER): Payer: BC Managed Care – PPO | Admitting: Primary Care

## 2022-01-25 ENCOUNTER — Other Ambulatory Visit: Payer: Self-pay

## 2022-01-25 VITALS — BP 132/86 | HR 104 | Temp 98.2°F | Ht 64.0 in | Wt 309.0 lb

## 2022-01-25 DIAGNOSIS — R7303 Prediabetes: Secondary | ICD-10-CM | POA: Diagnosis not present

## 2022-01-25 DIAGNOSIS — Z1211 Encounter for screening for malignant neoplasm of colon: Secondary | ICD-10-CM

## 2022-01-25 DIAGNOSIS — Z0130 Encounter for examination of blood pressure without abnormal findings: Secondary | ICD-10-CM

## 2022-01-25 LAB — POCT GLYCOSYLATED HEMOGLOBIN (HGB A1C): Hemoglobin A1C: 5.8 % — AB (ref 4.0–5.6)

## 2022-01-25 NOTE — Progress Notes (Signed)
?[facility] ? ?Morgan Molina, is a 46 y.o. female ? ?EGB:[id number] ? ?[id number] ? ?DOB - [birth year] ? ?Chief Complaint  ?Patient presents with  ? Follow-up  ?  BP  ?    ? ?Subjective:  ? ?Morgan Molina is a 46 y.o. morbid obese female here today for a follow up visit for the management of high blood pressure.  She was previously prescribed blood pressure medication and she is apprehensive about taking medication.  Therefore she tried her way she changed her diet monitoring her carbohydrates, fats and sodium intake.  Alsoincorporatedexercisingintoher daily routine .  Today her blood pressure is 132/86 slightly above the diagnosis for hypertension without medications.  We have agreed if this lifestyle modification continues and her blood pressure is less than ranges from 130/80 -140/90.  She will not take her BP medication.  We will be monitoring closely every 3 months and she will monitor her blood pressure at home if readings become elevated greater than 140/90 consecutively she will start taking her blood pressure medicine and notify PCP at that time.Patient has No headache, No chest pain, No abdominal pain - No Nausea, No new weakness tingling or numbness, No Cough - shortness of breath. ? ?No problems updated. ? ?Allergies  ?Allergen Reactions  ? Latex Dermatitis  ? Lisinopril Cough  ? Adhesive [Tape] Rash  ? ? ?Past Medical History:  ?Diagnosis Date  ? Anxiety   ? Depression   ? Hypertension   ? Muscle spasm   ? PTSD (post-traumatic stress disorder) 09/28/2019  ? Syncope   ? ? ?Current Outpatient Medications on File Prior to Visit  ?Medication Sig Dispense Refill  ? alclomethasone (ACLOVATE) 0.05 % cream Apply topically 2 (two) times daily as needed (Rash). 60 g 3  ? ketoconazole (NIZORAL) 2 % cream Apply 1 application topically 2 (two) times daily. 60 g 3  ? triamcinolone cream (KENALOG) 0.1 % Apply 1 application topically daily as needed. Not on face or folds 453 g 3  ? amLODipine  (NORVASC) 10 MG tablet Take 1 tablet (10 mg total) by mouth daily. (Patient not taking: Reported on 01/25/2022) 90 tablet 0  ? irbesartan-hydrochlorothiazide (AVALIDE) 150-12.5 MG tablet Take 1 tablet by mouth daily. (Patient not taking: Reported on 09/13/2021) 90 tablet 3  ? ?No current facility-administered medications on file prior to visit.  ? ? ?Objective:  ? ?Vitals:  ? 01/25/22 1617  ?BP: 132/86  ?Pulse: (!) 104  ?Temp: 98.2 ?F (36.8 ?C)  ?TempSrc: Oral  ?SpO2: 98%  ?Weight: (!) 309 lb (140.2 kg)  ?Height: 5\' 4"  (1.626 m)  ? ? ?Exam ?General appearance : Awake, alert, not in any distress. Speech Clear. Not toxic looking ?HEENT: Atraumatic and Normocephalic, pupils equally reactive to light and accomodation ?Neck: Supple, no JVD. No cervical lymphadenopathy.  ?Chest: Good air entry bilaterally, no added sounds  ?CVS: S1 S2 regular, no murmurs.  ?Abdomen: Bowel sounds present, Non tender and not distended with no gaurding, rigidity or rebound. ?Extremities: B/L Lower Ext shows no edema, both legs are warm to touch ?Neurology: Awake alert, and oriented X 3, Non focal ?Skin: No Rash ? ?Data Review ?Lab Results  ?Component Value Date  ? HGBA1C 5.8 (A) 01/25/2022  ? HGBA1C 5.9 (H) 06/16/2021  ? HGBA1C 6.1 (H) 03/25/2021  ? ? ?Assessment & Plan  ? ?1. Prediabetes ?- HgB A1c 5.8 Monitor foods that are high in carbohydrates are the following rice, potatoes, breads, sugars, and pastas.  Reduction in the intake (  eating) will assist in lowering your blood sugars.  ? ?2. Blood pressure check ?132/86 no medication  ?Patient have been counseled extensively about nutrition and exercise. Other issues discussed during this visit include: low cholesterol diet, weight control and daily exercise, foot care, annual eye examinations at Ophthalmology, importance of adherence with medications and regular follow-up. We also discussed long term complications of uncontrolled diabetes and hypertension.  ? ?3. Colon cancer  screening ?cologuard ?Return in about 3 months (around 04/27/2022) for Bp ck . ? ?The patient was given clear instructions to go to ER or return to medical center if symptoms don't improve, worsen or new problems develop. The patient verbalized understanding. The patient was told to call to get lab results if they haven't heard anything in the next week.  ? ?This note has been created with Education officer, environmental. Any transcriptional errors are unintentional.  ? ?Grayce Sessions, NP ?01/29/2022, 11:10 PM  ?

## 2022-03-14 ENCOUNTER — Other Ambulatory Visit (INDEPENDENT_AMBULATORY_CARE_PROVIDER_SITE_OTHER): Payer: Self-pay | Admitting: Primary Care

## 2022-03-14 DIAGNOSIS — I10 Essential (primary) hypertension: Secondary | ICD-10-CM

## 2022-03-15 ENCOUNTER — Emergency Department (HOSPITAL_COMMUNITY): Payer: BC Managed Care – PPO

## 2022-03-15 ENCOUNTER — Observation Stay (HOSPITAL_COMMUNITY)
Admission: EM | Admit: 2022-03-15 | Discharge: 2022-03-16 | Disposition: A | Discharge status: Home / Self Care | Payer: BC Managed Care – PPO | Attending: Internal Medicine | Admitting: Internal Medicine

## 2022-03-15 DIAGNOSIS — Z79899 Other long term (current) drug therapy: Secondary | ICD-10-CM | POA: Insufficient documentation

## 2022-03-15 DIAGNOSIS — Z91040 Latex allergy status: Secondary | ICD-10-CM | POA: Diagnosis not present

## 2022-03-15 DIAGNOSIS — D539 Nutritional anemia, unspecified: Secondary | ICD-10-CM | POA: Insufficient documentation

## 2022-03-15 DIAGNOSIS — N920 Excessive and frequent menstruation with regular cycle: Secondary | ICD-10-CM | POA: Insufficient documentation

## 2022-03-15 DIAGNOSIS — R0602 Shortness of breath: Secondary | ICD-10-CM | POA: Diagnosis present

## 2022-03-15 DIAGNOSIS — I10 Essential (primary) hypertension: Secondary | ICD-10-CM | POA: Insufficient documentation

## 2022-03-15 DIAGNOSIS — R011 Cardiac murmur, unspecified: Secondary | ICD-10-CM | POA: Diagnosis not present

## 2022-03-15 DIAGNOSIS — I200 Unstable angina: Secondary | ICD-10-CM | POA: Insufficient documentation

## 2022-03-15 DIAGNOSIS — Z87891 Personal history of nicotine dependence: Secondary | ICD-10-CM | POA: Insufficient documentation

## 2022-03-15 DIAGNOSIS — R079 Chest pain, unspecified: Secondary | ICD-10-CM | POA: Diagnosis not present

## 2022-03-15 LAB — BASIC METABOLIC PANEL
Anion gap: 10 (ref 5–15)
BUN: 8 mg/dL (ref 6–20)
CO2: 20 mmol/L — ABNORMAL LOW (ref 22–32)
Calcium: 8.9 mg/dL (ref 8.9–10.3)
Chloride: 108 mmol/L (ref 98–111)
Creatinine, Ser: 0.77 mg/dL (ref 0.44–1.00)
GFR, Estimated: >60 mL/min (ref >60)
Glucose, Bld: 108 mg/dL — ABNORMAL HIGH (ref 70–99)
Potassium: 4.4 mmol/L (ref 3.5–5.1)
Sodium: 138 mmol/L (ref 135–145)

## 2022-03-15 LAB — TROPONIN I (HIGH SENSITIVITY)
Troponin I (High Sensitivity): 7 ng/L (ref <18)
Troponin I (High Sensitivity): 7 ng/L (ref <18)

## 2022-03-15 LAB — CBC
HCT: 31.8 % — ABNORMAL LOW (ref 36.0–46.0)
Hemoglobin: 9.9 g/dL — ABNORMAL LOW (ref 12.0–15.0)
MCH: 24.6 pg — ABNORMAL LOW (ref 26.0–34.0)
MCHC: 31.1 g/dL (ref 30.0–36.0)
MCV: 78.9 fL — ABNORMAL LOW (ref 80.0–100.0)
Platelets: 364 10*3/uL (ref 150–400)
RBC: 4.03 MIL/uL (ref 3.87–5.11)
RDW: 18.6 % — ABNORMAL HIGH (ref 11.5–15.5)
WBC: 7.6 10*3/uL (ref 4.0–10.5)
nRBC: 0 % (ref 0.0–0.2)

## 2022-03-15 LAB — LIPID PANEL
Cholesterol: 182 mg/dL (ref 0–200)
HDL: 77 mg/dL (ref >40)
LDL Cholesterol: 91 mg/dL (ref 0–99)
Total CHOL/HDL Ratio: 2.4 RATIO
Triglycerides: 71 mg/dL (ref <150)
VLDL: 14 mg/dL (ref 0–40)

## 2022-03-15 LAB — D-DIMER, QUANTITATIVE: D-Dimer, Quant: 0.72 ug/mL-FEU — ABNORMAL HIGH (ref 0.00–0.50)

## 2022-03-15 MED ORDER — IOHEXOL 350 MG/ML SOLN
100.0000 mL | Freq: Once | INTRAVENOUS | Status: AC | PRN
  Administered 2022-03-15: 100 mL via INTRAVENOUS

## 2022-03-15 MED ORDER — ASPIRIN EC 81 MG PO TBEC
81.0000 mg | DELAYED_RELEASE_TABLET | Freq: Every day | ORAL | Status: DC
  Administered 2022-03-16: 81 mg via ORAL
  Filled 2022-03-15 (×2): qty 1

## 2022-03-15 MED ORDER — ONDANSETRON HCL 4 MG/2ML IJ SOLN: 4.0000 mg | Freq: Four times a day (QID) | INTRAMUSCULAR | Status: DC | PRN

## 2022-03-15 MED ORDER — ACETAMINOPHEN 325 MG PO TABS
650.0000 mg | ORAL_TABLET | Freq: Four times a day (QID) | ORAL | Status: DC | PRN
  Filled 2022-03-15: qty 2

## 2022-03-15 MED ORDER — ASPIRIN 81 MG PO CHEW
324.0000 mg | CHEWABLE_TABLET | Freq: Once | ORAL | Status: AC
  Administered 2022-03-15: 324 mg via ORAL
  Filled 2022-03-15: qty 4

## 2022-03-15 MED ORDER — HEPARIN SODIUM (PORCINE) 5000 UNIT/ML IJ SOLN
5000.0000 [IU] | Freq: Three times a day (TID) | INTRAMUSCULAR | Status: DC
  Administered 2022-03-15 – 2022-03-16 (×2): 5000 [IU] via SUBCUTANEOUS
  Filled 2022-03-15 (×2): qty 1

## 2022-03-15 MED ORDER — IRBESARTAN 150 MG PO TABS
150.0000 mg | ORAL_TABLET | Freq: Every day | ORAL | Status: DC
  Administered 2022-03-16: 150 mg via ORAL
  Filled 2022-03-15: qty 1

## 2022-03-15 MED ORDER — ACETAMINOPHEN 650 MG RE SUPP: 650.0000 mg | Freq: Four times a day (QID) | RECTAL | Status: DC | PRN

## 2022-03-15 MED ORDER — NITROGLYCERIN 0.4 MG SL SUBL
0.4000 mg | SUBLINGUAL_TABLET | SUBLINGUAL | Status: DC | PRN
  Administered 2022-03-15 (×2): 0.4 mg via SUBLINGUAL
  Filled 2022-03-15 (×2): qty 1

## 2022-03-15 MED ORDER — HYDROCHLOROTHIAZIDE 12.5 MG PO TABS
12.5000 mg | ORAL_TABLET | Freq: Every day | ORAL | Status: DC
  Filled 2022-03-15: qty 1

## 2022-03-15 MED ORDER — SENNOSIDES-DOCUSATE SODIUM 8.6-50 MG PO TABS: 1.0000 | ORAL_TABLET | Freq: Every evening | ORAL | Status: DC | PRN

## 2022-03-15 MED ORDER — IBUPROFEN 400 MG PO TABS
600.0000 mg | ORAL_TABLET | Freq: Four times a day (QID) | ORAL | Status: DC | PRN
  Administered 2022-03-15 – 2022-03-16 (×2): 600 mg via ORAL
  Filled 2022-03-15 (×2): qty 1

## 2022-03-15 MED ORDER — AMLODIPINE BESYLATE 5 MG PO TABS
10.0000 mg | ORAL_TABLET | Freq: Every day | ORAL | Status: DC
Start: 2022-03-15 — End: 2022-03-16
  Administered 2022-03-15: 10 mg via ORAL
  Filled 2022-03-15 (×2): qty 2

## 2022-03-15 MED ORDER — IRBESARTAN-HYDROCHLOROTHIAZIDE 150-12.5 MG PO TABS: 1.0000 | ORAL_TABLET | Freq: Every day | ORAL | Status: DC

## 2022-03-15 MED ORDER — OXYCODONE HCL 5 MG PO TABS: 5.0000 mg | ORAL_TABLET | ORAL | Status: DC | PRN

## 2022-03-15 MED ORDER — SENNA 8.6 MG PO TABS
1.0000 | ORAL_TABLET | Freq: Two times a day (BID) | ORAL | Status: DC
  Filled 2022-03-15 (×2): qty 1

## 2022-03-15 MED ORDER — ONDANSETRON HCL 4 MG PO TABS: 4.0000 mg | ORAL_TABLET | Freq: Four times a day (QID) | ORAL | Status: DC | PRN

## 2022-03-15 MED ORDER — BISACODYL 5 MG PO TBEC: 5.0000 mg | DELAYED_RELEASE_TABLET | Freq: Every day | ORAL | Status: DC | PRN

## 2022-03-15 NOTE — ED Provider Triage Note (Signed)
Emergency Medicine Provider Triage Evaluation Note ? ?Morgan Molina , a 46 y.o. female  was evaluated in triage.  Pt complains of cp. Report sharp pain to L upper chest radiates to L arm and back which started acutely this AM while she was getting ready to go to work.  Report associated dizziness and SOB with the pain.  No cardiac hx, no hx of PE. ? ?Review of Systems  ?Positive: As above ?Negative: As above ? ?Physical Exam  ?BP (!) 175/117 (BP Location: Right Wrist)   Pulse 100   Temp 98.2 ?F (36.8 ?C)   Resp (!) 22   Ht 5\' 4"  (1.626 m)   Wt (!) 140.2 kg   SpO2 100%   BMI 53.04 kg/m?  ?Gen:   Awake, appears uncomfortable ?Resp:  Normal effort  ?MSK:   Moves extremities without difficulty  ?Other:   ? ?Medical Decision Making  ?Medically screening exam initiated at 9:37 AM.  Appropriate orders placed.  Morgan Molina was informed that the remainder of the evaluation will be completed by another provider, this initial triage assessment does not replace that evaluation, and the importance of remaining in the ED until their evaluation is complete. ? ? ?  ?Domenic Moras, PA-C ?03/15/22 [phone] ? ?

## 2022-03-15 NOTE — ED Notes (Signed)
Up tp the br   no assistance needed ?

## 2022-03-15 NOTE — Telephone Encounter (Signed)
Requested Prescriptions  ?Pending Prescriptions Disp Refills  ?? amLODipine (NORVASC) 10 MG tablet [Pharmacy Med Name: AMLODIPINE BESYLATE 10 MG TAB] 90 tablet 0  ?  Sig: TAKE 1 TABLET BY MOUTH EVERY DAY  ?  ? Cardiovascular: Calcium Channel Blockers 2 Passed - 03/14/2022  2:34 PM  ?  ?  Passed - Last BP in normal range  ?  BP Readings from Last 1 Encounters:  ?03/15/22 (!) 163/99  ?   ?  ?  Passed - Last Heart Rate in normal range  ?  Pulse Readings from Last 1 Encounters:  ?03/15/22 66  ?   ?  ?  Passed - Valid encounter within last 6 months  ?  Recent Outpatient Visits   ?      ? 1 month ago Prediabetes  ? [facility] Kerin Perna, NP  ? 3 months ago Loss of weight  ? [facility] Kerin Perna, NP  ? 6 months ago Morbidly obese ([place])  ? [facility] Kerin Perna, NP  ?  ?  ?Future Appointments   ?        ? In 1 month Edwards, Milford Cage, NP [facility]  ?  ? ?  ?  ?  ? ?

## 2022-03-15 NOTE — ED Notes (Signed)
The pt is eating a sandwich  she also c/o chest pain ?

## 2022-03-15 NOTE — H&P (Addendum)
? ? ? ?Date: 03/15/2022     ?     ?     ?Patient Name:  Morgan Molina MRN: [id number]  ?DOB: 1976-10-06 Age / Sex: 46 y.o., female   ?PCP: Grayce SessionsEdwards, Michelle P, NP    ?     ?Medical Service: Internal Medicine Teaching Service    ?     ?Attending Physician: Dr. Reymundo PollGuilloud, Carolyn, MD    ?First Contact: Dr. Welton FlakesKhan Pager: [phone]  ?Second Contact: Dr. Kirke CorinAmponsah Pager: [phone]  ?     ?After Hours (After 5p/  First Contact Pager: [phone]  ?weekends / holidays): Second Contact Pager: [phone]  ? ?Chief Complaint: chest pain ? ?History of Present Illness:  ?Patient is a 46 year old female with past medical history of hypertension, anxiety, depression, and PTSD presented to the ED with chest pain.  Patient states chest pain started suddenly in the morning and did not go away requiring her to come to the ED.  Arrived the chest pain as sharp and dull sensation in the center of her chest.  She also endorsed shortness of breath and numbness and tingling in her left arm.  She also endorsed significant weakness in her left arm that has since resolved.  She denied any radiation to her jaw or any diaphoresis.  She is denying any cold like symptoms.  She stated the pain does radiate to her back and shoulders.  She had taken ibuprofen but that did not relieve her pain.  Pain improved after arrival to the ED but has remained 3-4 out of 10 in severity from her 10 out of 10 in severity. ? ?She states she has history of hypertension but is not taking any medications.  It has been a 2 weeks that she checked her blood pressure and uses a wrist monitor and reported a normal reading at that time.  She is focusing on diet, exercise and meditation to control all her medical additions.  She states she has panic attacks previously but was unable to tell when the last one occurred.  She stated it did not occur this year and this episode did not feel like her previous panic attacks.  She endorses moderate stress and states it is well managed.  She  reports working out 3 times a week with focus on cardio and light weight training. ? ?She reports dizziness that has been occurring for the past 2 weeks but has not had a syncopal episode.  She states her previous syncopal episodes have been attributed to vasovagal syndrome.  ? ?Review of Systems: Negative unless stated in the HPI. ? ?ED Course: ?Patient given nitroglycerin in the ED and aspirin 324 mg with improvement in her pain with. ? ?Past Medical History:  ?Past Medical History:  ?Diagnosis Date  ? Anxiety   ? Depression   ? Hypertension   ? Muscle spasm   ? PTSD (post-traumatic stress disorder) 09/28/2019  ? Syncope   ? ? ?Meds: Patient denying any current medication use. ?No outpatient medications have been marked as taking for the 03/15/22 encounter [facility](Hospital Encounter).  ? ? ?Allergies: ?Allergies as of 03/15/2022 - Review Complete 03/15/2022  ?Allergen Reaction Noted  ? Latex Dermatitis 12/05/2019  ? Lisinopril Cough 11/11/2018  ? Adhesive [tape] Rash 04/02/2019  ? ? ?Past Surgical History: ?Past Surgical History:  ?Procedure Laterality Date  ? BREAST EXCISIONAL BIOPSY Right   ? CARPAL TUNNEL RELEASE Left 02/20/2020  ? Procedure: LEFT CARPAL TUNNEL RELEASE;  Surgeon: Betha LoaKuzma, Kevin, MD;  Location:  [facility];  Service: Orthopedics;  Laterality: Left;  ? CARPAL TUNNEL RELEASE Right 02/28/2021  ? Procedure: CARPAL TUNNEL RELEASE;  Surgeon: Betha Loa, MD;  Location: [facility];  Service: Orthopedics;  Laterality: Right;  ? CHOLECYSTECTOMY    ? GANGLION CYST EXCISION Left 02/20/2020  ? Procedure: LEFT DORSAL GANGLION EXCISION;  Surgeon: Betha Loa, MD;  Location: [facility];  Service: Orthopedics;  Laterality: Left;  ? TRIGGER FINGER RELEASE Bilateral 08/29/2021  ? Procedure: RELEASE TRIGGER FINGER/A-1 PULLEY BILATERAL THUMBS;  Surgeon: Betha Loa, MD;  Location: [facility];  Service: Orthopedics;  Laterality: Bilateral;  30 MIN  ? TUBAL LIGATION     ? ? ?Family History:  ?She stated both her parents have HTN and her father has CHF and mother had multiple MI with first one in her early 47s. Gastric cancer in her brother. Prostate cancer in her father and breast cancer in her mother.  ?Family History  ?Problem Relation Age of Onset  ? Cancer Mother   ? Hypertension Mother   ? Heart attack Mother   ? Breast cancer Mother 75  ? CAD Father   ? Hypertension Father   ? Diabetes Father   ? ? ?Social History:  ?Lives with husband. Able to perform all ADL and IADLs. Works as a Production designer, theatre/television/film. Has 5 kids, 4 sons and 1 daughter. 1 son passed away.  ?Social History  ? ?Socioeconomic History  ? Marital status: Married  ?  Spouse name: Not on file  ? Number of children: 5  ? Years of education: some college  ? Highest education level: Not on file  ?Occupational History  ? Occupation: Clinical biochemist  ?Tobacco Use  ? Smoking status: Former  ?  Packs/day: 0.10  ?  Types: Cigarettes  ?  Quit date: 10/26/2019  ?  Years since quitting: 2.3  ? Smokeless tobacco: Never  ?Vaping Use  ? Vaping Use: Never used  ?Substance and Sexual Activity  ? Alcohol use: Yes  ?  Comment: occasional  ? Drug use: No  ? Sexual activity: Not on file  ?Other Topics Concern  ? Not on file  ?Social History Narrative  ? Lives at home with family.  ? Right-handed.  ? No caffeine.  ? ?Social Determinants of Health  ? ?Financial Resource Strain: Not on file  ?Food Insecurity: Not on file  ?Transportation Needs: Not on file  ?Physical Activity: Not on file  ?Stress: Not on file  ?Social Connections: Not on file  ?Intimate Partner Violence: Not on file  ? ?Physical Exam: ?Blood pressure 133/80, pulse 71, temperature 98.2 ?F (36.8 ?C), resp. rate 11, height 5\' 4"  (1.626 m), weight (!) 140.2 kg, last menstrual period 02/13/2022, SpO2 98 %. ?General: Obese female in NAD laying in bed ?HENT:NCAT  ?Lungs: CTAB, no wheeze, rales, or rhonchi ?Cardiovascular: NSR, 2+ pulses radial and pedal pulses ?Abdomen: No TTP, normal  bowel sounds ?MSK: no asymmetry, normal muscle bulk and tone ?Skin: no lesions present on exposed skin ?Neuro: alert and oriented x4. CN grossly normal. 5/5 strength in BUE and BLE.  ?Psych: normal mood and normal affect ? ?Diagnostics: ? ? ?  Latest Ref Rng & Units 03/15/2022  ?  9:44 AM 02/14/2021  ?  3:42 PM 11/25/2020  ?  5:40 PM  ?CBC  ?WBC 4.0 - 10.5 K/uL 7.6   6.8   6.9    ?Hemoglobin 12.0 - 15.0 g/dL 9.9   11/27/2020  10.6    ?Hematocrit 36.0 - 46.0 % 31.8   33.4   34.1    ?Platelets 150 - 400 K/uL 364   359   367    ?  ? ?  Latest Ref Rng & Units 03/15/2022  ?  9:44 AM 08/26/2021  ?  2:30 PM 06/02/2021  ? 10:57 AM  ?CMP  ?Glucose 70 - 99 mg/dL 734   95     ?BUN 6 - 20 mg/dL 8   9     ?Creatinine 0.44 - 1.00 mg/dL 2.87   6.81     ?Sodium 135 - 145 mmol/L 138   136     ?Potassium 3.5 - 5.1 mmol/L 4.4   3.7     ?Chloride 98 - 111 mmol/L 108   104     ?CO2 22 - 32 mmol/L 20   25     ?Calcium 8.9 - 10.3 mg/dL 8.9   9.3     ?Total Protein 6.1 - 8.1 g/dL   6.9    ?  ? ?Troponin wnl x2 ?D Dimer 0.72 ? ?DG Chest 2 View ? ?Result Date: 03/15/2022 ?CLINICAL DATA:  Chest pain and shortness of breath. EXAM: CHEST - 2 VIEW  IMPRESSION: 1. Slightly increased prominence of the aortic arch and proximal descending thoracic aorta on the frontal view. This could be related to the projection of the image but cannot exclude pathology in this area. Recommend further characterization with a chest CT with IV contrast. 2. Slightly decreased lung volumes without focal lung disease. Electronically Signed   By: Richarda Overlie M.D.   On: 03/15/2022 10:01  ? ?CT Angio Chest PE W/Cm &/Or Wo Cm ? ?Result Date: 03/15/2022 ?CLINICAL DATA:  Left upper chest pain, radiates to left arm and back EXAM: CT ANGIOGRAPHY CHEST WITH CONTRAST ?IMPRESSION: 1. Negative for pulmonary embolism. 2. Mild enlargement of the main pulmonary artery, which can be seen in the setting of pulmonary hypertension. Electronically Signed   By: Wiliam Ke M.D.   On: 03/15/2022 13:41   ?  ? ?EKG: personally reviewed my interpretation is NSR. ? ?CXR: personally reviewed my interpretation is no acute abnormality present. ? ?Assessment & Plan by Problem: ? ?Patient is a 46 year old fema

## 2022-03-15 NOTE — ED Notes (Signed)
Pt c/o a headache now chest pain less ?

## 2022-03-15 NOTE — ED Notes (Signed)
Patient walking to bathroom at this time.   ?

## 2022-03-15 NOTE — ED Notes (Signed)
Dr Acie Fredrickson at   the bedside ?

## 2022-03-15 NOTE — Consult Note (Addendum)
?Cardiology Consultation:  ? ?Patient ID: Morgan Molina ?MRN: [id number]; DOB: 1976/01/08 ? ?Admit date: 03/15/2022 ?Date of Consult: 03/15/2022 ? ?PCP:  Grayce SessionsEdwards, Michelle P, NP ?  ?[facility] Providers ?Cardiologist:  None  ? ?Patient Profile:  ? ?Morgan MarketCristalh S Molina is a 46 y.o. morbidly obese African-American female with a history of syncope in 2020 with no significant arrhythmias on event monitor felt to be vasovagal in nature, hypertension, prediabetes, anxiety/depression, PTSD, and prior smoking history (quit in 12/2021) who is being seen today for the evaluation of chest pain at the request of Dr. Rosalia Hammersay. ? ?History of Present Illness:  ? ?[name] is a 46 year old female with the above history. Patient was seen in the ED in 12/2020 for recurrent syncope and dizziness.  Syncopal episodes sounded vasovagal in nature. Looks like patient was referred to see Dr. Elease HashimotoNahser after this for further evaluation of this. We set up a Event Monitor prior to this which showed normal sinus rhythm with no significant arrhythmias. It does not look like the patient ever followed up with us after this. No other prior cardiac work-up. ? ?Patient presents to the ED today for further evaluation of chest pain.  Patient reports she was in her usual state of health until this morning when she developed sudden onset of chest pain.  She woke up feeling fine but after showering she noticed substernal chest pain that radiated to her back and left shoulder.  She describes both sharp pain as well as pressure.  She initially thought she may have just slept wrong so she took some ibuprofen and went to work.  However on her drive to work, the pain worsened and she started to have some numbness of her left hand.  She states the pain was as bad as it 12/10 initially.  She reports some associated shortness of breath with this pain and states when the pain was severe she had trouble speaking.  Therefore, she decided to come to the hospital rather than  to work.  Pain improved after receiving aspirin and sublingual nitro drip and in the ED.  However, she is still reporting mild 3-4/10 chest pain.  The pain has been constant since around 8:45 AM this morning.  She denies any chest pain prior to today.  She has been going to the gym 3 days a week she walks on treadmill and does some light weight and denies any chest pain at this. She reports some dyspnea with exertion due to her weight but does not sound like she has any significant shortness of breath at rest. No orthopnea or PND. She reports some lower extremity edema of the last 2 weeks. She denies any recent fevers or illnesses. She has some nasal congestion with allergies but no cough. ? ?She has chronic dizzinesss and frequent episodes of near syncope. Her  last syncopal episodes was 2 months ago. We discussed these events. They occur randomly - often when she is stressed but she does not have to be. She has had some near syncopal episodes while driving. She states she gets dizzy and "swimmy headed" and starts feeling nauseous and flushed. When this happens, she sits down and tries to calm herself down. She states she will drink cold water or put a cold wash cloth on her face and symptoms will resolve. She denies any palpitations during these episodes but does report occasional "heart racing" and "skipped beats" outside of these times. She states the dizziness has been worse the  last 2 weeks and occurring on a regular basis. She also reports very heavy menstrual bleeding with clots that lasted a full month (from 4/8 to 5/8) so wondering if this is contributing to worsening dizziness. She has reached out to [facility] for this and is waiting to here back. Otherwise, no abnormal bleeding (other than a nose bleed the other day). ? ?Upon arrival to the ED, patient hypertensive with BP as high as 175/117. EKG showed normal sinus rhythm, rate 89 bpm, with slight ST depression in inferior leads (primarily in leads III).  High-sensitivity troponin negative x2. Chest x-ray showed no acute findings. D-Dimer elevated at 0.72. Chest CTA negative for PE but did show mild enlargement of main pulmonary artery. WBC 7.6, Hgb 9.8, Plts 364. Na 138, K 4.4, Glucose 108, BUN 8, Cr 0.77. Cardiology consulted for further evaluation.  ? ?Patient continues to have 3-4/10 chest pain at this time but appears comfortably. She also has some pinpoint chest wall tenderness with palpation of chest but she states this pain is different. ? ?Of note, patient has a history of hypertension and has Amlodipine and Irbesartan-HCTZ on her PTA medication list; however, she is not taking these. Reviewed most recent visit with PCP, patient has wanted to try lifestyle modifications with diet changes and exercise. Plan is to monitor closely every 3 months. ? ?She does have a family history of heart disease. Both her mother, father, maternal grandmother, paternal grandmother, and paternal aunt have had heart attacks. Her mother had her first heart attack in her 85s. ? ?Past Medical History:  ?Diagnosis Date  ? Anxiety   ? Depression   ? Hypertension   ? Muscle spasm   ? PTSD (post-traumatic stress disorder) 09/28/2019  ? Syncope   ? ? ?Past Surgical History:  ?Procedure Laterality Date  ? BREAST EXCISIONAL BIOPSY Right   ? CARPAL TUNNEL RELEASE Left 02/20/2020  ? Procedure: LEFT CARPAL TUNNEL RELEASE;  Surgeon: Betha Loa, MD;  Location: [facility];  Service: Orthopedics;  Laterality: Left;  ? CARPAL TUNNEL RELEASE Right 02/28/2021  ? Procedure: CARPAL TUNNEL RELEASE;  Surgeon: Betha Loa, MD;  Location: [facility];  Service: Orthopedics;  Laterality: Right;  ? CHOLECYSTECTOMY    ? GANGLION CYST EXCISION Left 02/20/2020  ? Procedure: LEFT DORSAL GANGLION EXCISION;  Surgeon: Betha Loa, MD;  Location: [facility];  Service: Orthopedics;  Laterality: Left;  ? TRIGGER FINGER RELEASE Bilateral 08/29/2021  ? Procedure: RELEASE  TRIGGER FINGER/A-1 PULLEY BILATERAL THUMBS;  Surgeon: Betha Loa, MD;  Location: [facility];  Service: Orthopedics;  Laterality: Bilateral;  30 MIN  ? TUBAL LIGATION    ?  ? ?Home Medications:  ?Prior to Admission medications   ?Medication Sig Start Date End Date Taking? Authorizing Provider  ?alclomethasone (ACLOVATE) 0.05 % cream Apply topically 2 (two) times daily as needed (Rash). 12/21/21   Sheffield, Harvin Hazel R, PA-C  ?amLODipine (NORVASC) 10 MG tablet TAKE 1 TABLET BY MOUTH EVERY DAY 03/15/22   Grayce Sessions, NP  ?irbesartan-hydrochlorothiazide (AVALIDE) 150-12.5 MG tablet Take 1 tablet by mouth daily. ?Patient not taking: Reported on 09/13/2021 03/30/21   Julieanne Manson, MD  ?triamcinolone cream (KENALOG) 0.1 % Apply 1 application topically daily as needed. Not on face or folds 12/21/21   Glyn Ade, PA-C  ? ? ?Inpatient Medications: ?Scheduled Meds: ? ?Continuous Infusions: ? ?PRN Meds: ?nitroGLYCERIN ? ?Allergies:    ?Allergies  ?Allergen Reactions  ? Latex Dermatitis  ? Lisinopril  Cough  ? Adhesive [Tape] Rash  ? ? ?Social History:   ?Social History  ? ?Socioeconomic History  ? Marital status: Married  ?  Spouse name: Not on file  ? Number of children: 5  ? Years of education: some college  ? Highest education level: Not on file  ?Occupational History  ? Occupation: Clinical biochemist  ?Tobacco Use  ? Smoking status: Former  ?  Packs/day: 0.10  ?  Types: Cigarettes  ?  Quit date: 10/26/2019  ?  Years since quitting: 2.3  ? Smokeless tobacco: Never  ?Vaping Use  ? Vaping Use: Never used  ?Substance and Sexual Activity  ? Alcohol use: Yes  ?  Comment: occasional  ? Drug use: No  ? Sexual activity: Not on file  ?Other Topics Concern  ? Not on file  ?Social History Narrative  ? Lives at home with family.  ? Right-handed.  ? No caffeine.  ? ?Social Determinants of Health  ? ?Financial Resource Strain: Not on file  ?Food Insecurity: Not on file  ?Transportation Needs: Not on file   ?Physical Activity: Not on file  ?Stress: Not on file  ?Social Connections: Not on file  ?Intimate Partner Violence: Not on file  ?  ?Family History:   ?Family History  ?Problem Relation Age of Onset  ? Cancer

## 2022-03-15 NOTE — ED Provider Notes (Signed)
I assumed care of patient at shift change from previous team, please see their note for full H&P. ?Briefly patient is having chest pain.  Plan is to follow-up on recommendations from cardiology who has already been consulted to see the patient. ?Dr. Elease Hashimoto saw the patient.  He recommends medical admission, plan for CT coronary study in the morning. ? ?I discussed the plan of care with the patient.  At the time of my evaluation she is resting comfortably in no distress. ? ?I spoke with IMTS who will see the patient for admission. ? ?The patient appears reasonably stabilized for admission considering the current resources, flow, and capabilities available in the ED at this time, and I doubt any other [facility] requiring further screening and/or treatment in the ED prior to admission assuming timely admission and bed placement. ? ?Note: Portions of this report may have been transcribed using voice recognition software. Every effort was made to ensure accuracy; however, inadvertent computerized transcription errors may be present ? ? ? ? ? ?  ?Cristina Gong, [place] ?03/16/22 0004 ? ?  ?Glendora Score, MD ?03/16/22 0033 ? ?

## 2022-03-15 NOTE — ED Triage Notes (Signed)
Pt. Stated, I was getting ready for work and Im having chest pain, shoulder pain, SOB . Pt moaning at triage with pain ?

## 2022-03-15 NOTE — Hospital Course (Signed)
Morgan Molina PMHx HTN, anxiety, and depression presenting to the ED with chest pain. Had lightheadedness, SOB, and a tingling in left arm which has resolved.  ?EKG changes noted.  ? ? ? ?Social Hx  ?Previous smoker, current vaper ? ? ?Fam Hx  ?CAD, MI in mom in her 62s.  ? ? ?

## 2022-03-15 NOTE — ED Notes (Signed)
Patient states she is chest pain free at this time, she continues with HA, awaiting motrin orders. ?

## 2022-03-15 NOTE — ED Provider Notes (Signed)
?[facility] ?Provider Note ? ? ?CSN: [id number] ?Arrival date & time: 03/15/22  [id number] ? ?  ? ?History ? ?Chief Complaint  ?Patient presents with  ? Chest Pain  ? Shoulder Pain  ? Shortness of Breath  ? Back Pain  ? ? ?Morgan Molina is a 46 y.o. female with PMHx HTN (not currently on medications), anxiety, depression who presents to the ED today with complaint of gradual onset, constant, pressure like sensation to left chest that began this morning after showering. She also complains of lightheadedness, SOB, and a tingling sensation in her left arm that has since resolved. She decided to come to the ED for further eval. She reports recently quitting smoking in February of this year. She does currently vape however. She also mentions Fhx of CAD with mother having MI in her 3850's. Pt denies any nausea, vomiting, diaphoresis.  ? ?The history is provided by the patient and medical records.  ? ?  ? ?Home Medications ?Prior to Admission medications   ?Medication Sig Start Date End Date Taking? Authorizing Provider  ?alclomethasone (ACLOVATE) 0.05 % cream Apply topically 2 (two) times daily as needed (Rash). 12/21/21   Sheffield, Harvin HazelKelli R, PA-C  ?amLODipine (NORVASC) 10 MG tablet TAKE 1 TABLET BY MOUTH EVERY DAY 03/15/22   Grayce SessionsEdwards, Michelle P, NP  ?irbesartan-hydrochlorothiazide (AVALIDE) 150-12.5 MG tablet Take 1 tablet by mouth daily. ?Patient not taking: Reported on 09/13/2021 03/30/21   Julieanne MansonMulberry, Elizabeth, MD  ?triamcinolone cream (KENALOG) 0.1 % Apply 1 application topically daily as needed. Not on face or folds 12/21/21   Glyn AdeSheffield, Kelli R, PA-C  ?   ? ?Allergies    ?Latex, Lisinopril, and Adhesive [tape]   ? ?Review of Systems   ?Review of Systems  ?Constitutional:  Negative for chills and fever.  ?Respiratory:  Positive for shortness of breath. Negative for cough.   ?Cardiovascular:  Positive for chest pain. Negative for palpitations and leg swelling.  ?Gastrointestinal:  Negative  for nausea and vomiting.  ?Neurological:  Positive for light-headedness.  ?All other systems reviewed and are negative. ? ?Physical Exam ?Updated Vital Signs ?BP (!) 163/99   Pulse 66   Temp 98.2 ?F (36.8 ?C)   Resp 16   Ht 5\' 4"  (1.626 m)   Wt (!) 140.2 kg   LMP 02/13/2022 Comment: pt. stated, I bleed for 30 days.  SpO2 100%   BMI 53.04 kg/m?  ? ?Physical Exam ?Vitals and nursing note reviewed.  ?Constitutional:   ?   Appearance: She is obese. She is not ill-appearing or diaphoretic.  ?HENT:  ?   Head: Normocephalic and atraumatic.  ?Eyes:  ?   Conjunctiva/sclera: Conjunctivae normal.  ?Cardiovascular:  ?   Rate and Rhythm: Normal rate and regular rhythm.  ?   Pulses:     ?     Radial pulses are 2+ on the right side and 2+ on the left side.  ?     Dorsalis pedis pulses are 2+ on the right side and 2+ on the left side.  ?   Heart sounds: Normal heart sounds.  ?Pulmonary:  ?   Effort: Pulmonary effort is normal.  ?   Breath sounds: Normal breath sounds. No decreased breath sounds, wheezing, rhonchi or rales.  ?Chest:  ?   Chest wall: No tenderness.  ?Abdominal:  ?   Palpations: Abdomen is soft.  ?   Tenderness: There is no abdominal tenderness. There is no guarding or rebound.  ?Musculoskeletal:  ?  Cervical back: Neck supple.  ?Skin: ?   General: Skin is warm and dry.  ?Neurological:  ?   Mental Status: She is alert.  ? ? ?ED Results / Procedures / Treatments   ?Labs ?(all labs ordered are listed, but only abnormal results are displayed) ?Labs Reviewed  ?BASIC METABOLIC PANEL - Abnormal; Notable for the following components:  ?    Result Value  ? CO2 20 (*)   ? Glucose, Bld 108 (*)   ? All other components within normal limits  ?CBC - Abnormal; Notable for the following components:  ? Hemoglobin 9.9 (*)   ? HCT 31.8 (*)   ? MCV 78.9 (*)   ? MCH 24.6 (*)   ? RDW 18.6 (*)   ? All other components within normal limits  ?D-DIMER, QUANTITATIVE - Abnormal; Notable for the following components:  ? D-Dimer, Quant  0.72 (*)   ? All other components within normal limits  ?TROPONIN I (HIGH SENSITIVITY)  ?TROPONIN I (HIGH SENSITIVITY)  ? ? ?EKG ?EKG Interpretation ? ?Date/Time:  Wednesday Mar 15 2022 09:35:35 EDT ?Ventricular Rate:  89 ?PR Interval:  176 ?QRS Duration: 80 ?QT Interval:  360 ?QTC Calculation: 438 ?R Axis:   37 ?Text Interpretation: Normal sinus rhythm Septal infarct , age undetermined Abnormal ECG When compared with ECG of 26-Aug-2021 14:00, PREVIOUS ECG IS PRESENT diffuse nsst changes increased from first prior ekg 26 Aug 2021 Confirmed by Margarita Grizzle [phone]) on 03/15/2022 2:10:08 PM ? ?Radiology ?DG Chest 2 View ? ?Result Date: 03/15/2022 ?CLINICAL DATA:  Chest pain and shortness of breath. EXAM: CHEST - 2 VIEW COMPARISON:  Chest radiograph 03/28/2021 FINDINGS: Slightly decreased lung volumes. Prominent lung markings in the lower chest could represent atelectasis. Heart size is within normal limits. The thoracic aortic arch and proximal descending thoracic aorta are slightly prominent compared to the previous examination. Negative for pneumothorax. Trachea is midline. Mild degenerative changes in the thoracic spine. IMPRESSION: 1. Slightly increased prominence of the aortic arch and proximal descending thoracic aorta on the frontal view. This could be related to the projection of the image but cannot exclude pathology in this area. Recommend further characterization with a chest CT with IV contrast. 2. Slightly decreased lung volumes without focal lung disease. Electronically Signed   By: Richarda Overlie M.D.   On: 03/15/2022 10:01  ? ?CT Angio Chest PE W/Cm &/Or Wo Cm ? ?Result Date: 03/15/2022 ?CLINICAL DATA:  Left upper chest pain, radiates to left arm and back EXAM: CT ANGIOGRAPHY CHEST WITH CONTRAST TECHNIQUE: Multidetector CT imaging of the chest was performed using the standard protocol during bolus administration of intravenous contrast. Multiplanar CT image reconstructions and MIPs were obtained to evaluate  the vascular anatomy. RADIATION DOSE REDUCTION: This exam was performed according to the departmental dose-optimization program which includes automated exposure control, adjustment of the mA and/or kV according to patient size and/or use of iterative reconstruction technique. CONTRAST:  OMNIPAQUE IOHEXOL 350 MG/ML SOLN COMPARISON:  None Available. FINDINGS: Cardiovascular: Satisfactory opacification of the pulmonary arteries to the segmental level. No evidence of pulmonary embolism. Enlargement of the main pulmonary artery, which measures up to 3.0 cm, just above the upper limit of normal. The heart is mildly enlarged. No pericardial effusion. No reflux of contrast into the hepatic veins. Mediastinum/Nodes: No enlarged mediastinal, hilar, or axillary lymph nodes. Thyroid gland, trachea, and esophagus demonstrate no significant findings. Lungs/Pleura: No focal pulmonary opacity or pleural effusion. Upper Abdomen: No acute abnormality. Musculoskeletal: No acute  osseous abnormality. Review of the MIP images confirms the above findings. IMPRESSION: 1. Negative for pulmonary embolism. 2. Mild enlargement of the main pulmonary artery, which can be seen in the setting of pulmonary hypertension. Electronically Signed   By: Wiliam Ke M.D.   On: 03/15/2022 13:41   ? ?Procedures ?Procedures  ? ? ?Medications Ordered in ED ?Medications  ?nitroGLYCERIN (NITROSTAT) SL tablet 0.4 mg (0.4 mg Sublingual Given 03/15/22 1238)  ?aspirin chewable tablet 324 mg (324 mg Oral Given 03/15/22 1236)  ?iohexol (OMNIPAQUE) 350 MG/ML injection 100 mL (100 mLs Intravenous Contrast Given 03/15/22 1333)  ? ? ?ED Course/ Medical Decision Making/ A&P ?  ?                        ?Medical Decision Making ?46 year old female presents to the ED today with complaint of left-sided chest pressure with radiation into the left arm, shortness of breath, lightheadedness that began while taking a shower this morning.  On arrival to the ED patient's  vitals were stable however she was moaning in pain.  Work-up was started including EKG, chest x-ray, labs - troponin as well as D-dimer.  No history of DVT or PE and no risk factors for same.  Ordered aspirin and ni

## 2022-03-15 NOTE — ED Notes (Signed)
Patient requesting motrin for HA.  Admitting MD notified.  Awaiting new orders. ?

## 2022-03-16 ENCOUNTER — Observation Stay (HOSPITAL_BASED_OUTPATIENT_CLINIC_OR_DEPARTMENT_OTHER): Payer: BC Managed Care – PPO

## 2022-03-16 ENCOUNTER — Observation Stay (HOSPITAL_COMMUNITY): Payer: BC Managed Care – PPO

## 2022-03-16 ENCOUNTER — Telehealth (HOSPITAL_COMMUNITY): Payer: Self-pay | Admitting: *Deleted

## 2022-03-16 DIAGNOSIS — R079 Chest pain, unspecified: Secondary | ICD-10-CM | POA: Diagnosis not present

## 2022-03-16 DIAGNOSIS — I200 Unstable angina: Secondary | ICD-10-CM

## 2022-03-16 LAB — HIV ANTIBODY (ROUTINE TESTING W REFLEX): HIV Screen 4th Generation wRfx: NONREACTIVE

## 2022-03-16 LAB — IRON AND TIBC
Iron: 22 ug/dL — ABNORMAL LOW (ref 28–170)
Saturation Ratios: 5 % — ABNORMAL LOW (ref 10.4–31.8)
TIBC: 466 ug/dL — ABNORMAL HIGH (ref 250–450)
UIBC: 444 ug/dL

## 2022-03-16 LAB — ECHOCARDIOGRAM COMPLETE
AR max vel: 3.21 cm2
AV Peak grad: 10.2 mmHg
Ao pk vel: 1.6 m/s
Area-P 1/2: 5.16 cm2
Calc EF: 57.4 %
Height: 64 in
P 1/2 time: 592 msec
S' Lateral: 3.7 cm
Single Plane A2C EF: 57 %
Single Plane A4C EF: 56.9 %
Weight: 5030.02 oz

## 2022-03-16 LAB — COMPREHENSIVE METABOLIC PANEL
ALT: 14 U/L (ref 0–44)
AST: 14 U/L — ABNORMAL LOW (ref 15–41)
Albumin: 3.2 g/dL — ABNORMAL LOW (ref 3.5–5.0)
Alkaline Phosphatase: 45 U/L (ref 38–126)
Anion gap: 5 (ref 5–15)
BUN: 5 mg/dL — ABNORMAL LOW (ref 6–20)
CO2: 25 mmol/L (ref 22–32)
Calcium: 8.7 mg/dL — ABNORMAL LOW (ref 8.9–10.3)
Chloride: 108 mmol/L (ref 98–111)
Creatinine, Ser: 0.7 mg/dL (ref 0.44–1.00)
GFR, Estimated: >60 mL/min (ref >60)
Glucose, Bld: 94 mg/dL (ref 70–99)
Potassium: 3.5 mmol/L (ref 3.5–5.1)
Sodium: 138 mmol/L (ref 135–145)
Total Bilirubin: 0.3 mg/dL (ref 0.3–1.2)
Total Protein: 6.2 g/dL — ABNORMAL LOW (ref 6.5–8.1)

## 2022-03-16 LAB — FERRITIN: Ferritin: 5 ng/mL — ABNORMAL LOW (ref 11–307)

## 2022-03-16 MED ORDER — ACETAMINOPHEN 325 MG PO TABS: 650.0000 mg | ORAL_TABLET | Freq: Four times a day (QID) | ORAL | Status: DC | PRN

## 2022-03-16 MED ORDER — IRBESARTAN-HYDROCHLOROTHIAZIDE 150-12.5 MG PO TABS: 2.0000 | ORAL_TABLET | Freq: Every day | ORAL | 0 refills | Status: DC

## 2022-03-16 MED ORDER — METOPROLOL TARTRATE 100 MG PO TABS
100.0000 mg | ORAL_TABLET | Freq: Once | ORAL | Status: AC
  Administered 2022-03-16: 100 mg via ORAL
  Filled 2022-03-16: qty 1

## 2022-03-16 MED ORDER — SODIUM CHLORIDE 0.9 % IV SOLN
510.0000 mg | Freq: Once | INTRAVENOUS | Status: AC
  Administered 2022-03-16: 510 mg via INTRAVENOUS
  Filled 2022-03-16: qty 17

## 2022-03-16 MED ORDER — SODIUM CHLORIDE 0.9 % IV SOLN: 250.0000 mg | Freq: Once | INTRAVENOUS | Status: DC

## 2022-03-16 MED ORDER — IOHEXOL 350 MG/ML SOLN
100.0000 mL | Freq: Once | INTRAVENOUS | Status: AC | PRN
  Administered 2022-03-16: 100 mL via INTRAVENOUS

## 2022-03-16 MED ORDER — NITROGLYCERIN 0.4 MG SL SUBL
0.8000 mg | SUBLINGUAL_TABLET | Freq: Once | SUBLINGUAL | Status: AC
Start: 2022-03-16 — End: 2022-03-16

## 2022-03-16 MED ORDER — NITROGLYCERIN 0.4 MG SL SUBL
SUBLINGUAL_TABLET | SUBLINGUAL | Status: AC
Start: 2022-03-16 — End: 2022-03-16
  Administered 2022-03-16: 0.8 mg via SUBLINGUAL
  Filled 2022-03-16: qty 2

## 2022-03-16 NOTE — Progress Notes (Signed)
? ?Progress Note ? ?Patient Name: Morgan Molina ?Date of Encounter: 03/16/2022 ? ? [facility] Cardiologist: Elyse Prevo  ? ?Subjective  ? ?Morgan Molina seems to be doing well.  She is not having any further episodes of chest pain.  We will schedule her for a coronary CT angiogram for later today based on her episodes of chest discomfort. ? ? ?Heart rate is a little fast.  I will give her 100 mg metoprolol ordered now. ?Inpatient Medications  ?  ?Scheduled Meds: ? amLODipine  10 mg Oral Daily  ? aspirin EC  81 mg Oral Daily  ? heparin  5,000 Units Subcutaneous Q8H  ? irbesartan  150 mg Oral Daily  ? And  ? hydrochlorothiazide  12.5 mg Oral Daily  ? senna  1 tablet Oral BID  ? ?Continuous Infusions: ? ?PRN Meds: ?acetaminophen **OR** acetaminophen, bisacodyl, ibuprofen, nitroGLYCERIN, ondansetron **OR** ondansetron (ZOFRAN) IV, senna-docusate  ? ?Vital Signs  ?  ?Vitals:  ? 03/15/22 2034 03/16/22 0040 03/16/22 0559 03/16/22 0800  ?BP: 139/66 (!) 131/58 (!) 143/92 (!) 147/90  ?Pulse: 81 77 77 74  ?Resp: 16 17 16 18   ?Temp: 98.2 ?F (36.8 ?C) 98.7 ?F (37.1 ?C) 98.2 ?F (36.8 ?C) 98.1 ?F (36.7 ?C)  ?TempSrc: Oral Oral Oral Oral  ?SpO2: 100% 100% 100% 100%  ?Weight: (!) 142.6 kg     ?Height: 5\' 4"  (1.626 m)     ? ? ?Intake/Output Summary (Last 24 hours) at 03/16/2022 0923 ?Last data filed at 03/15/2022 2212 ?Gross per 24 hour  ?Intake 240 ml  ?Output --  ?Net 240 ml  ? ? ?  03/15/2022  ?  8:34 PM 03/15/2022  ?  9:36 AM 01/25/2022  ?  4:17 PM  ?Last 3 Weights  ?Weight (lbs) 314 lb 6 oz 309 lb 309 lb  ?Weight (kg) 142.6 kg 140.161 kg 140.161 kg  ?   ? ?Telemetry  ?  ?Sinus tachycardia- Personally Reviewed ? ?ECG  ?  ?Normal sinus rhythm- Personally Reviewed ? ?Physical Exam  ? ?GEN: Morbidly obese, young female, no acute distress ?Neck: No JVD ?Cardiac: RRR, soft systolic murmur ?Respiratory: Clear to auscultation bilaterally. ?GI: Soft, nontender, non-distended  ?MS: No edema; No deformity. ?Neuro:  Nonfocal  ?Psych: Normal affect   ? ?Labs  ?  ?High Sensitivity Troponin:   ?Recent Labs  ?Lab 03/15/22 ?[id number] 03/15/22 ?1231  ?TROPONINIHS 7 7  ?   ?Chemistry ?Recent Labs  ?Lab 03/15/22 ?[id number] 03/16/22 ?[id number]  ?NA 138 138  ?K 4.4 3.5  ?CL 108 108  ?CO2 20* 25  ?GLUCOSE 108* 94  ?BUN 8 5*  ?CREATININE 0.77 0.70  ?CALCIUM 8.9 8.7*  ?PROT  --  6.2*  ?ALBUMIN  --  3.2*  ?AST  --  14*  ?ALT  --  14  ?ALKPHOS  --  45  ?BILITOT  --  0.3  ?GFRNONAA >60 >60  ?ANIONGAP 10 5  ?  ?Lipids  ?Recent Labs  ?Lab 03/15/22 ?0944  ?CHOL 182  ?TRIG 71  ?HDL 77  ?[place] 91  ?CHOLHDL 2.4  ?  ?Hematology ?Recent Labs  ?Lab 03/15/22 ?0944  ?WBC 7.6  ?RBC 4.03  ?HGB 9.9*  ?HCT 31.8*  ?MCV 78.9*  ?MCH 24.6*  ?MCHC 31.1  ?RDW 18.6*  ?PLT 364  ? ?Thyroid No results for input(s): TSH, FREET4 in the last 168 hours.  ?BNPNo results for input(s): BNP, PROBNP in the last 168 hours.  ?DDimer  ?Recent Labs  ?Lab 03/15/22 ?0944  ?DDIMER 0.72*  ?  ? ?  Radiology  ?  ?DG Chest 2 View ? ?Result Date: 03/15/2022 ?CLINICAL DATA:  Chest pain and shortness of breath. EXAM: CHEST - 2 VIEW COMPARISON:  Chest radiograph 03/28/2021 FINDINGS: Slightly decreased lung volumes. Prominent lung markings in the lower chest could represent atelectasis. Heart size is within normal limits. The thoracic aortic arch and proximal descending thoracic aorta are slightly prominent compared to the previous examination. Negative for pneumothorax. Trachea is midline. Mild degenerative changes in the thoracic spine. IMPRESSION: 1. Slightly increased prominence of the aortic arch and proximal descending thoracic aorta on the frontal view. This could be related to the projection of the image but cannot exclude pathology in this area. Recommend further characterization with a chest CT with IV contrast. 2. Slightly decreased lung volumes without focal lung disease. Electronically Signed   By: Markus Daft M.D.   On: 03/15/2022 10:01  ? ?CT Angio Chest PE W/Cm &/Or Wo Cm ? ?Result Date: 03/15/2022 ?CLINICAL DATA:  Left upper  chest pain, radiates to left arm and back EXAM: CT ANGIOGRAPHY CHEST WITH CONTRAST TECHNIQUE: Multidetector CT imaging of the chest was performed using the standard protocol during bolus administration of intravenous contrast. Multiplanar CT image reconstructions and MIPs were obtained to evaluate the vascular anatomy. RADIATION DOSE REDUCTION: This exam was performed according to the departmental dose-optimization program which includes automated exposure control, adjustment of the mA and/or kV according to patient size and/or use of iterative reconstruction technique. CONTRAST:  157mL OMNIPAQUE IOHEXOL 350 MG/ML SOLN COMPARISON:  None Available. FINDINGS: Cardiovascular: Satisfactory opacification of the pulmonary arteries to the segmental level. No evidence of pulmonary embolism. Enlargement of the main pulmonary artery, which measures up to 3.0 cm, just above the upper limit of normal. The heart is mildly enlarged. No pericardial effusion. No reflux of contrast into the hepatic veins. Mediastinum/Nodes: No enlarged mediastinal, hilar, or axillary lymph nodes. Thyroid gland, trachea, and esophagus demonstrate no significant findings. Lungs/Pleura: No focal pulmonary opacity or pleural effusion. Upper Abdomen: No acute abnormality. Musculoskeletal: No acute osseous abnormality. Review of the MIP images confirms the above findings. IMPRESSION: 1. Negative for pulmonary embolism. 2. Mild enlargement of the main pulmonary artery, which can be seen in the setting of pulmonary hypertension. Electronically Signed   By: Merilyn Baba M.D.   On: 03/15/2022 13:41   ? ?Cardiac Studies  ? ? ?Patient Profile  ?   ?46 y.o. female admitted with chest discomfort.  She has a history of hypertension, presyncope and diabetes.  She also has a history of ? ?Assessment & Plan  ?  ?1.  Chest pain: Cardiac enzymes are negative.  Her EKG is unremarkable.  She is scheduled for coronary CT angiogram today for further evaluation of her  coronary artery disease. ? ?If her coronary CT angiogram is unremarkable and her echocardiogram is basically unremarkable she would be okay for discharge later today. ? ?2.  Episodes of presyncope: I suspect this is due to anxiety.  It tends to occur during times of stress.  She does have a murmur so we will check an echocardiogram. ? ?3.  Morbid obesity: She states that she works out 3-4 times a week.  She may benefit from a nutrition consult. ? ?4.  Diabetes: Further management and evaluation per primary medical team. ?   ? ?For questions or updates, please contact [place] ?Please consult [email or web address] for contact info under  ? ?  ?   ?Signed, ?Mertie Moores, MD  ?03/16/2022, 9:23 AM   ? ?

## 2022-03-16 NOTE — Telephone Encounter (Signed)
Entered in error

## 2022-03-16 NOTE — Discharge Instructions (Addendum)
It was great pleasure taking care of you!   ? ?You presented to the ED with chest pain and worsening shortness of breath.  Lab work that was done did not show any damage to your heart or any signs that you had a heart attack.  Cardiology was consulted and they performed multiple test that did not heart disease as underlying cause for the chest pain.  The chest pain and shortness of breath improved and you are stable for discharge. The chest pain could be due to multiple reasons including anxiety, and low blood level also called anemia.  ? ?Your blood pressure in the ED was very high and we want you to start taking a blood pressure medication. Your blood level was low and you had significant iron deficiency, so we gave you iron through the IV. Please follow up with your PCP and have another dose of IV iron given as outpatient. I would recommend you start taking an iron supplement. I would recommend taking Vitron C to minimize the side effects. I would recommend close follow up with your PCP, with an appointment in 7-10 after discharge for a hospital follow up.  ? ? ? ? ? ?

## 2022-03-16 NOTE — Progress Notes (Addendum)
? ? ?  Subjective:  ?Overnight:NAEON ? ?Patient states she is doing well. Her chest pain has resolved. Denies any acute concerns. Had concerns regarding medications and they were addressed.  ? ?Objective: ? ?Vital signs in last 24 hours: Hypertensive overnights with BP 130s-140s/60-90s. Afebrile and satting well on RA.  ?Vitals:  ? 03/15/22 1930 03/15/22 2034 03/16/22 0040 03/16/22 0559  ?BP: 126/68 139/66 (!) 131/58 (!) 143/92  ?Pulse: 70 81 77 77  ?Resp: 13 16 17 16   ?Temp:  98.2 ?F (36.8 ?C) 98.7 ?F (37.1 ?C) 98.2 ?F (36.8 ?C)  ?TempSrc:  Oral Oral Oral  ?SpO2: 100% 100% 100% 100%  ?Weight:  (!) 142.6 kg    ?Height:  5\' 4"  (1.626 m)    ? ?Supplemental O2: Room Air ?SpO2: 100 % ?Filed Weights  ? 03/15/22 0936 03/15/22 2034  ?Weight: (!) 140.2 kg (!) 142.6 kg  ? ? ?Intake/Output Summary (Last 24 hours) at 03/16/2022 0703 ?Last data filed at 03/15/2022 2212 ?Gross per 24 hour  ?Intake 240 ml  ?Output --  ?Net 240 ml  ? ?Net IO Since Admission: 240 mL [03/16/22 0703] ?Physical Exam ?General: NAD ?Lungs: CTAB  ?Cardiovascular: NSR, 2+ pulses ?Abdomen: No TTP, normal bowel sounds ?MSK: no asymmetry, normal muscle bulk and tone ?Skin: no lesions ?Neuro: alert and oriented x4 ?Psych: normal mood and normal affect ? ?Diagnostics ?CMP shows Na 138, K at 3.5, Ca 8.7, TP 6.2, Alb 3.2, AST 14. ?Iron 22, Sat 5 %. Ferritin 5. ?Lipid panel with cholesterol 182, LDL 91. ? ?Assessment/Plan: ?Patient is a 46 year old female past medical history of hypertension, anxiety, PTSD and significant cardiac family history presenting to the ED with acute onset worsening chest pain with lab work showing normal troponin level and imaging showing lack of PE or aortic dissection with concern present for unstable angina. ?  ? ?Principal Problem: ?  Unstable angina (HCC) ? ?Noncardiac chest pain ?ACS ruled out. CT coronary calcium score of 0. Echocardiogram is unremarkable. Chest pain and shortness of breath has resolved. Possibly symptomatic  hypertension vs. symptomatic anemia - see below.  ?  ?Chronic anemia ?Menorrhagia ?Iron studies show iron at 11, Tsat at 5 % and ferritin at 5. This may be contributing to her symptoms of dizziness and her chest pain.  ?-Start IV iron. Ferra-heme started.  ?-Outpatient gynecology follow up  ?-Continue monitor on repeat blood work ? ?Uncontrolled hypertension ?Chronic. Restarted amlodipine and irbesartan-hctz. Patient states she does not want amlodipine due to LE swelling but agreeable to irbesartan-hctz.  ?-Discontinue amlodipine and continue irbesartan-hctz  ?-Continue to monitor her blood pressure ?-Outpatient adjustment of her antihypertensive regimen.  ?  ?Anxiety ?Depression ?Chronic.  Patient refused starting any medications stating she is coping well with her anxiety and depression.  ?-Continue to monitor ?  ?Recurrent syncope ?Dizziness ?Most likely 2/2 to her anemia and htn. Will give IV iron and control bp as above. ?-Monitor for improvement and consider further work-up outpatient ? ?DVT prophx: SCDs  ?Diet: HH diet ?Bowel: PRN ?Code:Full ?  ?Prior to Admission Living Arrangement: Home ?Anticipated Discharge Location: Home ?Barriers to Discharge: Medical Workup ? ?Dispo: Anticipated discharge in approximately 1 day(s).  ? ?05/16/22, MD ?[facility] ?Internal Medicine Residency, PGY-1  ?Pager: [phone] ?After 5 pm and on weekends: Please call the on-call pager  ?

## 2022-03-17 ENCOUNTER — Telehealth: Payer: Self-pay

## 2022-03-17 DIAGNOSIS — R079 Chest pain, unspecified: Secondary | ICD-10-CM

## 2022-03-17 NOTE — Telephone Encounter (Signed)
Transition Care Management Follow-up Telephone Call ?  ?Date of discharge and from where:[facility] on 03/16/2022 ?How have you been since you were released from the hospital? Much better  ?Any questions or concerns? No questions/concerns reported.  ?Items Reviewed: ?Did the pt receive and understand the discharge instructions provided? Has the instructions and have no questions.  ?Medications obtained and verified? She said that she have the medication list  and the hospital staff reviewed them in detail prior to discharge. She said that she has all of the medications and they have no questions.  ?Any new allergies since your discharge? None reported  ?Do you have support at home? Yes, spouse ?Other (ie: DME, Home Health, etc)      ?  ?Functional Questionnaire: (I = Independent and D = Dependent) ?ADL's:  Independent.   N/A ?  ?  ?Follow up appointments reviewed: ?  ??PCP Hospital f/u appt confirmed? NP [facility] on 03/29/2022 ??Specialist Hospital f/u appt confirmed? None scheduled at this time  ??Are transportation arrangements needed? have transportation ??If their condition worsens, is the pt aware to call  their PCP or go to the ED? yes ??Was the patient provided with contact information for the PCP's office or ED? She has the phone number  ??Was the pt encouraged to call back with questions or concerns?yes ?

## 2022-03-17 NOTE — Discharge Summary (Addendum)
? ?Name: Morgan Molina ?MRN: [id number] ?DOB: 1975/12/15 46 y.o. ?PCP: Kerin Perna, NP ? ?Date of Admission: 03/15/2022  9:29 AM ?Date of Discharge: 03/16/2022  5:41 PM ?Attending Physician: Velna Ochs M.D. ? ?Discharge Diagnosis: ?Active Problems: ?  Shortness of breath ?  Chest pain ?Uncontrolled Hypertension ?Anxiety  ?Depression ?Chronic Anemia  ?Menorrhagia ?Dizziness ? ?Discharge Medications: ?Allergies as of 03/16/2022   ? ?   Reactions  ? Latex Dermatitis  ? Lisinopril Cough  ? Adhesive [tape] Rash  ? ?  ? ?  ?Medication List  ?  ? ?STOP taking these medications   ? ?amLODipine 10 MG tablet ?Commonly known as: NORVASC ?  ? ?  ? ?TAKE these medications   ? ?acetaminophen 325 MG tablet ?Commonly known as: TYLENOL ?Take 2 tablets (650 mg total) by mouth every 6 (six) hours as needed for mild pain (or Fever >/= 101). ?  ?alclomethasone 0.05 % cream ?Commonly known as: ACLOVATE ?Apply topically 2 (two) times daily as needed (Rash). ?  ?ibuprofen 200 MG tablet ?Commonly known as: ADVIL ?Take 200 mg by mouth every 6 (six) hours as needed for mild pain. ?  ?irbesartan-hydrochlorothiazide 150-12.5 MG tablet ?Commonly known as: Avalide ?Take 2 tablets by mouth daily. ?What changed: how much to take ?  ?triamcinolone cream 0.1 % ?Commonly known as: KENALOG ?Apply 1 application topically daily as needed. Not on face or folds ?  ? ?  ? ? ?Disposition and follow-up:   ?Ms.[name] was discharged from [facility] in Stable condition.  At the hospital follow up visit please address: ? ?Chest pain: ensure it has resolved and no further episodes ?Hypertension: Patient started on irbestartan-hctz combo pill. Titrate as necessary for good bp control.  ?Anemia: Give IV iron to replete iron level if appropriate and start on oral iron supplement.  ?Vaginal Bleeding: Ensure patient has follow up with OB/Gyn for her menorrhagia.  ? ?2.  Labs / imaging needed at time of follow-up: CBC, iron  panel ? ?3.  Pending labs/ test needing follow-up: None ? ?Follow-up Appointments: ? Follow-up Information   ? ? Kerin Perna, NP. Call today.   ?Specialty: Internal Medicine ?Why: Call to make a hospital follow up appointment ?Contact information: ?[address] ?[place] [address] ?[phone] ? ? ?  ?  ? ?  ?  ? ?  ? ? ?Hospital Course by problem list: ?HPI per Dr. Humphrey Rolls ?"Patient is a 46 year old female with past medical history of hypertension, anxiety, depression, and PTSD presented to the ED with chest pain.  Patient states chest pain started suddenly in the morning and did not go away requiring her to come to the ED.  Arrived the chest pain as sharp and dull sensation in the center of her chest.  She also endorsed shortness of breath and numbness and tingling in her left arm.  She also endorsed significant weakness in her left arm that has since resolved.  She denied any radiation to her jaw or any diaphoresis.  She is denying any cold like symptoms.  She stated the pain does radiate to her back and shoulders.  She had taken ibuprofen but that did not relieve her pain.  Pain improved after arrival to the ED but has remained 3-4 out of 10 in severity from her 10 out of 10 in severity. ?  ?She states she has history of hypertension but is not taking any medications.  It has been a 2 weeks  that she checked her blood pressure and uses a wrist monitor and reported a normal reading at that time.  She is focusing on diet, exercise and meditation to control all her medical additions.  She states she has panic attacks previously but was unable to tell when the last one occurred.  She stated it did not occur this year and this episode did not feel like her previous panic attacks.  She endorses moderate stress and states it is well managed.  She reports working out 3 times a week with focus on cardio and light weight training. ?  ?She reports dizziness that has been occurring for the past 2 weeks but has  not had a syncopal episode.  She states her previous syncopal episodes have been attributed to vasovagal syndrome.  ?Discharge Subjective: Patient states she is doing well. Her chest pain has resolved. Denies any acute concerns. Had concerns regarding medications and they were addressed." ? ? ?Noncardiac chest pain ?Shortness of breath ?Patient presented with acute onset chest pain and shortness of breath. Ddx included MI vs PE vs Aortic Dissection vs acute decompensated pulmonary hypertension vs anxiety attack. Given her symptoms, MI and PE were considered but ruled out with negative troponin and a negative CTA.  Her symptoms were atypical for MI.  Aortic dissection was considered given the radiation to the back ruled out with imaging.  Anxiety attack was also likely given her history of anxiety and anxiety attacks but is less concerning compared to underlying cardiac pathology. Cardiology was consulted by EDP and they recommended further work-up due to her significant family history. Further cardiac workup resulted in ACS being ruled out. CT coronary calcium score of 0. Echocardiogram was unremarkable. Chest pain and shortness of breath resolved. Her symptoms were most likely due to symptomatic hypertension vs. symptomatic anemia - see below.  ?  ?Chronic anemia ?Menorrhagia ?Dizziness ?Patient hemoglobin was 9.9 from 105 baseline. Iron studies show iron at 11, Tsat at 5 % and ferritin at 5. This appeared to be contributing to her may be contributing to her symptoms of dizziness and her chest pain. She was given 250 mg of Ferra-heme. Recommend another dose of IV iron and then oral iron supplement. Her menorrhagia symptoms appear consistent with peri-menopause but she will benefit from further gynecology work up. She stated she had called for an appointment but has not heard back yet.  ? ?Uncontrolled hypertension ?Chronic. Restarted amlodipine and irbesartan-hctz. Patient stated she does not want amlodipine due to  LE swelling but agreeable to irbesartan-hctz. So the amlodipine was discontinued and she was discharged with irbesartan-hctz 300-25 mg.  ? ?Anxiety ?Depression ?Chronic issue for patient.  Patient refused starting any medications stating she is coping well with her anxiety and depression. She stated she dislikes medicines in general and wants to minimize their use.  ?  ?Discharge Exam:   ?BP (!) 147/104   Pulse 75   Temp 98.1 ?F (36.7 ?C) (Oral)   Resp 18   Ht 5\' 4"  (1.626 m)   Wt (!) 142.6 kg   LMP 02/13/2022 Comment: pt. stated, I bleed for 30 days.  SpO2 100%   BMI 53.96 kg/m?  ? ?Physical Exam:  ?General: NAD ?Lungs: CTAB  ?Cardiovascular: NSR, 2+ pulses ?Abdomen: No TTP, normal bowel sounds ?MSK: no asymmetry, normal muscle bulk and tone ?Skin: no lesions ?Neuro: alert and oriented x4 ?Psych: normal mood and normal affect ? ?Pertinent Labs, Studies, and Procedures:  ? ?  Latest Ref Rng &  Units 03/15/2022  ?  9:44 AM 02/14/2021  ?  3:42 PM 11/25/2020  ?  5:40 PM  ?CBC  ?WBC 4.0 - 10.5 K/uL 7.6   6.8   6.9    ?Hemoglobin 12.0 - 15.0 g/dL 9.9   10.4   10.6    ?Hematocrit 36.0 - 46.0 % 31.8   33.4   34.1    ?Platelets 150 - 400 K/uL 364   359   367    ?  ? ?  Latest Ref Rng & Units 03/16/2022  ?  2:49 AM 03/15/2022  ?  9:44 AM 08/26/2021  ?  2:30 PM  ?CMP  ?Glucose 70 - 99 mg/dL 94   108   95    ?BUN 6 - 20 mg/dL 5   8   9     ?Creatinine 0.44 - 1.00 mg/dL 0.70   0.77   0.72    ?Sodium 135 - 145 mmol/L 138   138   136    ?Potassium 3.5 - 5.1 mmol/L 3.5   4.4   3.7    ?Chloride 98 - 111 mmol/L 108   108   104    ?CO2 22 - 32 mmol/L 25   20   25     ?Calcium 8.9 - 10.3 mg/dL 8.7   8.9   9.3    ?Total Protein 6.5 - 8.1 g/dL 6.2      ?Total Bilirubin 0.3 - 1.2 mg/dL 0.3      ?Alkaline Phos 38 - 126 U/L 45      ?AST 15 - 41 U/L 14      ?ALT 0 - 44 U/L 14      ?  ?Lipid Panel  ?   ?Component Value Date/Time  ? CHOL 182 03/15/2022 0944  ? CHOL 171 03/22/2020 1044  ? TRIG 71 03/15/2022 0944  ? HDL 77 03/15/2022 0944  ?  HDL 70 03/22/2020 1044  ? CHOLHDL 2.4 03/15/2022 0944  ? VLDL 14 03/15/2022 0944  ? [place] 91 03/15/2022 0944  ? [place] 87 03/22/2020 1044  ? LABVLDL 14 03/22/2020 1044  ?  ?Iron/TIBC/Ferritin/ %Sat ?   ?Compone

## 2022-03-29 ENCOUNTER — Ambulatory Visit (INDEPENDENT_AMBULATORY_CARE_PROVIDER_SITE_OTHER): Payer: BC Managed Care – PPO | Admitting: Primary Care

## 2022-03-29 ENCOUNTER — Encounter (INDEPENDENT_AMBULATORY_CARE_PROVIDER_SITE_OTHER): Payer: Self-pay | Admitting: Primary Care

## 2022-03-29 VITALS — BP 140/87 | HR 63 | Temp 98.1°F | Ht 64.0 in | Wt 313.8 lb

## 2022-03-29 DIAGNOSIS — F419 Anxiety disorder, unspecified: Secondary | ICD-10-CM

## 2022-03-29 DIAGNOSIS — F32A Depression, unspecified: Secondary | ICD-10-CM

## 2022-03-29 DIAGNOSIS — I10 Essential (primary) hypertension: Secondary | ICD-10-CM | POA: Diagnosis not present

## 2022-03-29 MED ORDER — BUSPIRONE HCL 10 MG PO TABS: 10.0000 mg | ORAL_TABLET | Freq: Two times a day (BID) | ORAL | 1 refills | Status: DC

## 2022-03-29 NOTE — Progress Notes (Signed)
Taking metoprolol that was given to her during a previous hospital. Irbesartan caused pain

## 2022-03-29 NOTE — Patient Instructions (Signed)
Hypertension, Adult ?Hypertension is another name for high blood pressure. High blood pressure forces your heart to work harder to pump blood. This can cause problems over time. ?There are two numbers in a blood pressure reading. There is a top number (systolic) over a bottom number (diastolic). It is best to have a blood pressure that is below 120/80. ?What are the causes? ?The cause of this condition is not known. Some other conditions can lead to high blood pressure. ?What increases the risk? ?Some lifestyle factors can make you more likely to develop high blood pressure: ?Smoking. ?Not getting enough exercise or physical activity. ?Being overweight. ?Having too much fat, sugar, calories, or salt (sodium) in your diet. ?Drinking too much alcohol. ?Other risk factors include: ?Having any of these conditions: ?Heart disease. ?Diabetes. ?High cholesterol. ?Kidney disease. ?Obstructive sleep apnea. ?Having a family history of high blood pressure and high cholesterol. ?Age. The risk increases with age. ?Stress. ?What are the signs or symptoms? ?High blood pressure may not cause symptoms. Very high blood pressure (hypertensive crisis) may cause: ?Headache. ?Fast or uneven heartbeats (palpitations). ?Shortness of breath. ?Nosebleed. ?Vomiting or feeling like you may vomit (nauseous). ?Changes in how you see. ?Very bad chest pain. ?Feeling dizzy. ?Seizures. ?How is this treated? ?This condition is treated by making healthy lifestyle changes, such as: ?Eating healthy foods. ?Exercising more. ?Drinking less alcohol. ?Your doctor may prescribe medicine if lifestyle changes do not help enough and if: ?Your top number is above 130. ?Your bottom number is above 80. ?Your personal target blood pressure may vary. ?Follow these instructions at home: ?Eating and drinking ? ?If told, follow the DASH eating plan. To follow this plan: ?Fill one half of your plate at each meal with fruits and vegetables. ?Fill one fourth of your plate  at each meal with whole grains. Whole grains include whole-wheat pasta, brown rice, and whole-grain bread. ?Eat or drink low-fat dairy products, such as skim milk or low-fat yogurt. ?Fill one fourth of your plate at each meal with low-fat (lean) proteins. Low-fat proteins include fish, chicken without skin, eggs, beans, and tofu. ?Avoid fatty meat, cured and processed meat, or chicken with skin. ?Avoid pre-made or processed food. ?Limit the amount of salt in your diet to less than 1,500 mg each day. ?Do not drink alcohol if: ?Your doctor tells you not to drink. ?You are pregnant, may be pregnant, or are planning to become pregnant. ?If you drink alcohol: ?Limit how much you have to: ?0-1 drink a day for women. ?0-2 drinks a day for men. ?Know how much alcohol is in your drink. In the [place] one drink equals one 12 oz bottle of beer (355 mL), one 5 oz glass of wine (148 mL), or one 1? oz glass of hard liquor (44 mL). ?Lifestyle ? ?Work with your doctor to stay at a healthy weight or to lose weight. Ask your doctor what the best weight is for you. ?Get at least 30 minutes of exercise that causes your heart to beat faster (aerobic exercise) most days of the week. This may include walking, swimming, or biking. ?Get at least 30 minutes of exercise that strengthens your muscles (resistance exercise) at least 3 days a week. This may include lifting weights or doing Pilates. ?Do not smoke or use any products that contain nicotine or tobacco. If you need help quitting, ask your doctor. ?Check your blood pressure at home as told by your doctor. ?Keep all follow-up visits. ?Medicines ?Take over-the-counter and prescription medicines   only as told by your doctor. Follow directions carefully. ?Do not skip doses of blood pressure medicine. The medicine does not work as well if you skip doses. Skipping doses also puts you at risk for problems. ?Ask your doctor about side effects or reactions to medicines that you should watch  for. ?Contact a doctor if: ?You think you are having a reaction to the medicine you are taking. ?You have headaches that keep coming back. ?You feel dizzy. ?You have swelling in your ankles. ?You have trouble with your vision. ?Get help right away if: ?You get a very bad headache. ?You start to feel mixed up (confused). ?You feel weak or numb. ?You feel faint. ?You have very bad pain in your: ?Chest. ?Belly (abdomen). ?You vomit more than once. ?You have trouble breathing. ?These symptoms may be an emergency. Get help right away. Call 911. ?Do not wait to see if the symptoms will go away. ?Do not drive yourself to the hospital. ?Summary ?Hypertension is another name for high blood pressure. ?High blood pressure forces your heart to work harder to pump blood. ?For most people, a normal blood pressure is less than 120/80. ?Making healthy choices can help lower blood pressure. If your blood pressure does not get lower with healthy choices, you may need to take medicine. ?This information is not intended to replace advice given to you by your health care provider. Make sure you discuss any questions you have with your health care provider. ?Document Revised: 08/11/2021 Document Reviewed: 08/11/2021 ?Elsevier Patient Education ? 2023 Elsevier Inc[employer] ? ?
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
## 2022-03-30 ENCOUNTER — Telehealth (INDEPENDENT_AMBULATORY_CARE_PROVIDER_SITE_OTHER): Payer: Self-pay

## 2022-03-30 NOTE — Telephone Encounter (Signed)
Copied from CRM [id number]. Topic: General - Other >> Mar 30, 2022  9:33 AM Aretta Nip wrote: Reason for CRM: Pt has called in and states that [facility] left her a message yesterday to call her. I see that she had an appt yesterday, but she states this message was in addition to the appt. She states when the dr gets free she can return the call but she also works and dr can just leave a message and she will return the dr's call when she is free. [phone]

## 2022-03-31 NOTE — Telephone Encounter (Signed)
Pt stated Marcelino Duster asked her to call her back, and she has not received a call back yet. Pt was very upset, requesting to speak with someone in the office. While sending her to the office, the patient disconnected.  Pt was requesting to speak with PCP as soon as possible.

## 2022-04-04 MED ORDER — ISOSORBIDE DINITRATE 20 MG PO TABS: 20.0000 mg | ORAL_TABLET | Freq: Three times a day (TID) | ORAL | 0 refills | Status: DC

## 2022-04-04 MED ORDER — HYDRALAZINE HCL 25 MG PO TABS: 25.0000 mg | ORAL_TABLET | Freq: Three times a day (TID) | ORAL | 0 refills | Status: DC

## 2022-04-04 NOTE — Telephone Encounter (Signed)
Spoke with patient several concerns Bp medication sent - consulted with cardiology due to allergies ACE/ARBS/CCB. Seen by a therapist and suggest something for anxiety and depression .Prescribed Buspar- makes her sleepy takes at night. Today informs PCP AUB came back advised to call GYN immediately - abnormal labs due to blood loss ANEMIA

## 2022-04-13 ENCOUNTER — Ambulatory Visit: Payer: BC Managed Care – PPO | Admitting: Nurse Practitioner

## 2022-04-27 ENCOUNTER — Ambulatory Visit (INDEPENDENT_AMBULATORY_CARE_PROVIDER_SITE_OTHER): Payer: BC Managed Care – PPO | Admitting: Primary Care

## 2022-04-27 ENCOUNTER — Encounter (INDEPENDENT_AMBULATORY_CARE_PROVIDER_SITE_OTHER): Payer: Self-pay | Admitting: Primary Care

## 2022-04-27 VITALS — BP 138/83 | HR 82 | Temp 98.2°F | Ht 64.0 in | Wt 313.8 lb

## 2022-04-27 DIAGNOSIS — F419 Anxiety disorder, unspecified: Secondary | ICD-10-CM | POA: Diagnosis not present

## 2022-04-27 DIAGNOSIS — F32A Depression, unspecified: Secondary | ICD-10-CM

## 2022-04-27 DIAGNOSIS — I10 Essential (primary) hypertension: Secondary | ICD-10-CM | POA: Diagnosis not present

## 2022-04-27 MED ORDER — CLONAZEPAM 1 MG PO TABS: 1.0000 mg | ORAL_TABLET | Freq: Two times a day (BID) | ORAL | 1 refills | Status: DC | PRN

## 2022-04-27 MED ORDER — HYDRALAZINE HCL 50 MG PO TABS: 50.0000 mg | ORAL_TABLET | Freq: Three times a day (TID) | ORAL | 1 refills | Status: DC

## 2022-04-27 MED ORDER — ISOSORBIDE DINITRATE 30 MG PO TABS: 30.0000 mg | ORAL_TABLET | Freq: Two times a day (BID) | ORAL | 1 refills | Status: DC

## 2022-04-27 NOTE — Progress Notes (Unsigned)
[facility]   Morgan Molina is a 46 y.o. female presents for hypertension evaluation,. Patient has medication delivered from [facility] shipping company states shipped -going back and forth still no medication. Asked did her did she call the police patient stated No. I asked her if I could call non emergency police department - patient agreed. She is also, followed by therapist for PTSD, depression and anxiety but unable to prescribe medication. Ask patient to reach out to therapist send PCP what she needs/ or feels works best for her and PCP would prescribed medication- See scan document. Denies shortness of breath, headaches, chest pain or lower extremity edema, sudden onset, vision changes, unilateral weakness, dizziness, paresthesias.   Patient reports adherence with medications.  Dietary habits include: monitoring foods, sodium content  Exercise habits include:walking  Family / Social history:    Past Medical History:  Diagnosis Date   Anxiety    Depression    Hypertension    Muscle spasm    PTSD (post-traumatic stress disorder) 09/28/2019   Syncope    Past Surgical History:  Procedure Laterality Date   BREAST EXCISIONAL BIOPSY Right    CARPAL TUNNEL RELEASE Left 02/20/2020   Procedure: LEFT CARPAL TUNNEL RELEASE;  Surgeon: Betha Loa, MD;  Location: [facility];  Service: Orthopedics;  Laterality: Left;   CARPAL TUNNEL RELEASE Right 02/28/2021   Procedure: CARPAL TUNNEL RELEASE;  Surgeon: Betha Loa, MD;  Location: [facility];  Service: Orthopedics;  Laterality: Right;   CHOLECYSTECTOMY     GANGLION CYST EXCISION Left 02/20/2020   Procedure: LEFT DORSAL GANGLION EXCISION;  Surgeon: Betha Loa, MD;  Location: [facility];  Service: Orthopedics;  Laterality: Left;   TRIGGER FINGER RELEASE Bilateral 08/29/2021   Procedure: RELEASE TRIGGER FINGER/A-1 PULLEY BILATERAL THUMBS;  Surgeon: Betha Loa, MD;  Location: [facility];  Service: Orthopedics;  Laterality: Bilateral;  30 MIN   TUBAL LIGATION     Allergies  Allergen Reactions   Latex Dermatitis   Lisinopril Cough   Adhesive [Tape] Rash   Current Outpatient Medications on File Prior to Visit  Medication Sig Dispense Refill   acetaminophen (TYLENOL) 325 MG tablet Take 2 tablets (650 mg total) by mouth every 6 (six) hours as needed for mild pain (or Fever >/= 101). (Patient not taking: Reported on 04/27/2022)     busPIRone (BUSPAR) 10 MG tablet Take 1 tablet (10 mg total) by mouth 2 (two) times daily. (Patient not taking: Reported on 04/27/2022) 180 tablet 1   hydrALAZINE (APRESOLINE) 25 MG tablet Take 1 tablet (25 mg total) by mouth 3 (three) times daily. (Patient not taking: Reported on 04/27/2022) 90 tablet 0   ibuprofen (ADVIL) 200 MG tablet Take 200 mg by mouth every 6 (six) hours as needed for mild pain. (Patient not taking: Reported on 04/27/2022)     isosorbide dinitrate (ISORDIL) 20 MG tablet Take 1 tablet (20 mg total) by mouth 3 (three) times daily. (Patient not taking: Reported on 04/27/2022) 90 tablet 0   No current facility-administered medications on file prior to visit.   Social History   Socioeconomic History   Marital status: Married    Spouse name: Not on file   Number of children: 5   Years of education: some college   Highest education level: Not on file  Occupational History   Occupation: Customer Service  Tobacco Use   Smoking status: Former    Packs/day: 0.10    Types: Cigarettes  Quit date: 10/26/2019    Years since quitting: 2.5   Smokeless tobacco: Never  Vaping Use   Vaping Use: Never used  Substance and Sexual Activity   Alcohol use: Yes    Comment: occasional   Drug use: No   Sexual activity: Not on file  Other Topics Concern   Not on file  Social History Narrative   Lives at home with family.   Right-handed.   No caffeine.   Social Determinants of Health   Financial Resource Strain: Not on  file  Food Insecurity: Not on file  Transportation Needs: Not on file  Physical Activity: Not on file  Stress: Not on file  Social Connections: Not on file  Intimate Partner Violence: Not on file   Family History  Problem Relation Age of Onset   Cancer Mother    Hypertension Mother    Heart attack Mother    Breast cancer Mother 85   CAD Father    Hypertension Father    Diabetes Father      OBJECTIVE:  Vitals:   04/27/22 1611 04/27/22 1623  BP: (!) 148/90 138/83  Pulse: 87 82  Temp: 98.2 F (36.8 C)   TempSrc: Oral   SpO2: 97%   Weight: (!) 313 lb 12.8 oz (142.3 kg)   Height: 5\' 4"  (1.626 m)     Physical Exam Vitals reviewed.  Constitutional:      Appearance: She is obese.  HENT:     Head: Normocephalic.     Right Ear: External ear normal.     Left Ear: External ear normal.     Nose: Nose normal.  Eyes:     Extraocular Movements: Extraocular movements intact.     Pupils: Pupils are equal, round, and reactive to light.  Cardiovascular:     Rate and Rhythm: Normal rate and regular rhythm.  Pulmonary:     Effort: Pulmonary effort is normal.     Breath sounds: Normal breath sounds.  Abdominal:     General: Bowel sounds are normal. There is distension.     Palpations: Abdomen is soft.  Musculoskeletal:        General: Normal range of motion.     Cervical back: Normal range of motion and neck supple.  Skin:    General: Skin is warm and dry.  Neurological:     Mental Status: She is oriented to person, place, and time.  Psychiatric:        Mood and Affect: Mood normal.        Behavior: Behavior normal.        Thought Content: Thought content normal.        Judgment: Judgment normal.    ROS Comprehensive ROS Pertinent positive and negative noted in HPI   Last 3 Office BP readings: BP Readings from Last 3 Encounters:  04/27/22 138/83  03/29/22 140/87  03/16/22 (!) 147/104    BMET    Component Value Date/Time   NA 138 03/16/2022 0249   NA 142  02/14/2021 1542   K 3.5 03/16/2022 0249   CL 108 03/16/2022 0249   CO2 25 03/16/2022 0249   GLUCOSE 94 03/16/2022 0249   BUN 5 (L) 03/16/2022 0249   BUN 7 02/14/2021 1542   CREATININE 0.70 03/16/2022 0249   CALCIUM 8.7 (L) 03/16/2022 0249   GFRNONAA >60 03/16/2022 0249   GFRAA >60 02/17/2020 0911    Renal function: CrCl cannot be calculated (Patient's most recent lab result is older than the  maximum 21 days allowed.).  Clinical ASCVD: Yes  The 10-year ASCVD risk score (Arnett DK, et al., 2019) is: 1.8%   Values used to calculate the score:     Age: 70 years     Sex: Female     Is Non-Hispanic African American: Yes     Diabetic: No     Tobacco smoker: No     Systolic Blood Pressure: 138 mmHg     Is BP treated: Yes     HDL Cholesterol: 77 mg/dL     Total Cholesterol: 182 mg/dL  ASCVD risk factors include- [place]   ASSESSMENT & PLAN: Morgan Molina was seen today for blood pressure check.  Diagnoses and all orders for this visit:  Essential hypertension -Counseled on lifestyle modifications for blood pressure control including reduced dietary sodium, increased exercise, weight reduction and adequate sleep. Also, educated patient about the risk for cardiovascular events, stroke and heart attack. Also counseled patient about the importance of medication adherence. If you participate in smoking, it is important to stop using tobacco as this will increase the risks associated with uncontrolled blood pressure.   Goal BP:  For patients younger than 60: Goal BP < 130/80. For patients 60 and older: Goal BP < 140/90. For patients with diabetes: Goal BP < 130/80. Your most recent BP: 138/83  Minimize salt intake. Minimize alcohol intake  Anxiety and depression Letter received from therapist Clonazepam 1 mg prn  This note has been created with Education officer, environmental. Any transcriptional errors are unintentional.   Morgan Sessions,  NP 04/27/2022, 4:35 PM

## 2022-04-28 ENCOUNTER — Telehealth (INDEPENDENT_AMBULATORY_CARE_PROVIDER_SITE_OTHER): Payer: Self-pay | Admitting: Primary Care

## 2022-04-28 ENCOUNTER — Other Ambulatory Visit: Payer: Self-pay

## 2022-04-28 ENCOUNTER — Encounter (INDEPENDENT_AMBULATORY_CARE_PROVIDER_SITE_OTHER): Payer: Self-pay | Admitting: Primary Care

## 2022-04-28 ENCOUNTER — Other Ambulatory Visit (INDEPENDENT_AMBULATORY_CARE_PROVIDER_SITE_OTHER): Payer: Self-pay | Admitting: Primary Care

## 2022-04-28 DIAGNOSIS — I10 Essential (primary) hypertension: Secondary | ICD-10-CM

## 2022-04-28 DIAGNOSIS — F419 Anxiety disorder, unspecified: Secondary | ICD-10-CM

## 2022-04-28 DIAGNOSIS — F32A Depression, unspecified: Secondary | ICD-10-CM

## 2022-04-28 MED ORDER — ISOSORBIDE DINITRATE 30 MG PO TABS: 30.0000 mg | ORAL_TABLET | Freq: Two times a day (BID) | ORAL | 1 refills | Status: DC

## 2022-04-28 MED ORDER — CLONAZEPAM 1 MG PO TABS: 1.0000 mg | ORAL_TABLET | Freq: Two times a day (BID) | ORAL | 0 refills | Status: DC | PRN

## 2022-04-28 MED ORDER — HYDRALAZINE HCL 50 MG PO TABS: 50.0000 mg | ORAL_TABLET | Freq: Three times a day (TID) | ORAL | 1 refills | Status: DC

## 2022-04-29 ENCOUNTER — Other Ambulatory Visit (INDEPENDENT_AMBULATORY_CARE_PROVIDER_SITE_OTHER): Payer: Self-pay | Admitting: Primary Care

## 2022-04-29 DIAGNOSIS — I10 Essential (primary) hypertension: Secondary | ICD-10-CM

## 2022-05-01 NOTE — Telephone Encounter (Signed)
Routed to PCP 

## 2022-05-12 ENCOUNTER — Ambulatory Visit (INDEPENDENT_AMBULATORY_CARE_PROVIDER_SITE_OTHER): Payer: BC Managed Care – PPO | Admitting: Primary Care

## 2022-05-12 DIAGNOSIS — F419 Anxiety disorder, unspecified: Secondary | ICD-10-CM

## 2022-05-12 DIAGNOSIS — F32A Depression, unspecified: Secondary | ICD-10-CM | POA: Diagnosis not present

## 2022-05-12 MED ORDER — CLONAZEPAM 1 MG PO TABS: 1.0000 mg | ORAL_TABLET | Freq: Two times a day (BID) | ORAL | 1 refills | Status: DC | PRN

## 2022-05-18 ENCOUNTER — Encounter (INDEPENDENT_AMBULATORY_CARE_PROVIDER_SITE_OTHER): Payer: Self-pay | Admitting: Primary Care

## 2022-05-18 NOTE — Progress Notes (Signed)
[facility]  [name], is a 46 y.o. female  [id number]  [id number]  DOB - Mar 10, 1976  Chief Complaint  Patient presents with   Follow-up    anxiety       Subjective:   [name] is a 46 y.o. female here today for a follow up visit. Patient was asked by her therapist to recommended what medication works best for her because she (therapist unable to prescribed. ) She presents with the note stating clonazepam. Patient has No headache, No chest pain, No abdominal pain - No Nausea, No new weakness tingling or numbness, No Cough - shortness of breath  No problems updated.  Allergies  Allergen Reactions   Latex Dermatitis   Lisinopril Cough   Adhesive [Tape] Rash    Past Medical History:  Diagnosis Date   Anxiety    Depression    Hypertension    Muscle spasm    PTSD (post-traumatic stress disorder) 09/28/2019   Syncope     Current Outpatient Medications on File Prior to Visit  Medication Sig Dispense Refill   hydrALAZINE (APRESOLINE) 50 MG tablet Take 1 tablet (50 mg total) by mouth 3 (three) times daily. 90 tablet 1   isosorbide dinitrate (ISORDIL) 30 MG tablet Take 1 tablet (30 mg total) by mouth 2 (two) times daily. 90 tablet 1   acetaminophen (TYLENOL) 325 MG tablet Take 2 tablets (650 mg total) by mouth every 6 (six) hours as needed for mild pain (or Fever >/= 101). (Patient not taking: Reported on 04/27/2022)     ibuprofen (ADVIL) 200 MG tablet Take 200 mg by mouth every 6 (six) hours as needed for mild pain. (Patient not taking: Reported on 04/27/2022)     No current facility-administered medications on file prior to visit.   Comprehensive ROS Pertinent positive and negative noted in HPI   Objective:   Vitals:   05/12/22 1103  BP: 125/85  Pulse: 84  Temp: 97.9 F (36.6 C)  TempSrc: Oral  SpO2: 95%  Weight: (!) 313 lb 12.8 oz (142.3 kg)  Height: 5\' 4"  (1.626 m)    Exam General appearance : Awake, alert, not in any  distress. Speech Clear. Not toxic looking HEENT: Atraumatic and Normocephalic, pupils equally reactive to light and accomodation Neck: Supple, no JVD. No cervical lymphadenopathy.  Chest: Good air entry bilaterally, no added sounds  CVS: S1 S2 regular, no murmurs.  Abdomen: Bowel sounds present, Non tender and not distended with no gaurding, rigidity or rebound. Extremities: B/L Lower Ext shows no edema, both legs are warm to touch Neurology: Awake alert, and oriented X 3, CN II-XII intact, Non focal Skin: No Rash  Data Review Lab Results  Component Value Date   HGBA1C 5.8 (A) 01/25/2022   HGBA1C 5.9 (H) 06/16/2021   HGBA1C 6.1 (H) 03/25/2021    Assessment & Plan   1. Anxiety and depression - clonazePAM (KLONOPIN) 1 MG tablet; Take 1 tablet (1 mg total) by mouth 2 (two) times daily as needed for anxiety.  Dispense: 120 tablet; Refill: 1    Patient have been counseled extensively about nutrition and exercise. Other issues discussed during this visit include: low cholesterol diet, weight control and daily exercise, foot care, annual eye examinations at Ophthalmology, importance of adherence with medications and regular follow-up. We also discussed long term complications of uncontrolled diabetes and hypertension.   Return in about 3 months (around 08/12/2022).  The patient was given clear instructions to go to ER or return to  medical center if symptoms don't improve, worsen or new problems develop. The patient verbalized understanding. The patient was told to call to get lab results if they haven't heard anything in the next week.   This note has been created with Education officer, environmental. Any transcriptional errors are unintentional.   Grayce Sessions, NP 05/18/2022, 1:46 PM

## 2022-05-30 ENCOUNTER — Ambulatory Visit (INDEPENDENT_AMBULATORY_CARE_PROVIDER_SITE_OTHER): Payer: BC Managed Care – PPO | Admitting: Primary Care

## 2022-05-30 ENCOUNTER — Encounter (INDEPENDENT_AMBULATORY_CARE_PROVIDER_SITE_OTHER): Payer: Self-pay | Admitting: Primary Care

## 2022-05-30 VITALS — BP 124/80 | HR 78 | Temp 98.4°F | Ht 64.0 in | Wt 307.4 lb

## 2022-05-30 DIAGNOSIS — Z0130 Encounter for examination of blood pressure without abnormal findings: Secondary | ICD-10-CM

## 2022-05-30 DIAGNOSIS — M62830 Muscle spasm of back: Secondary | ICD-10-CM | POA: Diagnosis not present

## 2022-05-30 MED ORDER — MELOXICAM 7.5 MG PO TABS: 7.5000 mg | ORAL_TABLET | Freq: Every day | ORAL | 1 refills | Status: DC

## 2022-06-06 ENCOUNTER — Telehealth (INDEPENDENT_AMBULATORY_CARE_PROVIDER_SITE_OTHER): Payer: Self-pay | Admitting: Primary Care

## 2022-06-06 NOTE — Telephone Encounter (Signed)
Copied from CRM [id number]. Topic: Appointment Scheduling - Scheduling Inquiry for Clinic >> Jun 06, 2022  1:30 PM Dondra Prader E wrote: Reason for CRM: Pt called to follow up on appt that she says she has not been contacted about Best contact: [phone]

## 2022-06-07 ENCOUNTER — Telehealth (INDEPENDENT_AMBULATORY_CARE_PROVIDER_SITE_OTHER): Payer: Self-pay | Admitting: Primary Care

## 2022-06-07 ENCOUNTER — Other Ambulatory Visit (INDEPENDENT_AMBULATORY_CARE_PROVIDER_SITE_OTHER): Payer: Self-pay | Admitting: Primary Care

## 2022-06-07 DIAGNOSIS — M62830 Muscle spasm of back: Secondary | ICD-10-CM

## 2022-06-07 DIAGNOSIS — G8929 Other chronic pain: Secondary | ICD-10-CM

## 2022-06-07 NOTE — Telephone Encounter (Signed)
Copied from CRM [id number]. Topic: Appointment Scheduling - Scheduling Inquiry for Clinic >> Jun 06, 2022  1:30 PM Dondra Prader E wrote: Reason for CRM: Pt called to follow up on appt that she says she has not been contacted about Best contact: [phone] >> Jun 07, 2022  2:37 PM Ja-Kwan M wrote: Pt stated she called yesterday to request that Gwinda Passe return her call but she has yet to receive a return call. Pt requests that Marcelino Duster return her call. Cb# [phone]

## 2022-06-07 NOTE — Telephone Encounter (Signed)
Patient would like to speak with PCP about a matter that was discussed at last OV.

## 2022-06-08 ENCOUNTER — Encounter (INDEPENDENT_AMBULATORY_CARE_PROVIDER_SITE_OTHER): Payer: Self-pay | Admitting: Primary Care

## 2022-06-08 NOTE — Progress Notes (Unsigned)
Called patient explain unable to take out of work due to back and Dr. Ophelia Charter declined. She has an appt scheduled with him on 06/13/22

## 2022-06-12 NOTE — Progress Notes (Signed)
[facility]  Morgan Molina, is a 46 y.o. female  VQQ:[id number]  [id number]  DOB - [birth year]  Chief Complaint  Patient presents with   Blood Pressure Check       Subjective:   Morgan Molina is a 46 y.o. female here today for a acute visit.  She complains of having muscles spasm in her back difficult to rest comfortably at night. Patient has No headache, No chest pain, No abdominal pain - No Nausea, No new weakness tingling or numbness, No Cough - shortness of breath  No problems updated.  Allergies  Allergen Reactions   Latex Dermatitis   Lisinopril Cough   Adhesive [Tape] Rash    Past Medical History:  Diagnosis Date   Anxiety    Depression    Hypertension    Muscle spasm    PTSD (post-traumatic stress disorder) 09/28/2019   Syncope     Current Outpatient Medications on File Prior to Visit  Medication Sig Dispense Refill   clonazePAM (KLONOPIN) 1 MG tablet Take 1 tablet (1 mg total) by mouth 2 (two) times daily as needed for anxiety. 120 tablet 1   hydrALAZINE (APRESOLINE) 50 MG tablet Take 1 tablet (50 mg total) by mouth 3 (three) times daily. 90 tablet 1   isosorbide dinitrate (ISORDIL) 30 MG tablet Take 1 tablet (30 mg total) by mouth 2 (two) times daily. 90 tablet 1   acetaminophen (TYLENOL) 325 MG tablet Take 2 tablets (650 mg total) by mouth every 6 (six) hours as needed for mild pain (or Fever >/= 101). (Patient not taking: Reported on 04/27/2022)     ibuprofen (ADVIL) 200 MG tablet Take 200 mg by mouth every 6 (six) hours as needed for mild pain. (Patient not taking: Reported on 04/27/2022)     No current facility-administered medications on file prior to visit.  Comprehensive ROS Pertinent positive and negative noted in HPI    Objective:   Vitals:   05/30/22 1434  BP: 124/80  Pulse: 78  Temp: 98.4 F (36.9 C)  TempSrc: Oral  SpO2: 96%  Weight: (!) 307 lb 6.4 oz (139.4 kg)  Height: 5\' 4"  (1.626 m)    Exam General appearance  : Awake, alert, not in any distress. Speech Clear. Not toxic looking HEENT: Atraumatic and Normocephalic, pupils equally reactive to light and accomodation Neck: Supple, no JVD. No cervical lymphadenopathy.  Chest: Good air entry bilaterally, no added sounds  CVS: S1 S2 regular, no murmurs.  Abdomen: Bowel sounds present, Non tender and not distended with no gaurding, rigidity or rebound. Extremities: B/L Lower Ext shows no edema, both legs are warm to touch Neurology: Awake alert, and oriented X 3, Non focal Skin: No Rash  Data Review Lab Results  Component Value Date   HGBA1C 5.8 (A) 01/25/2022   HGBA1C 5.9 (H) 06/16/2021   HGBA1C 6.1 (H) 03/25/2021    Assessment & Plan  Morgan Molina was seen today for blood pressure check.  Diagnoses and all orders for this visit:  Muscle spasm of back -     meloxicam (MOBIC) 7.5 MG tablet; Take 1 tablet (7.5 mg total) by mouth daily.  Blood pressure check Wnl with just changes in diet and exercising     Patient have been counseled extensively about nutrition and exercise. Other issues discussed during this visit include: low cholesterol diet, weight control and daily exercise, foot care, annual eye examinations at Ophthalmology, importance of adherence with medications and regular follow-up. We also discussed long term complications of  uncontrolled diabetes and hypertension.   No follow-ups on file.  The patient was given clear instructions to go to ER or return to medical center if symptoms don't improve, worsen or new problems develop. The patient verbalized understanding. The patient was told to call to get lab results if they haven't heard anything in the next week.   This note has been created with Education officer, environmental. Any transcriptional errors are unintentional.   Grayce Sessions, NP 06/12/2022, 7:45 PM

## 2022-06-13 ENCOUNTER — Ambulatory Visit (INDEPENDENT_AMBULATORY_CARE_PROVIDER_SITE_OTHER): Payer: BC Managed Care – PPO | Admitting: Orthopaedic Surgery

## 2022-06-13 DIAGNOSIS — G8929 Other chronic pain: Secondary | ICD-10-CM

## 2022-06-13 DIAGNOSIS — Z6841 Body Mass Index (BMI) 40.0 and over, adult: Secondary | ICD-10-CM | POA: Diagnosis not present

## 2022-06-13 DIAGNOSIS — M5440 Lumbago with sciatica, unspecified side: Secondary | ICD-10-CM | POA: Diagnosis not present

## 2022-06-13 NOTE — Progress Notes (Signed)
Office Visit Note   Patient: Morgan Molina           Date of Birth: 1976-07-10           MRN: [id number] Visit Date: 06/13/2022              Requested by: Grayce Sessions, NP [address] [place],  [place] [address] PCP: Grayce Sessions, NP   Assessment & Plan: Visit Diagnoses:  1. Chronic bilateral low back pain with sciatica, sciatica laterality unspecified   2. Body mass index 50.0-59.9, adult (HCC)     Plan: We discussed weight loss to help motor back symptoms.  Referral to [facility] weight loss clinic Dr. Dalbert Garnet.  Will set up for an epidural injection with Dr. Alvester Morin. She can follow up with me after [facility]  Follow-Up Instructions: No follow-ups on file.   Orders:  No orders of the defined types were placed in this encounter.  No orders of the defined types were placed in this encounter.     Procedures: No procedures performed   Clinical Data: No additional findings.   Subjective: Chief Complaint  Patient presents with   Lower Back - Pain    HPI 46 year old female returns I last saw her 09/06/2021 with ongoing chronic low back pain symptoms.  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  She is not able to get into a comfortable position.  Pain with standing pain with walking.  She did have slight anterolisthesis at L4-5.  MRI scan showed multi factorial degenerative changes at L2-3 with moderate canal stenosis.  Right foraminal extraforaminal disc protrusion at L3-4 and L4-5 on the right consistent with there more right than left-sided symptoms.  Moderate to severe lumbar facet arthrosis most pronounced at L4-5 on the right and L5-S1 on the left which could be symptomatic.  Review of Systems All other systems have been unchanged 09/06/2021.   Objective: Vital Signs: LMP 05/29/2022 (Exact Date)   Physical Exam Constitutional:      Appearance: She is  well-developed.  HENT:     Head: Normocephalic.     Right Ear: External ear normal.     Left Ear: External ear normal. There is no impacted cerumen.  Eyes:     Pupils: Pupils are equal, round, and reactive to light.  Neck:     Thyroid: No thyromegaly.     Trachea: No tracheal deviation.  Cardiovascular:     Rate and Rhythm: Normal rate.  Pulmonary:     Effort: Pulmonary effort is normal.  Abdominal:     Palpations: Abdomen is soft.  Musculoskeletal:     Cervical back: No rigidity.  Skin:    General: Skin is warm and dry.  Neurological:     Mental Status: She is alert and oriented to person, place, and time.  Psychiatric:        Behavior: Behavior normal.     Ortho Exam patient sits pretty good in her seat continues to shift positions has problems getting from standing to sitting does not stay in 1 position.  She is able to heel and toe walk.  Some pain with straight leg raising right and left some setting tenderness pain with lumbar flexion and extension.  Negative logroll hips trochanteric bursa is tender EHL anterior tib strong.  Specialty Comments:  No specialty comments available.  Imaging: Narrative & Impression  CLINICAL DATA:  Initial evaluation for low back pain.   EXAM: MRI LUMBAR SPINE WITHOUT CONTRAST   TECHNIQUE: Multiplanar, multisequence MR imaging of the lumbar spine was performed. No intravenous contrast was administered.   COMPARISON:  Prior radiograph from 08/04/2021   FINDINGS: Segmentation: Standard. Lowest well-formed disc space labeled the L5-S1 level.   Alignment: Trace degenerative anterolisthesis of L3 on L4 and L4 on L5. Alignment otherwise normal preservation of the normal lumbar lordosis.   Vertebrae: Vertebral body height maintained without acute or chronic fracture. Bone marrow signal intensity diffusely decreased on T1 weighted imaging, nonspecific, but most commonly related to anemia, smoking, or obesity. Mild reactive endplate  change present about the L2-3 interspace. Reactive marrow edema present about the right greater than left L4-5 facets due to facet arthritis. No other abnormal marrow edema.   Conus medullaris and cauda equina: Conus extends to the L1-2 level. Conus and cauda equina appear normal.   Paraspinal and other soft tissues: Paraspinous soft tissues demonstrate no acute finding. Visualized visceral structures unremarkable.   Disc levels:   T11-12: Seen only on sagittal projection. Mild disc bulge with disc desiccation. Bilateral facet hypertrophy. No significant spinal stenosis. Moderate bilateral foraminal narrowing.   T12-L1: Normal interspace. Mild bilateral facet hypertrophy. No stenosis.   L1-2: Negative interspace. Mild bilateral facet hypertrophy. No stenosis.   L2-3: Degenerative intervertebral disc space narrowing with diffuse disc bulge and disc desiccation. Superimposed biforaminal disc protrusions, right slightly larger than left (series 6, image 15). Superimposed small left subarticular component noted on the left (series 6, image 16). Moderate bilateral facet hypertrophy. Mild prominence of the dorsal epidural fat. Resultant moderate spinal stenosis. Moderate bilateral L2 foraminal narrowing.   L3-4: Trace listhesis. Diffuse disc bulge with disc desiccation. Superimposed right foraminal disc protrusion contacts the exiting right L3 nerve root (series 6, image 19). Moderate bilateral facet hypertrophy. Resultant mild spinal stenosis. Mild left with moderate right L3 foraminal narrowing.   L4-5: Right foraminal to extraforaminal disc protrusion contacts the exiting right L4 nerve root as it courses of the right neural foramen (series 6, image 24). Severe right worse than left facet arthrosis. No significant spinal stenosis. Mild left with moderate right L4 foraminal narrowing.   L5-S1: Negative interspace. Severe left with moderate right facet arthrosis. No  significant spinal stenosis. Foramina remain patent.   IMPRESSION: 1. Multifactorial degenerative changes at L2-3 with resultant moderate canal and bilateral L2 foraminal stenosis. 2. Right foraminal to extraforaminal disc protrusions at L3-4 and L4-5, contacting and potentially affecting the exiting right L3 and L4 nerve roots respectively. 3. Moderate to severe lower lumbar facet arthrosis, most pronounced at L4-5 on the right and L5-S1 on the left. Findings could contribute to lower back pain.     Electronically Signed   By: Rise Mu M.D.   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   Ganglion cyst of dorsum of left wrist 10/01/2019   Decreased visual acuity 10/01/2019   PTSD (post-traumatic stress disorder) 09/28/2019  Nipple discharge 04/02/2019   Panic attacks 02/19/2019   Bilateral low back pain 02/19/2019   Seasonal allergies 02/19/2019   Anxiety and depression 01/14/2019   Syncope 01/14/2019   Breast mass, right 01/14/2019   Hypertension    Past Medical History:  Diagnosis Date   Anxiety    Depression    Hypertension    Muscle spasm    PTSD (post-traumatic stress disorder) 09/28/2019   Syncope     Family History  Problem Relation Age of Onset   Cancer Mother    Hypertension Mother    Heart attack Mother    Breast cancer Mother 72   CAD Father    Hypertension Father    Diabetes Father     Past Surgical History:  Procedure Laterality Date   BREAST EXCISIONAL  BIOPSY Right    CARPAL TUNNEL RELEASE Left 02/20/2020   Procedure: LEFT CARPAL TUNNEL RELEASE;  Surgeon: Betha Loa, MD;  Location: [facility];  Service: Orthopedics;  Laterality: Left;   CARPAL TUNNEL RELEASE Right 02/28/2021   Procedure: CARPAL TUNNEL RELEASE;  Surgeon: Betha Loa, MD;  Location: [facility];  Service: Orthopedics;  Laterality: Right;   CHOLECYSTECTOMY     GANGLION CYST EXCISION Left 02/20/2020   Procedure: LEFT DORSAL GANGLION EXCISION;  Surgeon: Betha Loa, MD;  Location: [facility];  Service: Orthopedics;  Laterality: Left;   TRIGGER FINGER RELEASE Bilateral 08/29/2021   Procedure: RELEASE TRIGGER FINGER/A-1 PULLEY BILATERAL THUMBS;  Surgeon: Betha Loa, MD;  Location: [facility];  Service: Orthopedics;  Laterality: Bilateral;  30 MIN   TUBAL LIGATION     Social History   Occupational History   Occupation: Clinical biochemist  Tobacco Use   Smoking status: Former    Packs/day: 0.10    Types: Cigarettes    Quit date: 10/26/2019    Years since quitting: 2.6   Smokeless tobacco: Never  Vaping Use   Vaping Use: Never used  Substance and Sexual Activity   Alcohol use: Yes    Comment: occasional   Drug use: No   Sexual activity: Not on file

## 2022-06-16 NOTE — Addendum Note (Signed)
Addended by: Rogers Seeds on: 06/16/2022 05:05 PM   Modules accepted: Orders

## 2022-06-27 ENCOUNTER — Telehealth: Payer: Self-pay | Admitting: Orthopaedic Surgery

## 2022-06-27 NOTE — Telephone Encounter (Signed)
Patient came in to drop off $25 cash payment and medical release to [name] in [place]

## 2022-06-30 ENCOUNTER — Telehealth: Payer: Self-pay | Admitting: Orthopaedic Surgery

## 2022-06-30 NOTE — Telephone Encounter (Signed)
[employer] forms received. To [employer]. 

## 2022-07-06 ENCOUNTER — Encounter: Payer: Self-pay | Admitting: Physical Medicine and Rehabilitation

## 2022-07-06 ENCOUNTER — Ambulatory Visit: Payer: Self-pay

## 2022-07-06 ENCOUNTER — Ambulatory Visit (INDEPENDENT_AMBULATORY_CARE_PROVIDER_SITE_OTHER): Payer: BC Managed Care – PPO | Admitting: Physical Medicine and Rehabilitation

## 2022-07-06 VITALS — BP 129/78 | HR 106

## 2022-07-06 DIAGNOSIS — M5416 Radiculopathy, lumbar region: Secondary | ICD-10-CM | POA: Diagnosis not present

## 2022-07-06 MED ORDER — METHYLPREDNISOLONE ACETATE 80 MG/ML IJ SUSP
80.0000 mg | Freq: Once | INTRAMUSCULAR | Status: AC
  Administered 2022-07-06: 80 mg

## 2022-07-06 NOTE — Progress Notes (Signed)
Pt state lower back pain that travels to her buttocks and down both legs and foot. Pt state walking, standing and bending makes the pain worse. Pt state she takes over the counter pain meds to help ease her pain.  Numeric Pain Rating Scale and Functional Assessment Average Pain 9   In the last MONTH (on 0-10 scale) has pain interfered with the following?  1. General activity like being  able to carry out your everyday physical activities such as walking, climbing stairs, carrying groceries, or moving a chair?  Rating(10)   +Driver, -BT, -Dye Allergies.

## 2022-07-06 NOTE — Patient Instructions (Signed)

## 2022-07-07 NOTE — Progress Notes (Signed)
Morgan Molina - 46 y.o. female MRN [id number]  Date of birth: 1975/12/16  Office Visit Note: Visit Date: 07/06/2022 PCP: Grayce Sessions, NP Referred by: Grayce Sessions, NP  Subjective: Chief Complaint  Patient presents with   Lower Back - Pain   Right Leg - Pain   Left Leg - Pain   Left Foot - Pain   Right Foot - Pain   HPI:  Morgan Molina is a 46 y.o. female who comes in today at the request of Dr. Annell Greening for planned Right L3-4 Lumbar Interlaminar epidural steroid injection with fluoroscopic guidance.  The patient has failed conservative care including home exercise, medications, time and activity modification.  This injection will be diagnostic and hopefully therapeutic.  Please see requesting physician notes for further details and justification.   ROS Otherwise per HPI.  Assessment & Plan: Visit Diagnoses:    ICD-10-CM   1. Lumbar radiculopathy  M54.16 XR C-ARM NO REPORT    methylPREDNISolone acetate (DEPO-MEDROL) injection 80 mg    Epidural Steroid injection      Plan: No additional findings.   Meds & Orders:  Meds ordered this encounter  Medications   methylPREDNISolone acetate (DEPO-MEDROL) injection 80 mg    Orders Placed This Encounter  Procedures   XR C-ARM NO REPORT   Epidural Steroid injection    Follow-up: Return for visit to requesting provider as needed.   Procedures: No procedures performed  Lumbar Epidural Steroid Injection - Interlaminar Approach with Fluoroscopic Guidance  Patient: Morgan Molina      Date of Birth: 10-06-1976 MRN: [id number] PCP: Grayce Sessions, NP      Visit Date: 07/06/2022   Universal Protocol:     Consent Given By: the patient  Position: PRONE  Additional Comments: Vital signs were monitored before and after the procedure. Patient was prepped and draped in the usual sterile fashion. The correct patient, procedure, and site was verified.   Injection Procedure Details:   Procedure  diagnoses: Lumbar radiculopathy [M54.16]   Meds Administered:  Meds ordered this encounter  Medications   methylPREDNISolone acetate (DEPO-MEDROL) injection 80 mg     Laterality: Right  Location/Site:  L3-4  Needle: 4.5 in., 20 ga. Tuohy  Needle Placement: Paramedian epidural  Findings:   -Comments: Excellent flow of contrast into the epidural space.  Procedure Details: Using a paramedian approach from the side mentioned above, the region overlying the inferior lamina was localized under fluoroscopic visualization and the soft tissues overlying this structure were infiltrated with 4 ml. of 1% Lidocaine without Epinephrine. The Tuohy needle was inserted into the epidural space using a paramedian approach.   The epidural space was localized using loss of resistance along with counter oblique bi-planar fluoroscopic views.  After negative aspirate for air, blood, and CSF, a 2 ml. volume of Isovue-250 was injected into the epidural space and the flow of contrast was observed. Radiographs were obtained for documentation purposes.    The injectate was administered into the level noted above.   Additional Comments:  The patient tolerated the procedure well Dressing: 2 x 2 sterile gauze and Band-Aid    Post-procedure details: Patient was observed during the procedure. Post-procedure instructions were reviewed.  Patient left the clinic in stable condition.   Clinical History: MRI LUMBAR SPINE WITHOUT CONTRAST   TECHNIQUE: Multiplanar, multisequence MR imaging of the lumbar spine was performed. No intravenous contrast was administered.   COMPARISON:  Prior radiograph from 08/04/2021   FINDINGS:  Segmentation: Standard. Lowest well-formed disc space labeled the L5-S1 level.   Alignment: Trace degenerative anterolisthesis of L3 on L4 and L4 on L5. Alignment otherwise normal preservation of the normal lumbar lordosis.   Vertebrae: Vertebral body height maintained without acute  or chronic fracture. Bone marrow signal intensity diffusely decreased on T1 weighted imaging, nonspecific, but most commonly related to anemia, smoking, or obesity. Mild reactive endplate change present about the L2-3 interspace. Reactive marrow edema present about the right greater than left L4-5 facets due to facet arthritis. No other abnormal marrow edema.   Conus medullaris and cauda equina: Conus extends to the L1-2 level. Conus and cauda equina appear normal.   Paraspinal and other soft tissues: Paraspinous soft tissues demonstrate no acute finding. Visualized visceral structures unremarkable.   Disc levels:   T11-12: Seen only on sagittal projection. Mild disc bulge with disc desiccation. Bilateral facet hypertrophy. No significant spinal stenosis. Moderate bilateral foraminal narrowing.   T12-L1: Normal interspace. Mild bilateral facet hypertrophy. No stenosis.   L1-2: Negative interspace. Mild bilateral facet hypertrophy. No stenosis.   L2-3: Degenerative intervertebral disc space narrowing with diffuse disc bulge and disc desiccation. Superimposed biforaminal disc protrusions, right slightly larger than left (series 6, image 15). Superimposed small left subarticular component noted on the left (series 6, image 16). Moderate bilateral facet hypertrophy. Mild prominence of the dorsal epidural fat. Resultant moderate spinal stenosis. Moderate bilateral L2 foraminal narrowing.   L3-4: Trace listhesis. Diffuse disc bulge with disc desiccation. Superimposed right foraminal disc protrusion contacts the exiting right L3 nerve root (series 6, image 19). Moderate bilateral facet hypertrophy. Resultant mild spinal stenosis. Mild left with moderate right L3 foraminal narrowing.   L4-5: Right foraminal to extraforaminal disc protrusion contacts the exiting right L4 nerve root as it courses of the right neural foramen (series 6, image 24). Severe right worse than left  facet arthrosis. No significant spinal stenosis. Mild left with moderate right L4 foraminal narrowing.   L5-S1: Negative interspace. Severe left with moderate right facet arthrosis. No significant spinal stenosis. Foramina remain patent.   IMPRESSION: 1. Multifactorial degenerative changes at L2-3 with resultant moderate canal and bilateral L2 foraminal stenosis. 2. Right foraminal to extraforaminal disc protrusions at L3-4 and L4-5, contacting and potentially affecting the exiting right L3 and L4 nerve roots respectively. 3. Moderate to severe lower lumbar facet arthrosis, most pronounced at L4-5 on the right and L5-S1 on the left. Findings could contribute to lower back pain.     Electronically Signed   By: Rise Mu M.D.   On: 10/03/2021 01:47     Objective:  VS:  HT:    WT:   BMI:     BP:129/78  HR:(!) 106bpm  TEMP: ( )  RESP:  Physical Exam Vitals and nursing note reviewed.  Constitutional:      General: She is not in acute distress.    Appearance: Normal appearance. She is obese. She is not ill-appearing.  HENT:     Head: Normocephalic and atraumatic.     Right Ear: External ear normal.     Left Ear: External ear normal.  Eyes:     Extraocular Movements: Extraocular movements intact.  Cardiovascular:     Rate and Rhythm: Normal rate.     Pulses: Normal pulses.  Pulmonary:     Effort: Pulmonary effort is normal. No respiratory distress.  Abdominal:     General: There is no distension.     Palpations: Abdomen is soft.  Musculoskeletal:  General: Tenderness present.     Cervical back: Neck supple.     Right lower leg: No edema.     Left lower leg: No edema.     Comments: Patient has good distal strength with no pain over the greater trochanters.  No clonus or focal weakness.  Skin:    Findings: No erythema, lesion or rash.  Neurological:     General: No focal deficit present.     Mental Status: She is alert and oriented to person, place,  and time.     Sensory: No sensory deficit.     Motor: No weakness or abnormal muscle tone.     Coordination: Coordination normal.  Psychiatric:        Mood and Affect: Mood normal.        Behavior: Behavior normal.      Imaging: No results found.

## 2022-07-07 NOTE — Procedures (Signed)
Lumbar Epidural Steroid Injection - Interlaminar Approach with Fluoroscopic Guidance  Patient: Morgan Molina      Date of Birth: June 20, 1976 MRN: [id number] PCP: Grayce Sessions, NP      Visit Date: 07/06/2022   Universal Protocol:     Consent Given By: the patient  Position: PRONE  Additional Comments: Vital signs were monitored before and after the procedure. Patient was prepped and draped in the usual sterile fashion. The correct patient, procedure, and site was verified.   Injection Procedure Details:   Procedure diagnoses: Lumbar radiculopathy [M54.16]   Meds Administered:  Meds ordered this encounter  Medications   methylPREDNISolone acetate (DEPO-MEDROL) injection 80 mg     Laterality: Right  Location/Site:  L3-4  Needle: 4.5 in., 20 ga. Tuohy  Needle Placement: Paramedian epidural  Findings:   -Comments: Excellent flow of contrast into the epidural space.  Procedure Details: Using a paramedian approach from the side mentioned above, the region overlying the inferior lamina was localized under fluoroscopic visualization and the soft tissues overlying this structure were infiltrated with 4 ml. of 1% Lidocaine without Epinephrine. The Tuohy needle was inserted into the epidural space using a paramedian approach.   The epidural space was localized using loss of resistance along with counter oblique bi-planar fluoroscopic views.  After negative aspirate for air, blood, and CSF, a 2 ml. volume of Isovue-250 was injected into the epidural space and the flow of contrast was observed. Radiographs were obtained for documentation purposes.    The injectate was administered into the level noted above.   Additional Comments:  The patient tolerated the procedure well Dressing: 2 x 2 sterile gauze and Band-Aid    Post-procedure details: Patient was observed during the procedure. Post-procedure instructions were reviewed.  Patient left the clinic in stable  condition.

## 2022-07-11 ENCOUNTER — Telehealth: Payer: Self-pay | Admitting: Orthopaedic Surgery

## 2022-07-11 NOTE — Telephone Encounter (Signed)
Pt called requesting a call back. Pt states she had an injection form Dr. Alvester Morin and had no relief. She need to talk to [employer] about return to work date. Pt phone number is [phone].

## 2022-07-11 NOTE — Telephone Encounter (Signed)
I left voicemail requesting return call. 

## 2022-07-11 NOTE — Telephone Encounter (Signed)
I called patient. She states that her [employer] paperwork was completed to return to work on 07/11/2022, however, she just had the ESI on 07/06/2022 and does not have her follow up until 07/25/2022. Work note updated to keep patient out of work until follow up in the office. Faxed to her STD company.

## 2022-07-11 NOTE — Telephone Encounter (Signed)
Patent called returning a call to [facility].

## 2022-07-11 NOTE — Telephone Encounter (Signed)
Duplicate message in chart.  

## 2022-07-12 ENCOUNTER — Encounter (INDEPENDENT_AMBULATORY_CARE_PROVIDER_SITE_OTHER): Payer: Self-pay | Admitting: Primary Care

## 2022-07-12 ENCOUNTER — Ambulatory Visit (INDEPENDENT_AMBULATORY_CARE_PROVIDER_SITE_OTHER): Payer: BC Managed Care – PPO | Admitting: Primary Care

## 2022-07-12 VITALS — BP 128/86 | HR 97 | Temp 99.1°F | Resp 15 | Ht 64.0 in | Wt 303.0 lb

## 2022-07-12 DIAGNOSIS — Z1211 Encounter for screening for malignant neoplasm of colon: Secondary | ICD-10-CM

## 2022-07-12 DIAGNOSIS — R7303 Prediabetes: Secondary | ICD-10-CM | POA: Diagnosis not present

## 2022-07-12 DIAGNOSIS — M62830 Muscle spasm of back: Secondary | ICD-10-CM

## 2022-07-12 DIAGNOSIS — I10 Essential (primary) hypertension: Secondary | ICD-10-CM

## 2022-07-12 DIAGNOSIS — Z7689 Persons encountering health services in other specified circumstances: Secondary | ICD-10-CM

## 2022-07-12 LAB — POCT GLYCOSYLATED HEMOGLOBIN (HGB A1C): HbA1c, POC (prediabetic range): 5.9 % (ref 5.7–6.4)

## 2022-07-12 MED ORDER — WEGOVY 0.25 MG/0.5ML ~~LOC~~ SOAJ: 0.2500 mg | SUBCUTANEOUS | 4 refills | Status: DC

## 2022-07-12 MED ORDER — MELOXICAM 7.5 MG PO TABS: 7.5000 mg | ORAL_TABLET | Freq: Every day | ORAL | 1 refills | Status: DC

## 2022-07-12 MED ORDER — ISOSORBIDE DINITRATE 30 MG PO TABS: 30.0000 mg | ORAL_TABLET | Freq: Two times a day (BID) | ORAL | 1 refills | Status: DC

## 2022-07-12 MED ORDER — IRBESARTAN-HYDROCHLOROTHIAZIDE 150-12.5 MG PO TABS: 1.0000 | ORAL_TABLET | Freq: Every day | ORAL | 1 refills | Status: DC

## 2022-07-12 NOTE — Patient Instructions (Signed)
Semaglutide Injection (Weight Management) What is this medication? SEMAGLUTIDE (SEM a GLOO tide) promotes weight loss. It may also be used to maintain weight loss. It works by decreasing appetite. Changes to diet and exercise are often combined with this medication. This medicine may be used for other purposes; ask your health care provider or pharmacist if you have questions. COMMON BRAND NAME(S): Wegovy What should I tell my care team before I take this medication? They need to know if you have any of these conditions: Endocrine tumors (MEN 2) or if someone in your family had these tumors Eye disease, vision problems Gallbladder disease History of depression or mental health disease History of pancreatitis Kidney disease Stomach or intestine problems Suicidal thoughts, plans, or attempt; a previous suicide attempt by you or a family member Thyroid cancer or if someone in your family had thyroid cancer An unusual or allergic reaction to semaglutide, other medications, foods, dyes, or preservatives Pregnant or trying to get pregnant Breast-feeding How should I use this medication? This medication is injected under the skin. You will be taught how to prepare and give it. Take it as directed on the prescription label. It is given once every week (every 7 days). Keep taking it unless your care team tells you to stop. It is important that you put your used needles and pens in a special sharps container. Do not put them in a trash can. If you do not have a sharps container, call your pharmacist or care team to get one. A special MedGuide will be given to you by the pharmacist with each prescription and refill. Be sure to read this information carefully each time. This medication comes with INSTRUCTIONS FOR USE. Ask your pharmacist for directions on how to use this medication. Read the information carefully. Talk to your pharmacist or care team if you have questions. Talk to your care team about  the use of this medication in children. While it may be prescribed for children as young as 12 years for selected conditions, precautions do apply. Overdosage: If you think you have taken too much of this medicine contact a poison control center or emergency room at once. NOTE: This medicine is only for you. Do not share this medicine with others. What if I miss a dose? If you miss a dose and the next scheduled dose is more than 2 days away, take the missed dose as soon as possible. If you miss a dose and the next scheduled dose is less than 2 days away, do not take the missed dose. Take the next dose at your regular time. Do not take double or extra doses. If you miss your dose for 2 weeks or more, take the next dose at your regular time or call your care team to talk about how to restart this medication. What may interact with this medication? Insulin and other medications for diabetes This list may not describe all possible interactions. Give your health care provider a list of all the medicines, herbs, non-prescription drugs, or dietary supplements you use. Also tell them if you smoke, drink alcohol, or use illegal drugs. Some items may interact with your medicine. What should I watch for while using this medication? Visit your care team for regular checks on your progress. It may be some time before you see the benefit from this medication. Drink plenty of fluids while taking this medication. Check with your care team if you have severe diarrhea, nausea, and vomiting, or if you sweat a   lot. The loss of too much body fluid may make it dangerous for you to take this medication. This medication may affect blood sugar levels. Ask your care team if changes in diet or medications are needed if you have diabetes. If you or your family notice any changes in your behavior, such as new or worsening depression, thoughts of harming yourself, anxiety, other unusual or disturbing thoughts, or memory loss, call  your care team right away. Women should inform their care team if they wish to become pregnant or think they might be pregnant. Losing weight while pregnant is not advised and may cause harm to the unborn child. Talk to your care team for more information. What side effects may I notice from receiving this medication? Side effects that you should report to your care team as soon as possible: Allergic reactions--skin rash, itching, hives, swelling of the face, lips, tongue, or throat Change in vision Dehydration--increased thirst, dry mouth, feeling faint or lightheaded, headache, dark yellow or brown urine Gallbladder problems--severe stomach pain, nausea, vomiting, fever Heart palpitations--rapid, pounding, or irregular heartbeat Kidney injury--decrease in the amount of urine, swelling of the ankles, hands, or feet Pancreatitis--severe stomach pain that spreads to your back or gets worse after eating or when touched, fever, nausea, vomiting Thoughts of suicide or self-harm, worsening mood, feelings of depression Thyroid cancer--new mass or lump in the neck, pain or trouble swallowing, trouble breathing, hoarseness Side effects that usually do not require medical attention (report to your care team if they continue or are bothersome): Diarrhea Loss of appetite Nausea Stomach pain Vomiting This list may not describe all possible side effects. Call your doctor for medical advice about side effects. You may report side effects to FDA at 1-800-FDA-1088. Where should I keep my medication? Keep out of the reach of children and pets. Refrigeration (preferred): Store in the refrigerator. Do not freeze. Keep this medication in the original container until you are ready to take it. Get rid of any unused medication after the expiration date. Room temperature: If needed, prior to cap removal, the pen can be stored at room temperature for up to 28 days. Protect from light. If it is stored at room  temperature, get rid of any unused medication after 28 days or after it expires, whichever is first. It is important to get rid of the medication as soon as you no longer need it or it is expired. You can do this in two ways: Take the medication to a medication take-back program. Check with your pharmacy or law enforcement to find a location. If you cannot return the medication, follow the directions in the MedGuide. NOTE: This sheet is a summary. It may not cover all possible information. If you have questions about this medicine, talk to your doctor, pharmacist, or health care provider.  2023 Elsevier/Gold Standard (2021-01-06 00:00:00)  

## 2022-07-12 NOTE — Progress Notes (Signed)
[facility]  Morgan Molina, is a 46 y.o. female  [id number]  [id number]  DOB - 08-06-1976  Chief Complaint  Patient presents with   Prediabetes   Back Pain    Length of back       Subjective:   Morgan Molina is a 46 y.o. female here today for weight management.  We have previously discussed options and what her insurance would cover will prescribe weight loss medication Wegovy. Patient has No headache, No chest pain, No abdominal pain - No Nausea, No new weakness tingling or numbness, No Cough - shortness of breath. Chronic back pain managed by orthopedics.   No problems updated.  Allergies  Allergen Reactions   Latex Dermatitis   Lisinopril Cough   Adhesive [Tape] Rash    Past Medical History:  Diagnosis Date   Anxiety    Depression    Hypertension    Muscle spasm    PTSD (post-traumatic stress disorder) 09/28/2019   Syncope     Current Outpatient Medications on File Prior to Visit  Medication Sig Dispense Refill   acetaminophen (TYLENOL) 325 MG tablet Take 2 tablets (650 mg total) by mouth every 6 (six) hours as needed for mild pain (or Fever >/= 101).     clonazePAM (KLONOPIN) 1 MG tablet Take 1 tablet (1 mg total) by mouth 2 (two) times daily as needed for anxiety. 120 tablet 1   ibuprofen (ADVIL) 200 MG tablet Take 200 mg by mouth every 6 (six) hours as needed for mild pain.     No current facility-administered medications on file prior to visit.    Objective:   Vitals:   07/12/22 1517  BP: 128/86  Pulse: 97  Resp: 15  Temp: 99.1 F (37.3 C)  SpO2: 98%  Weight: (!) 303 lb (137.4 kg)  Height: 5\' 4"  (1.626 m)    Exam General appearance : Awake, alert, in acute distress back pain, severe morbid obesity, speech Clear. Not toxic looking HEENT: Atraumatic and Normocephalic, pupils equally reactive to light and accomodation Neck: Supple, no JVD. No cervical lymphadenopathy.  Chest: Good air entry bilaterally, no added sounds   CVS: S1 S2 regular, no murmurs.  Abdomen: Bowel sounds present, Non tender and not distended with no gaurding, rigidity or rebound. Extremities: B/L Lower Ext shows no edema, both legs are warm to touch Neurology: Awake alert, and oriented X 3, Non focal Skin: No Rash  Data Review Lab Results  Component Value Date   HGBA1C 5.9 07/12/2022   HGBA1C 5.8 (A) 01/25/2022   HGBA1C 5.9 (H) 06/16/2021    Assessment & Plan  Morgan Molina was seen today for prediabetes and back pain.  Diagnoses and all orders for this visit:  Prediabetes -     HgB A1c 5.9  Colon cancer screening -     Ambulatory referral to Gastroenterology  Essential hypertension Blood pressure well managed less than 130/90 BP goal - < 130/80 Explained that having normal blood pressure is the goal and medications are helping to get to goal and maintain normal blood pressure. DIET: Limit salt intake, read nutrition labels to check salt content, limit fried and high fatty foods  Avoid using multisymptom OTC cold preparations that generally contain sudafed which can rise BP. Consult with pharmacist on best cold relief products to use for persons with HTN EXERCISE Discussed incorporating exercise such as walking - 30 minutes most days of the week and can do in 10 minute intervals    -  isosorbide dinitrate (ISORDIL) 30 MG tablet; Take 1 tablet (30 mg total) by mouth 2 (two) times daily.  Muscle spasm of back -     meloxicam (MOBIC) 7.5 MG tablet; Take 1 tablet (7.5 mg total) by mouth daily.  Encounter for weight management Prescribed Semaglutide-Weight Management (WEGOVY) 0.25 MG/0.5ML SOAJ; Inject 0.25 mg into the skin once a week.  Information printed on AVS will follow-up 1 month  Other orders -     Semaglutide-Weight Management (WEGOVY) 0.25 MG/0.5ML SOAJ; Inject 0.25 mg into the skin once a week. -     irbesartan-hydrochlorothiazide (AVALIDE) 150-12.5 MG tablet; Take 1 tablet by mouth daily.    Patient have been  counseled extensively about nutrition and exercise. Other issues discussed during this visit include: low cholesterol diet, weight control and daily exercise, foot care, annual eye examinations at Ophthalmology, importance of adherence with medications and regular follow-up. We also discussed long term complications of uncontrolled diabetes and hypertension.   Return in about 4 weeks (around 08/09/2022) for weight management.  The patient was given clear instructions to go to ER or return to medical center if symptoms don't improve, worsen or new problems develop. The patient verbalized understanding. The patient was told to call to get lab results if they haven't heard anything in the next week.   This note has been created with Education officer, environmental. Any transcriptional errors are unintentional.   Grayce Sessions, NP 07/12/2022, 10:18 PM

## 2022-07-13 ENCOUNTER — Encounter (INDEPENDENT_AMBULATORY_CARE_PROVIDER_SITE_OTHER): Payer: BC Managed Care – PPO | Admitting: Family Medicine

## 2022-07-25 ENCOUNTER — Ambulatory Visit (INDEPENDENT_AMBULATORY_CARE_PROVIDER_SITE_OTHER): Payer: BC Managed Care – PPO | Admitting: Orthopaedic Surgery

## 2022-07-25 ENCOUNTER — Telehealth: Payer: Self-pay | Admitting: Radiology

## 2022-07-25 ENCOUNTER — Encounter: Payer: Self-pay | Admitting: Orthopaedic Surgery

## 2022-07-25 VITALS — BP 165/96 | HR 81 | Ht 64.0 in | Wt 303.0 lb

## 2022-07-25 DIAGNOSIS — M5440 Lumbago with sciatica, unspecified side: Secondary | ICD-10-CM | POA: Diagnosis not present

## 2022-07-25 DIAGNOSIS — M542 Cervicalgia: Secondary | ICD-10-CM | POA: Diagnosis not present

## 2022-07-25 DIAGNOSIS — G8929 Other chronic pain: Secondary | ICD-10-CM | POA: Diagnosis not present

## 2022-07-25 MED ORDER — TRAMADOL HCL 50 MG PO TABS: 50.0000 mg | ORAL_TABLET | Freq: Two times a day (BID) | ORAL | 0 refills | Status: DC | PRN

## 2022-07-25 NOTE — Telephone Encounter (Signed)
Received email requesting prior authorization for Tramadol.  Entered through Cover My Meds-awaiting response.  Glyn Ade Key: [id number] - PA Case ID: [id number] - Rx #: [id number] Need help? Call us at [phone] Status Sent to [employer] Drug traMADol HCl 50MG  tablets Form [employer] Electronic PA Form (2017 NCPDP) Original Claim Info 51

## 2022-07-25 NOTE — Telephone Encounter (Signed)
Medication approved.   Glyn Ade Key: [id number] - PA Case ID: [id number] - Rx #: [id number] Need help? Call us at [phone] Outcome Approvedtoday Your PA request has been approved. Additional information will be provided in the approval communication. (Message 1145) Drug traMADol HCl 50MG  tablets Form [employer] Electronic PA Form [phone] NCPDP) Original Claim Info 108

## 2022-07-25 NOTE — Progress Notes (Unsigned)
Office Visit Note   Patient: Morgan Molina           Date of Birth: [birth year]           MRN: [id number] Visit Date: 07/25/2022              Requested by: Kerin Perna, NP [address] [place],   [address] PCP: Kerin Perna, NP   Assessment & Plan: Visit Diagnoses: No diagnosis found.  Plan: ***  Follow-Up Instructions: No follow-ups on file.   Orders:  No orders of the defined types were placed in this encounter.  No orders of the defined types were placed in this encounter.     Procedures: No procedures performed   Clinical Data: No additional findings.   Subjective: Chief Complaint  Patient presents with   Lower Back - Pain, Follow-up    HPI  Review of Systems   Objective: Vital Signs: BP (!) 165/96   Pulse 81   Ht 5\' 4"  (1.626 m)   Wt (!) 303 lb (137.4 kg)   BMI 52.01 kg/m   Physical Exam  Ortho Exam  Specialty Comments:  MRI LUMBAR SPINE WITHOUT CONTRAST   TECHNIQUE: Multiplanar, multisequence MR imaging of the lumbar spine was performed. No intravenous contrast was administered.   COMPARISON:  Prior radiograph from 08/04/2021   FINDINGS: Segmentation: Standard. Lowest well-formed disc space labeled the L5-S1 level.   Alignment: Trace degenerative anterolisthesis of L3 on L4 and L4 on L5. Alignment otherwise normal preservation of the normal lumbar lordosis.   Vertebrae: Vertebral body height maintained without acute or chronic fracture. Bone marrow signal intensity diffusely decreased on T1 weighted imaging, nonspecific, but most commonly related to anemia, smoking, or obesity. Mild reactive endplate change present about the L2-3 interspace. Reactive marrow edema present about the right greater than left L4-5 facets due to facet arthritis. No other abnormal marrow edema.   Conus medullaris and cauda equina: Conus extends to the L1-2 level. Conus and cauda equina appear normal.   Paraspinal and  other soft tissues: Paraspinous soft tissues demonstrate no acute finding. Visualized visceral structures unremarkable.   Disc levels:   T11-12: Seen only on sagittal projection. Mild disc bulge with disc desiccation. Bilateral facet hypertrophy. No significant spinal stenosis. Moderate bilateral foraminal narrowing.   T12-L1: Normal interspace. Mild bilateral facet hypertrophy. No stenosis.   L1-2: Negative interspace. Mild bilateral facet hypertrophy. No stenosis.   L2-3: Degenerative intervertebral disc space narrowing with diffuse disc bulge and disc desiccation. Superimposed biforaminal disc protrusions, right slightly larger than left (series 6, image 15). Superimposed small left subarticular component noted on the left (series 6, image 16). Moderate bilateral facet hypertrophy. Mild prominence of the dorsal epidural fat. Resultant moderate spinal stenosis. Moderate bilateral L2 foraminal narrowing.   L3-4: Trace listhesis. Diffuse disc bulge with disc desiccation. Superimposed right foraminal disc protrusion contacts the exiting right L3 nerve root (series 6, image 19). Moderate bilateral facet hypertrophy. Resultant mild spinal stenosis. Mild left with moderate right L3 foraminal narrowing.   L4-5: Right foraminal to extraforaminal disc protrusion contacts the exiting right L4 nerve root as it courses of the right neural foramen (series 6, image 24). Severe right worse than left facet arthrosis. No significant spinal stenosis. Mild left with moderate right L4 foraminal narrowing.   L5-S1: Negative interspace. Severe left with moderate right facet arthrosis. No significant spinal stenosis. Foramina remain patent.   IMPRESSION: 1. Multifactorial degenerative changes at L2-3 with resultant moderate canal  and bilateral L2 foraminal stenosis. 2. Right foraminal to extraforaminal disc protrusions at L3-4 and L4-5, contacting and potentially affecting the exiting right L3  and L4 nerve roots respectively. 3. Moderate to severe lower lumbar facet arthrosis, most pronounced at L4-5 on the right and L5-S1 on the left. Findings could contribute to lower back pain.     Electronically Signed   By: Rise Mu M.D.   On: 10/03/2021 01:47  Imaging: No results found.   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   Ganglion cyst of dorsum of left wrist 10/01/2019   Decreased visual acuity 10/01/2019   PTSD (post-traumatic stress disorder) 09/28/2019   Nipple discharge 04/02/2019   Panic attacks 02/19/2019   Bilateral low back pain 02/19/2019   Seasonal allergies 02/19/2019   Anxiety and depression 01/14/2019   Syncope 01/14/2019   Breast mass, right 01/14/2019   Hypertension    Past Medical History:  Diagnosis Date   Anxiety    Depression    Hypertension    Muscle spasm    PTSD (post-traumatic stress disorder) 09/28/2019   Syncope     Family History  Problem Relation Age of Onset   Cancer Mother    Hypertension Mother    Heart attack Mother    Breast cancer Mother 53   CAD Father    Hypertension Father    Diabetes Father     Past Surgical History:  Procedure Laterality Date   BREAST EXCISIONAL BIOPSY Right    CARPAL TUNNEL RELEASE Left 02/20/2020   Procedure: LEFT CARPAL TUNNEL RELEASE;  Surgeon: Betha Loa, MD;  Location: [facility];  Service: Orthopedics;  Laterality: Left;   CARPAL TUNNEL RELEASE Right 02/28/2021   Procedure: CARPAL TUNNEL RELEASE;   Surgeon: Betha Loa, MD;  Location: [facility];  Service: Orthopedics;  Laterality: Right;   CHOLECYSTECTOMY     GANGLION CYST EXCISION Left 02/20/2020   Procedure: LEFT DORSAL GANGLION EXCISION;  Surgeon: Betha Loa, MD;  Location: [facility];  Service: Orthopedics;  Laterality: Left;   TRIGGER FINGER RELEASE Bilateral 08/29/2021   Procedure: RELEASE TRIGGER FINGER/A-1 PULLEY BILATERAL THUMBS;  Surgeon: Betha Loa, MD;  Location: [facility];  Service: Orthopedics;  Laterality: Bilateral;  30 MIN   TUBAL LIGATION     Social History   Occupational History   Occupation: Clinical biochemist  Tobacco Use   Smoking status: Former    Packs/day: 0.10    Types: Cigarettes    Quit date: 10/26/2019    Years since quitting: 2.7   Smokeless tobacco: Never  Vaping Use   Vaping Use: Never used  Substance and Sexual Activity   Alcohol use: Yes    Comment: occasional   Drug use: No   Sexual activity: Not on file

## 2022-08-01 NOTE — Progress Notes (Signed)
Appointment was cancelled. Patient was not seen or examined.  

## 2022-08-06 ENCOUNTER — Other Ambulatory Visit (INDEPENDENT_AMBULATORY_CARE_PROVIDER_SITE_OTHER): Payer: Self-pay | Admitting: Primary Care

## 2022-08-06 DIAGNOSIS — Z1211 Encounter for screening for malignant neoplasm of colon: Secondary | ICD-10-CM

## 2022-08-09 ENCOUNTER — Encounter (INDEPENDENT_AMBULATORY_CARE_PROVIDER_SITE_OTHER): Payer: Self-pay | Admitting: Primary Care

## 2022-08-09 ENCOUNTER — Ambulatory Visit
Admission: RE | Admit: 2022-08-09 | Discharge: 2022-08-09 | Disposition: A | Discharge status: Home / Self Care | Payer: BC Managed Care – PPO | Source: Ambulatory Visit | Attending: Orthopaedic Surgery | Admitting: Orthopaedic Surgery

## 2022-08-09 ENCOUNTER — Ambulatory Visit (INDEPENDENT_AMBULATORY_CARE_PROVIDER_SITE_OTHER): Payer: BC Managed Care – PPO | Admitting: Primary Care

## 2022-08-09 VITALS — BP 125/84 | HR 76 | Resp 16 | Wt 307.6 lb

## 2022-08-09 DIAGNOSIS — Z1159 Encounter for screening for other viral diseases: Secondary | ICD-10-CM

## 2022-08-09 DIAGNOSIS — M542 Cervicalgia: Secondary | ICD-10-CM

## 2022-08-09 MED ORDER — WEGOVY 0.25 MG/0.5ML ~~LOC~~ SOAJ: 0.2500 mg | SUBCUTANEOUS | 4 refills | Status: DC

## 2022-08-09 NOTE — Progress Notes (Signed)
Morgan Molina, is a 46 y.o. female  [id number]  [id number]  DOB - January 24, 1976  Chief Complaint  Patient presents with   Weight Management Screening    Follow up Pt has not been able to get the [facility]       Subjective:   Morgan Molina is a 46 y.o. female here today for a follow up visit initially was for weight loss management.  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  Patient has No headache, No chest pain, No abdominal pain - No Nausea, No new weakness tingling or numbness, No Cough - shortness of breath.  Patient has chronic back pain and followed by orthopedics.  Blood pressure is unremarkable.  No problems updated.  Allergies  Allergen Reactions   Latex Dermatitis   Lisinopril Cough   Tramadol Nausea And Vomiting    ALL   Adhesive [Tape] Rash    Past Medical History:  Diagnosis Date   Anxiety    Depression    Hypertension    Muscle spasm    PTSD (post-traumatic stress disorder) 09/28/2019   Syncope     Current Outpatient Medications on File Prior to Visit  Medication Sig Dispense Refill   clonazePAM (KLONOPIN) 1 MG tablet Take 1 tablet (1 mg total) by mouth 2 (two) times daily as needed for anxiety. 120 tablet 1   irbesartan-hydrochlorothiazide (AVALIDE) 150-12.5 MG tablet Take 1 tablet by mouth daily. 90 tablet 1   isosorbide dinitrate (ISORDIL) 30 MG tablet Take 1 tablet (30 mg total) by mouth 2 (two) times daily. 90 tablet 1   meloxicam (MOBIC) 7.5 MG tablet Take 1 tablet (7.5 mg total) by mouth daily. 90 tablet 1   No current facility-administered medications on file prior to visit.    Objective:   Vitals:   08/09/22 1007  BP: 125/84  Pulse: 76  Resp: 16  SpO2: 100%  Weight: (!) 307 lb 9.6 oz (139.5 kg)    Exam General appearance : Awake, alert, not in any distress. Speech Clear. Not toxic looking HEENT: Atraumatic and Normocephalic, pupils equally reactive to light and accomodation Neck: Supple,  no JVD. No cervical lymphadenopathy.  Chest: Good air entry bilaterally, no added sounds  CVS: S1 S2 regular, no murmurs.  Abdomen: Bowel sounds present, Non tender and not distended with no gaurding, rigidity or rebound. Extremities: B/L Lower Ext shows no edema, both legs are warm to touch Neurology: Awake alert, and oriented X 3, CN II-XII intact, Non focal Skin: No Rash  Data Review Lab Results  Component Value Date   HGBA1C 5.9 07/12/2022   HGBA1C 5.8 (A) 01/25/2022   HGBA1C 5.9 (H) 06/16/2021    Assessment & Plan   1. Need for hepatitis C screening test - HCV Ab w Reflex to Quant PCR; Future  2. Morbidly obese ([place]) Spoke with pharmacy representative from the company stated hopeful for January 24. Manufactory back log. - Semaglutide-Weight Management (WEGOVY) 0.25 MG/0.5ML SOAJ; Inject 0.25 mg into the skin once a week.  Dispense: 0.5 mL; Refill: 4   Patient have been counseled extensively about nutrition and exercise. Other issues discussed during this visit include: low cholesterol diet, weight control and daily exercise, foot care, annual eye examinations at Ophthalmology, importance of adherence with medications and regular follow-up. We also discussed long term complications of uncontrolled diabetes and hypertension.   Return in about 6 months (around 02/08/2023) for Bp.  The patient was given clear instructions to go to ER or return  to medical center if symptoms don't improve, worsen or new problems develop. The patient verbalized understanding. The patient was told to call to get lab results if they haven't heard anything in the next week.   This note has been created with Education officer, environmental. Any transcriptional errors are unintentional.   Grayce Sessions, NP 08/09/2022, 10:57 AM

## 2022-08-18 ENCOUNTER — Ambulatory Visit: Payer: BC Managed Care – PPO | Admitting: Orthopaedic Surgery

## 2022-08-29 ENCOUNTER — Telehealth (INDEPENDENT_AMBULATORY_CARE_PROVIDER_SITE_OTHER): Payer: Self-pay | Admitting: Primary Care

## 2022-08-29 ENCOUNTER — Ambulatory Visit (INDEPENDENT_AMBULATORY_CARE_PROVIDER_SITE_OTHER): Payer: BC Managed Care – PPO | Admitting: Orthopaedic Surgery

## 2022-08-29 ENCOUNTER — Other Ambulatory Visit: Payer: Self-pay | Admitting: Pharmacist

## 2022-08-29 ENCOUNTER — Encounter: Payer: Self-pay | Admitting: Orthopaedic Surgery

## 2022-08-29 DIAGNOSIS — M4802 Spinal stenosis, cervical region: Secondary | ICD-10-CM | POA: Diagnosis not present

## 2022-08-29 MED ORDER — OZEMPIC (0.25 OR 0.5 MG/DOSE) 2 MG/3ML ~~LOC~~ SOPN: 0.2500 mg | PEN_INJECTOR | SUBCUTANEOUS | 1 refills | Status: DC

## 2022-08-29 MED ORDER — TRAMADOL HCL 50 MG PO TABS: 50.0000 mg | ORAL_TABLET | Freq: Two times a day (BID) | ORAL | 0 refills | Status: DC | PRN

## 2022-08-29 NOTE — Telephone Encounter (Signed)
Copied from [place] [phone]. Topic: General - Other >> Aug 29, 2022 11:39 AM Morgan Molina wrote: Reason for CRM: The patient has called requesting to speak directly with their PCP when possible  The patient would like to follow up with Prov. Edwards on a previous discussion   Please contact the patient further when possible

## 2022-08-29 NOTE — Telephone Encounter (Signed)
Seen therapist and ortho suggest wegovy back order until 1/24.per consult asked to prescribe a higher dose since it dials up and down. Will forward to clinical pharmacist.

## 2022-08-29 NOTE — Progress Notes (Unsigned)
Office Visit Note   Patient: Morgan Molina           Date of Birth: 1976-10-29           MRN: [id number] Visit Date: 08/29/2022              Requested by: Kerin Perna, NP [address] [place],  [place] [address] PCP: Kerin Perna, NP   Assessment & Plan: Visit Diagnoses: No diagnosis found.  Plan: ***  Follow-Up Instructions: No follow-ups on file.   Orders:  No orders of the defined types were placed in this encounter.  No orders of the defined types were placed in this encounter.     Procedures: No procedures performed   Clinical Data: No additional findings.   Subjective: Chief Complaint  Patient presents with   Neck - Pain, Follow-up    MRI cervical spine review    HPI  Review of Systems   Objective: Vital Signs: BP 125/83   Pulse 75   Ht 5\' 4"  (1.626 m)   Wt (!) 307 lb (139.3 kg)   BMI 52.70 kg/m   Physical Exam  Ortho Exam  Specialty Comments:  MRI LUMBAR SPINE WITHOUT CONTRAST   TECHNIQUE: Multiplanar, multisequence MR imaging of the lumbar spine was performed. No intravenous contrast was administered.   COMPARISON:  Prior radiograph from 08/04/2021   FINDINGS: Segmentation: Standard. Lowest well-formed disc space labeled the L5-S1 level.   Alignment: Trace degenerative anterolisthesis of L3 on L4 and L4 on L5. Alignment otherwise normal preservation of the normal lumbar lordosis.   Vertebrae: Vertebral body height maintained without acute or chronic fracture. Bone marrow signal intensity diffusely decreased on T1 weighted imaging, nonspecific, but most commonly related to anemia, smoking, or obesity. Mild reactive endplate change present about the L2-3 interspace. Reactive marrow edema present about the right greater than left L4-5 facets due to facet arthritis. No other abnormal marrow edema.   Conus medullaris and cauda equina: Conus extends to the L1-2 level. Conus and cauda equina appear normal.    Paraspinal and other soft tissues: Paraspinous soft tissues demonstrate no acute finding. Visualized visceral structures unremarkable.   Disc levels:   T11-12: Seen only on sagittal projection. Mild disc bulge with disc desiccation. Bilateral facet hypertrophy. No significant spinal stenosis. Moderate bilateral foraminal narrowing.   T12-L1: Normal interspace. Mild bilateral facet hypertrophy. No stenosis.   L1-2: Negative interspace. Mild bilateral facet hypertrophy. No stenosis.   L2-3: Degenerative intervertebral disc space narrowing with diffuse disc bulge and disc desiccation. Superimposed biforaminal disc protrusions, right slightly larger than left (series 6, image 15). Superimposed small left subarticular component noted on the left (series 6, image 16). Moderate bilateral facet hypertrophy. Mild prominence of the dorsal epidural fat. Resultant moderate spinal stenosis. Moderate bilateral L2 foraminal narrowing.   L3-4: Trace listhesis. Diffuse disc bulge with disc desiccation. Superimposed right foraminal disc protrusion contacts the exiting right L3 nerve root (series 6, image 19). Moderate bilateral facet hypertrophy. Resultant mild spinal stenosis. Mild left with moderate right L3 foraminal narrowing.   L4-5: Right foraminal to extraforaminal disc protrusion contacts the exiting right L4 nerve root as it courses of the right neural foramen (series 6, image 24). Severe right worse than left facet arthrosis. No significant spinal stenosis. Mild left with moderate right L4 foraminal narrowing.   L5-S1: Negative interspace. Severe left with moderate right facet arthrosis. No significant spinal stenosis. Foramina remain patent.   IMPRESSION: 1. Multifactorial degenerative changes at  L2-3 with resultant moderate canal and bilateral L2 foraminal stenosis. 2. Right foraminal to extraforaminal disc protrusions at L3-4 and L4-5, contacting and potentially affecting the  exiting right L3 and L4 nerve roots respectively. 3. Moderate to severe lower lumbar facet arthrosis, most pronounced at L4-5 on the right and L5-S1 on the left. Findings could contribute to lower back pain.     Electronically Signed   By: Rise Mu M.D.   On: 10/03/2021 01:47  Imaging: No results found.   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   Ganglion cyst of dorsum of left wrist 10/01/2019   Decreased visual acuity 10/01/2019   PTSD (post-traumatic stress disorder) 09/28/2019   Nipple discharge 04/02/2019   Panic attacks 02/19/2019   Bilateral low back pain 02/19/2019   Seasonal allergies 02/19/2019   Anxiety and depression 01/14/2019   Syncope 01/14/2019   Breast mass, right 01/14/2019   Hypertension    Past Medical History:  Diagnosis Date   Anxiety    Depression    Hypertension    Muscle spasm    PTSD (post-traumatic stress disorder) 09/28/2019   Syncope     Family History  Problem Relation Age of Onset   Cancer Mother    Hypertension Mother    Heart attack Mother    Breast cancer Mother 75   CAD Father    Hypertension Father    Diabetes Father     Past Surgical History:  Procedure Laterality Date   BREAST EXCISIONAL BIOPSY Right    CARPAL TUNNEL RELEASE Left 02/20/2020   Procedure: LEFT CARPAL TUNNEL RELEASE;  Surgeon: Betha Loa, MD;  Location: [facility];  Service: Orthopedics;  Laterality: Left;   CARPAL TUNNEL RELEASE Right 02/28/2021   Procedure: CARPAL  TUNNEL RELEASE;  Surgeon: Betha Loa, MD;  Location: [facility];  Service: Orthopedics;  Laterality: Right;   CHOLECYSTECTOMY     GANGLION CYST EXCISION Left 02/20/2020   Procedure: LEFT DORSAL GANGLION EXCISION;  Surgeon: Betha Loa, MD;  Location: [facility];  Service: Orthopedics;  Laterality: Left;   TRIGGER FINGER RELEASE Bilateral 08/29/2021   Procedure: RELEASE TRIGGER FINGER/A-1 PULLEY BILATERAL THUMBS;  Surgeon: Betha Loa, MD;  Location: [facility];  Service: Orthopedics;  Laterality: Bilateral;  30 MIN   TUBAL LIGATION     Social History   Occupational History   Occupation: Clinical biochemist  Tobacco Use   Smoking status: Former    Packs/day: 0.10    Types: Cigarettes    Quit date: 10/26/2019    Years since quitting: 2.8   Smokeless tobacco: Never  Vaping Use   Vaping Use: Never used  Substance and Sexual Activity   Alcohol use: Yes    Comment: occasional   Drug use: No   Sexual activity: Not on file

## 2022-08-30 DIAGNOSIS — M4802 Spinal stenosis, cervical region: Secondary | ICD-10-CM | POA: Insufficient documentation

## 2022-09-01 ENCOUNTER — Telehealth (INDEPENDENT_AMBULATORY_CARE_PROVIDER_SITE_OTHER): Payer: Self-pay

## 2022-09-01 NOTE — Telephone Encounter (Signed)
Contacted pt to schedule a surgery clearance pt didn't answer lvm

## 2022-09-06 ENCOUNTER — Other Ambulatory Visit: Payer: Self-pay

## 2022-09-20 ENCOUNTER — Telehealth (INDEPENDENT_AMBULATORY_CARE_PROVIDER_SITE_OTHER): Payer: Self-pay | Admitting: Primary Care

## 2022-09-20 NOTE — Telephone Encounter (Signed)
Copied from CRM [id number]. Topic: General - Other >> Sep 20, 2022  2:45 PM Franchot Heidelberg wrote: Reason for CRM: Pt wants to be scheduled for the December the 4th pre op clearance appt, wants a call back from PCP to confirm.   Best contact: [phone]

## 2022-09-20 NOTE — Telephone Encounter (Signed)
Returned pt call and made pt aware that Morgan Molina next appt isn't till Dec 4. Pt states she has surgery on 11/29. Pt states when I called her the first time she called back and spoke to the [facility]. Made her aware that I didn't a receive a message. Pt states she will check with ortho to see what they say. Pt states she will call back. Made pt aware that I will look for a sooner appt

## 2022-09-20 NOTE — Telephone Encounter (Signed)
Pt has called in wanting to sch a surgery clearance appt Agent gave soonest appt which is Dec. 6, pt states that is not acceptable at all, states she is sch for 11/29, (Epic shows not sch as of yet) Pt does not want to give any info at all very vague, does not even want to disclose or verify phone #  for call back. She states that she has reached out before and we have all the info we need.  afu call at  number calling from [phone]

## 2022-09-21 ENCOUNTER — Encounter (INDEPENDENT_AMBULATORY_CARE_PROVIDER_SITE_OTHER): Payer: Self-pay | Admitting: Primary Care

## 2022-09-21 ENCOUNTER — Ambulatory Visit (INDEPENDENT_AMBULATORY_CARE_PROVIDER_SITE_OTHER): Payer: BC Managed Care – PPO | Admitting: Primary Care

## 2022-09-21 ENCOUNTER — Other Ambulatory Visit (INDEPENDENT_AMBULATORY_CARE_PROVIDER_SITE_OTHER): Payer: Self-pay | Admitting: Primary Care

## 2022-09-21 ENCOUNTER — Other Ambulatory Visit: Payer: Self-pay | Admitting: Pharmacist

## 2022-09-21 VITALS — BP 105/72 | HR 85 | Resp 16 | Wt 318.0 lb

## 2022-09-21 DIAGNOSIS — I10 Essential (primary) hypertension: Secondary | ICD-10-CM

## 2022-09-21 DIAGNOSIS — Z01818 Encounter for other preprocedural examination: Secondary | ICD-10-CM

## 2022-09-21 DIAGNOSIS — M4802 Spinal stenosis, cervical region: Secondary | ICD-10-CM | POA: Diagnosis not present

## 2022-09-21 MED ORDER — IBUPROFEN 800 MG PO TABS: 800.0000 mg | ORAL_TABLET | Freq: Three times a day (TID) | ORAL | 1 refills | Status: DC | PRN

## 2022-09-21 MED ORDER — WEGOVY 1 MG/0.5ML ~~LOC~~ SOAJ: 1.0000 mg | SUBCUTANEOUS | 3 refills | Status: DC

## 2022-09-21 NOTE — Progress Notes (Signed)
[facility]  Subjective:     [name] presents to the clinic for surgical clearance for  C4-5, C5-6, C6-7 ANTERIOR CERVICAL DISCECTOMY FUSION, ALLOGRAFT, PLATE orthopedist Dr. Lorin Mercy. Eating a regular diet with difficulty. Bowel movements are Normal.  Patient has No headache, No chest pain, No abdominal pain - No Nausea, No new weakness tingling or numbness, No Cough - shortness of breath    Objective:    BP 105/72   Pulse 85   Resp 16   Wt (!) 318 lb (144.2 kg)   SpO2 97%   BMI 54.58 kg/m      Assessment:  Physical exam: General: Vital signs reviewed.  Patient is well-developed and well-nourished, morbid obese female  in no acute distress and cooperative with exam. Head: Normocephalic and atraumatic. Eyes: EOMI, conjunctivae normal, no scleral icterus. Neck: Supple, trachea midline, elicit pain with RON no JVD, masses, thyromegaly, or carotid bruit present. Cardiovascular: RRR, S1 normal, S2 normal, no murmurs, gallops, or rubs. Pulmonary/Chest: Clear to auscultation bilaterally, no wheezes, rales, or rhonchi. Abdominal: Soft, non-tender, non-distended, BS +, no masses, organomegaly, or guarding present. Musculoskeletal: No joint deformities, erythema, or stiffness, ROM full and nontender. Extremities: No lower extremity edema bilaterally,  pulses symmetric and intact bilaterally. No cyanosis or clubbing. Neurological: A&O x3, Strength is normal Skin: Warm, dry and intact. No rashes or erythema. Psychiatric: Normal mood and affect. speech and behavior is normal. Cognition and memory are normal.    Plan:  Morgan Molina was seen today for surgical clearance.  Diagnoses and all orders for this visit:  Essential hypertension BP goal - is met  < 130/80 Explained that having normal blood pressure is the goal and medications are helping to get to goal and maintain normal blood pressure. DIET: Limit salt intake, read nutrition labels to check salt content,  limit fried and high fatty foods  Avoid using multisymptom OTC cold preparations that generally contain sudafed which can rise BP. Consult with pharmacist on best cold relief products to use for persons with HTN EXERCISE Discussed incorporating exercise such as walking - 30 minutes most days of the week and can do in 10 minute intervals    -     EKG 12-Lead  Pre-operative clearance 2/2 Foraminal stenosis of cervical region -     CBC with Differential -     CMP14+EGFR -     Protime-INR   Continue any current medications. This note has been created with Surveyor, quantity. Any transcriptional errors are unintentional.   Kerin Perna, NP 09/21/2022, 8:56 AM

## 2022-09-22 LAB — CBC WITH DIFFERENTIAL/PLATELET
Basophils Absolute: 0 10*3/uL (ref 0.0–0.2)
Basos: 1 %
EOS (ABSOLUTE): 0.2 10*3/uL (ref 0.0–0.4)
Eos: 3 %
Hematocrit: 34.2 % (ref 34.0–46.6)
Hemoglobin: 11 g/dL — ABNORMAL LOW (ref 11.1–15.9)
Immature Grans (Abs): 0 10*3/uL (ref 0.0–0.1)
Immature Granulocytes: 0 %
Lymphocytes Absolute: 2.4 10*3/uL (ref 0.7–3.1)
Lymphs: 40 %
MCH: 26.1 pg — ABNORMAL LOW (ref 26.6–33.0)
MCHC: 32.2 g/dL (ref 31.5–35.7)
MCV: 81 fL (ref 79–97)
Monocytes Absolute: 0.5 10*3/uL (ref 0.1–0.9)
Monocytes: 8 %
Neutrophils Absolute: 2.9 10*3/uL (ref 1.4–7.0)
Neutrophils: 48 %
Platelets: 441 10*3/uL (ref 150–450)
RBC: 4.21 x10E6/uL (ref 3.77–5.28)
RDW: 16.3 % — ABNORMAL HIGH (ref 11.7–15.4)
WBC: 5.9 10*3/uL (ref 3.4–10.8)

## 2022-09-22 LAB — CMP14+EGFR
ALT: 12 IU/L (ref 0–32)
AST: 10 IU/L (ref 0–40)
Albumin/Globulin Ratio: 1.7 (ref 1.2–2.2)
Albumin: 4.3 g/dL (ref 3.9–4.9)
Alkaline Phosphatase: 49 IU/L (ref 44–121)
BUN/Creatinine Ratio: 13 (ref 9–23)
BUN: 9 mg/dL (ref 6–24)
Bilirubin Total: 0.3 mg/dL (ref 0.0–1.2)
CO2: 27 mmol/L (ref 20–29)
Calcium: 9.3 mg/dL (ref 8.7–10.2)
Chloride: 101 mmol/L (ref 96–106)
Creatinine, Ser: 0.68 mg/dL (ref 0.57–1.00)
Globulin, Total: 2.6 g/dL (ref 1.5–4.5)
Glucose: 98 mg/dL (ref 70–99)
Potassium: 4.2 mmol/L (ref 3.5–5.2)
Sodium: 140 mmol/L (ref 134–144)
Total Protein: 6.9 g/dL (ref 6.0–8.5)
eGFR: 109 mL/min/{1.73_m2} (ref >59)

## 2022-09-22 LAB — PROTIME-INR

## 2022-09-25 ENCOUNTER — Ambulatory Visit (INDEPENDENT_AMBULATORY_CARE_PROVIDER_SITE_OTHER): Payer: BC Managed Care – PPO | Admitting: Surgery

## 2022-09-25 ENCOUNTER — Other Ambulatory Visit: Payer: Self-pay

## 2022-09-25 ENCOUNTER — Encounter: Payer: Self-pay | Admitting: Surgery

## 2022-09-25 VITALS — BP 142/83 | HR 85 | Ht 64.0 in | Wt 318.0 lb

## 2022-09-25 DIAGNOSIS — M4802 Spinal stenosis, cervical region: Secondary | ICD-10-CM

## 2022-09-25 NOTE — Progress Notes (Signed)
Surgical Instructions    Your procedure is scheduled on Wednesday, 10/04/22.  Report to [facility] Main Entrance "A" at 10:30 A.M., then check in with the Admitting office.  Call this number if you have problems the morning of surgery:  [phone]   If you have any questions prior to your surgery date call [phone]: Open Monday-Friday 8am-4pm If you experience any cold or flu symptoms such as cough, fever, chills, shortness of breath, etc. between now and your scheduled surgery, please notify us at the above number     Remember:  Do not eat after midnight the night before your surgery  You may drink clear liquids until 9:30am the morning of your surgery.   Clear liquids allowed are: Water, Non-Citrus Juices (without pulp), Carbonated Beverages, Clear Tea, Black Coffee ONLY (NO MILK, CREAM OR POWDERED CREAMER of any kind), and Gatorade    Take these medicines the morning of surgery with A SIP OF WATER:  isosorbide dinitrate (ISORDIL)   IF NEEDED: clonazePAM (KLONOPIN)  traMADol (ULTRAM)   As of today, STOP taking any Aspirin (unless otherwise instructed by your surgeon) Aleve, Naproxen, Ibuprofen, Motrin, Advil, Goody's, BC's, all herbal medications, fish oil, and all vitamins.  Please stop taking Semaglutide-Weight Management (WEGOVY) after 09/24/22.           Do not wear jewelry or makeup. Do not wear lotions, powders, perfumes or deodorant. Do not shave 48 hours prior to surgery.   Do not bring valuables to the hospital. Do not wear nail polish, gel polish, artificial nails, or any other type of covering on natural nails (fingers and toes) If you have artificial nails or gel coating that need to be removed by a nail salon, please have this removed prior to surgery. Artificial nails or gel coating may interfere with anesthesia's ability to adequately monitor your vital signs.  [facility] is not responsible for any belongings or valuables.    Do NOT Smoke  (Tobacco/Vaping)  24 hours prior to your procedure  If you use a CPAP at night, you may bring your mask for your overnight stay.   Contacts, glasses, hearing aids, dentures or partials may not be worn into surgery, please bring cases for these belongings   For patients admitted to the hospital, discharge time will be determined by your treatment team.   Patients discharged the day of surgery will not be allowed to drive home, and someone needs to stay with them for 24 hours.   SURGICAL WAITING ROOM VISITATION Patients having surgery or a procedure may have no more than 2 support people in the waiting area - these visitors may rotate.   Children under the age of 17 must have an adult with them who is not the patient. If the patient needs to stay at the hospital during part of their recovery, the visitor guidelines for inpatient rooms apply. Pre-op nurse will coordinate an appropriate time for 1 support person to accompany patient in pre-op.  This support person may not rotate.   Please refer to [email or web address] for the visitor guidelines for Inpatients (after your surgery is over and you are in a regular room).    Special instructions:    Oral Hygiene is also important to reduce your risk of infection.  Remember - BRUSH YOUR TEETH THE MORNING OF SURGERY WITH YOUR REGULAR TOOTHPASTE   [facility]- Preparing For Surgery  Before surgery, you can play an important role. Because skin is not sterile, your skin needs to be as  free of germs as possible. You can reduce the number of germs on your skin by washing with CHG (chlorahexidine gluconate) Soap before surgery.  CHG is an antiseptic cleaner which kills germs and bonds with the skin to continue killing germs even after washing.     Please do not use if you have an allergy to CHG or antibacterial soaps. If your skin becomes reddened/irritated stop using the CHG.  Do not shave (including  legs and underarms) for at least 48 hours prior to first CHG shower. It is OK to shave your face.  Please follow these instructions carefully.     Shower the NIGHT BEFORE SURGERY and the MORNING OF SURGERY with CHG Soap.   If you chose to wash your hair, wash your hair first as usual with your normal shampoo. After you shampoo, rinse your hair and body thoroughly to remove the shampoo.  Then [employer] and genitals (private parts) with your normal soap and rinse thoroughly to remove soap.  After that Use CHG Soap as you would any other liquid soap. You can apply CHG directly to the skin and wash gently with a scrungie or a clean washcloth.   Apply the CHG Soap to your body ONLY FROM THE NECK DOWN.  Do not use on open wounds or open sores. Avoid contact with your eyes, ears, mouth and genitals (private parts). Wash Face and genitals (private parts)  with your normal soap.   Wash thoroughly, paying special attention to the area where your surgery will be performed.  Thoroughly rinse your body with warm water from the neck down.  DO NOT shower/wash with your normal soap after using and rinsing off the CHG Soap.  Pat yourself dry with a CLEAN TOWEL.  Wear CLEAN PAJAMAS to bed the night before surgery  Place CLEAN SHEETS on your bed the night before your surgery  DO NOT SLEEP WITH PETS.   Day of Surgery: Take a shower with CHG soap. Wear Clean/Comfortable clothing the morning of surgery Do not apply any deodorants/lotions.   Remember to brush your teeth WITH YOUR REGULAR TOOTHPASTE.    If you received a COVID test during your pre-op visit, it is requested that you wear a mask when out in public, stay away from anyone that may not be feeling well, and notify your surgeon if you develop symptoms. If you have been in contact with anyone that has tested positive in the last 10 days, please notify your surgeon.    Please read over the following fact sheets that you were given.

## 2022-09-25 NOTE — Progress Notes (Addendum)
Office Visit Note   Patient: Morgan Molina           Date of Birth: 1976/09/01           MRN: [id number] Visit Date: 09/25/2022              Requested by: Grayce Sessions, NP [address] [place],  [place] [address] PCP: Grayce Sessions, NP   Assessment & Plan: Visit Diagnoses:  1. Spinal stenosis of cervical region   C4-C7 HNP/stenosis  Plan: We will proceed with C4-C7 ACDF is scheduled.  Surgical procedure discussed along with potential rehab/recovery time in great detail.  Risk of pseudoarthrosis also discussed.  Stressed the importance of being compliant with cervical collar use postop.  Advised no driving until she is cleared to come out of the collar which will likely be around 6 to 8 weeks postop.  All questions answered.  I will look for clearance letter.  Follow-Up Instructions: No follow-ups on file.   Orders:  No orders of the defined types were placed in this encounter.  No orders of the defined types were placed in this encounter.     Procedures: No procedures performed   Clinical Data: No additional findings.   Subjective: Chief Complaint  Patient presents with   history and physical    10/04/2022 C4-5, C5-6, C6-7 ACDF    HPI  Review of Systems Patient admits to having occasional lightheadedness and dizziness.  History of syncope.  She has had his evaluated by cardiology.  No current complaints of chest pain, shortness of breath GI/GU issues.  Patient was seen for preop medical clearance and was seen for this last Friday but I do not see an official clearance letter.  Patient states that she was given final clearance by provider.  Objective: Vital Signs: BP (!) 142/83   Pulse 85   Ht 5\' 4"  (1.626 m)   Wt (!) 318 lb (144.2 kg)   BMI 54.58 kg/m   Physical Exam Constitutional:      Appearance: She is obese.  HENT:     Head: Normocephalic and atraumatic.     Nose: Nose normal.  Eyes:     Extraocular Movements: Extraocular  movements intact.  Cardiovascular:     Rate and Rhythm: Normal rate and regular rhythm.     Heart sounds: Normal heart sounds.  Pulmonary:     Effort: Pulmonary effort is normal. No respiratory distress.     Breath sounds: Normal breath sounds.  Neurological:     Mental Status: She is alert and oriented to person, place, and time.  Psychiatric:        Mood and Affect: Mood normal.     Ortho Exam  Specialty Comments:  MRI LUMBAR SPINE WITHOUT CONTRAST   TECHNIQUE: Multiplanar, multisequence MR imaging of the lumbar spine was performed. No intravenous contrast was administered.   COMPARISON:  Prior radiograph from 08/04/2021   FINDINGS: Segmentation: Standard. Lowest well-formed disc space labeled the L5-S1 level.   Alignment: Trace degenerative anterolisthesis of L3 on L4 and L4 on L5. Alignment otherwise normal preservation of the normal lumbar lordosis.   Vertebrae: Vertebral body height maintained without acute or chronic fracture. Bone marrow signal intensity diffusely decreased on T1 weighted imaging, nonspecific, but most commonly related to anemia, smoking, or obesity. Mild reactive endplate change present about the L2-3 interspace. Reactive marrow edema present about the right greater than left L4-5 facets due to facet arthritis. No other abnormal marrow edema.  Mental Status: She is alert and oriented to person, place, and time.  Psychiatric:        Mood and Affect: Mood normal.        Ortho Exam   Specialty Comments:  MRI LUMBAR SPINE WITHOUT CONTRAST   TECHNIQUE: Multiplanar, multisequence MR imaging of the lumbar spine was performed. No intravenous contrast was administered.   COMPARISON:  Prior radiograph from 08/04/2021   FINDINGS: Segmentation: Standard. Lowest well-formed disc space labeled the L5-S1 level.   Alignment: Trace degenerative anterolisthesis of L3 on L4 and L4 on L5. Alignment otherwise normal preservation of the normal lumbar lordosis.   Vertebrae: Vertebral body height maintained without acute or chronic fracture. Bone marrow signal intensity diffusely decreased on T1 weighted imaging, nonspecific, but most commonly related to anemia, smoking, or obesity. Mild reactive endplate change present about the L2-3 interspace. Reactive marrow edema present about the right greater than left L4-5 facets due to facet arthritis. No other abnormal marrow edema.   Conus medullaris and cauda equina: Conus extends to the L1-2 level. Conus and cauda equina appear normal.   Paraspinal and other soft tissues: Paraspinous soft tissues demonstrate no acute finding. Visualized visceral structures unremarkable.   Disc levels:   T11-12: Seen only on sagittal projection. Mild disc bulge with disc desiccation. Bilateral facet hypertrophy. No significant spinal stenosis. Moderate bilateral  foraminal narrowing.   T12-L1: Normal interspace. Mild bilateral facet hypertrophy. No stenosis.   L1-2: Negative interspace. Mild bilateral facet hypertrophy. No stenosis.   L2-3: Degenerative intervertebral disc space narrowing with diffuse disc bulge and disc desiccation. Superimposed biforaminal disc protrusions, right slightly larger than left (series 6, image 15). Superimposed small left subarticular component noted on the left (series 6, image 16). Moderate bilateral facet hypertrophy. Mild prominence of the dorsal epidural fat. Resultant moderate spinal stenosis. Moderate bilateral L2 foraminal narrowing.   L3-4: Trace listhesis. Diffuse disc bulge with disc desiccation. Superimposed right foraminal disc protrusion contacts the exiting right L3 nerve root (series 6, image 19). Moderate bilateral facet hypertrophy. Resultant mild spinal stenosis. Mild left with moderate right L3 foraminal narrowing.   L4-5: Right foraminal to extraforaminal disc protrusion contacts the exiting right L4 nerve root as it courses of the right neural foramen (series 6, image 24). Severe right worse than left facet arthrosis. No significant spinal stenosis. Mild left with moderate right L4 foraminal narrowing.   L5-S1: Negative interspace. Severe left with moderate right facet arthrosis. No significant spinal stenosis. Foramina remain patent.   IMPRESSION: 1. Multifactorial degenerative changes at L2-3 with resultant moderate canal and bilateral L2 foraminal stenosis. 2. Right foraminal to extraforaminal disc protrusions at L3-4 and L4-5, contacting and potentially affecting the exiting right L3 and L4 nerve roots respectively. 3. Moderate to severe lower lumbar facet arthrosis, most pronounced at L4-5 on the right and L5-S1 on the left. Findings could contribute to lower back pain.     Electronically Signed   By: Benjamin  McClintock M.D.   On: 10/03/2021 01:47   Imaging: No results  found.     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   Arthralgia 03/30/2021   Hyperglycemia 03/30/2021     Shortness of breath 03/30/2021   Bilateral chronic serous otitis media 08/15/2020   Eustachian tube dysfunction, bilateral 08/15/2020   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   Ganglion cyst of dorsum of left wrist 10/01/2019   Decreased visual acuity 10/01/2019   PTSD (post-traumatic stress disorder) 09/28/2019   Nipple discharge 04/02/2019   Panic attacks 02/19/2019   Bilateral low back pain 02/19/2019   Seasonal allergies 02/19/2019   Anxiety and depression 01/14/2019   Syncope 01/14/2019   Breast mass, right 01/14/2019   Hypertension          Past Medical History:  Diagnosis Date   Anxiety     Depression     Hypertension     Muscle spasm     PTSD (post-traumatic stress disorder) 09/28/2019   Syncope           Family History  Problem Relation Age of Onset   Cancer Mother     Hypertension Mother     Heart attack Mother     Breast cancer Mother 51   CAD Father     Hypertension Father     Diabetes Father           Past Surgical History:  Procedure Laterality Date   BREAST EXCISIONAL BIOPSY Right     CARPAL TUNNEL RELEASE Left 02/20/2020    Procedure: LEFT CARPAL TUNNEL RELEASE;  Surgeon: Kuzma, Kevin, MD;  Location: [facility];  Service: Orthopedics;  Laterality: Left;   CARPAL TUNNEL RELEASE Right 02/28/2021    Procedure: CARPAL TUNNEL RELEASE;  Surgeon: Kuzma, Kevin, MD;  Location: [facility];  Service: Orthopedics;  Laterality: Right;   CHOLECYSTECTOMY       GANGLION CYST EXCISION Left  02/20/2020    Procedure: LEFT DORSAL GANGLION EXCISION;  Surgeon: Kuzma, Kevin, MD;  Location: [facility];  Service: Orthopedics;  Laterality: Left;   TRIGGER FINGER RELEASE Bilateral 08/29/2021    Procedure: RELEASE TRIGGER FINGER/A-1 PULLEY BILATERAL THUMBS;  Surgeon: Kuzma, Kevin, MD;  Location:  SURGERY CENTER;  Service: Orthopedics;  Laterality: Bilateral;  30 MIN   TUBAL LIGATION        Social History         Occupational History   Occupation: Customer Service  Tobacco Use   Smoking status: Former      Packs/day: 0.10      Types: Cigarettes      Quit date: 10/26/2019      Years since quitting: 2.9   Smokeless tobacco: Never  Vaping Use   Vaping Use: Never used  Substance and Sexual Activity   Alcohol use: Yes      Comment: occasional   Drug use: No   Sexual activity: Not on file        

## 2022-09-26 ENCOUNTER — Other Ambulatory Visit (INDEPENDENT_AMBULATORY_CARE_PROVIDER_SITE_OTHER): Payer: Self-pay | Admitting: Primary Care

## 2022-09-26 ENCOUNTER — Encounter (HOSPITAL_COMMUNITY): Payer: Self-pay

## 2022-09-26 ENCOUNTER — Other Ambulatory Visit: Payer: Self-pay

## 2022-09-26 ENCOUNTER — Encounter (HOSPITAL_COMMUNITY)
Admission: RE | Admit: 2022-09-26 | Discharge: 2022-09-26 | Disposition: A | Discharge status: Home / Self Care | Payer: BC Managed Care – PPO | Source: Ambulatory Visit | Attending: Orthopaedic Surgery | Admitting: Orthopaedic Surgery

## 2022-09-26 VITALS — BP 141/89 | HR 80 | Temp 98.2°F | Resp 17 | Ht 64.0 in | Wt 316.8 lb

## 2022-09-26 DIAGNOSIS — Z01818 Encounter for other preprocedural examination: Secondary | ICD-10-CM | POA: Diagnosis present

## 2022-09-26 HISTORY — DX: Dyspnea, unspecified: R06.00

## 2022-09-26 HISTORY — DX: Headache, unspecified: R51.9

## 2022-09-26 HISTORY — DX: Unspecified osteoarthritis, unspecified site: M19.90

## 2022-09-26 HISTORY — DX: Anemia, unspecified: D64.9

## 2022-09-26 LAB — TYPE AND SCREEN
ABO/RH(D): A POS
Antibody Screen: NEGATIVE

## 2022-09-26 LAB — SURGICAL PCR SCREEN
MRSA, PCR: NEGATIVE
Staphylococcus aureus: NEGATIVE

## 2022-09-26 MED ORDER — IRBESARTAN-HYDROCHLOROTHIAZIDE 150-12.5 MG PO TABS: 1.0000 | ORAL_TABLET | Freq: Every day | ORAL | 0 refills | Status: DC

## 2022-09-26 NOTE — Progress Notes (Signed)
PCP - Efrain Sella, PA Cardiologist - patient denies  PPM/ICD - n/a Device Orders - n/a Rep Notified - n/a  Chest x-ray - 03/15/22 EKG - 10/01/22 Stress Test - denies ECHO - 03/16/22 Cardiac Cath - denies  Sleep Study - denies CPAP - n/a  Fasting Blood Sugar - n/a Checks Blood Sugar _____ times a day- n/a  Last dose of GLP1 agonist-  Instructions were given to stop Wegovy on 09/24/22. Patient states she has med prescribed but has not started yet.    Blood Thinner Instructions: n/a Aspirin Instructions: n/a  ERAS Protcol - Clear liquids until 0930 am day of surgery.  PRE-SURGERY Ensure or G2- Ensure  COVID TEST- n/a   Anesthesia review: Yes. Seen in the ED 03/2022 for chest pain and shortness of breath.  Patient denies shortness of breath, fever, cough and chest pain at PAT appointment   All instructions explained to the patient, with a verbal understanding of the material. Patient agrees to go over the instructions while at home for a better understanding. The opportunity to ask questions was provided.

## 2022-09-26 NOTE — Telephone Encounter (Signed)
Pt has a 90 day supply refill at [facility] but her insurance wont cover it and [facility] will not transfer it / pt needs 90 day supply refill sent to her preferred pharmacy   [id number] - [place], [place] - [address]   / please advise

## 2022-09-26 NOTE — Telephone Encounter (Signed)
After reviewing the recorded call, the patient request irbesartan a 90 day refill to [facility].

## 2022-09-26 NOTE — Telephone Encounter (Signed)
Requested Prescriptions  Pending Prescriptions Disp Refills   irbesartan-hydrochlorothiazide (AVALIDE) 150-12.5 MG tablet 90 tablet 0    Sig: Take 1 tablet by mouth daily.     Cardiovascular: ARB + Diuretic Combos Failed - 09/26/2022 12:16 PM      Failed - Last BP in normal range    BP Readings from Last 1 Encounters:  09/26/22 (!) 141/89         Passed - K in normal range and within 180 days    Potassium  Date Value Ref Range Status  09/21/2022 4.2 3.5 - 5.2 mmol/L Final         Passed - Na in normal range and within 180 days    Sodium  Date Value Ref Range Status  09/21/2022 140 134 - 144 mmol/L Final         Passed - Cr in normal range and within 180 days    Creatinine, Ser  Date Value Ref Range Status  09/21/2022 0.68 0.57 - 1.00 mg/dL Final         Passed - eGFR is 10 or above and within 180 days    GFR calc Af Amer  Date Value Ref Range Status  02/17/2020 >60 >60 mL/min Final   GFR, Estimated  Date Value Ref Range Status  03/16/2022 >60 >60 mL/min Final    Comment:    (NOTE) Calculated using the CKD-EPI Creatinine Equation (2021)    eGFR  Date Value Ref Range Status  09/21/2022 109 >59 mL/min/1.73 Final         Passed - Patient is not pregnant      Passed - Valid encounter within last 6 months    Recent Outpatient Visits           5 days ago Essential hypertension   [place], Michelle P, NP   1 month ago Need for hepatitis C screening test   [facility] Kerin Perna, NP   2 months ago Prediabetes   [place] Kerin Perna, NP   3 months ago Muscle spasm of back   [place] Kerin Perna, NP   4 months ago Anxiety and depression   [place] Kerin Perna, NP       Future Appointments             In 3 weeks Kerin Perna, NP [place]

## 2022-09-26 NOTE — Progress Notes (Addendum)
Surgical Instructions    Your procedure is scheduled on Wednesday, 10/04/22.  Report to [facility] Main Entrance "A" at 10:30 A.M., then check in with the Admitting office.  Call this number if you have problems the morning of surgery:  [phone]   If you have any questions prior to your surgery date call [phone]: Open Monday-Friday 8am-4pm If you experience any cold or flu symptoms such as cough, fever, chills, shortness of breath, etc. between now and your scheduled surgery, please notify us at the above number     Remember:  Do not eat after midnight the night before your surgery  You may drink clear liquids until 9:30am the morning of your surgery.   Clear liquids allowed are: Water, Non-Citrus Juices (without pulp), Carbonated Beverages, Clear Tea, Black Coffee ONLY (NO MILK, CREAM OR POWDERED CREAMER of any kind), and Gatorade   Patient Instructions  The night before surgery:  No food after midnight. ONLY clear liquids after midnight  The day of surgery (if you do NOT have diabetes):  Drink ONE (1) Pre-Surgery Clear Ensure by 09:30 A.M. the morning of surgery. Drink in one sitting. Do not sip.  This drink was given to you during your hospital  pre-op appointment visit.  Nothing else to drink after completing the  Pre-Surgery Clear Ensure.         If you have questions, please contact your surgeon's office.   Take these medicines the morning of surgery with A SIP OF WATER:   IF NEEDED: clonazePAM (KLONOPIN)   As of today, STOP taking any Aspirin (unless otherwise instructed by your surgeon) Aleve, Naproxen, Ibuprofen, Motrin, Advil, Goody's, BC's, all herbal medications, fish oil, and all vitamins.  Please stop taking Semaglutide-Weight Management (WEGOVY) after 09/24/22.           Do not wear jewelry or makeup. Do not wear lotions, powders, perfumes or deodorant. Do not shave 48 hours prior to surgery.   Do not bring valuables to the hospital. Do not wear  nail polish, gel polish, artificial nails, or any other type of covering on natural nails (fingers and toes) If you have artificial nails or gel coating that need to be removed by a nail salon, please have this removed prior to surgery. Artificial nails or gel coating may interfere with anesthesia's ability to adequately monitor your vital signs.  [facility] is not responsible for any belongings or valuables.    Do NOT Smoke (Tobacco/Vaping)  24 hours prior to your procedure  If you use a CPAP at night, you may bring your mask for your overnight stay.   Contacts, glasses, hearing aids, dentures or partials may not be worn into surgery, please bring cases for these belongings   For patients admitted to the hospital, discharge time will be determined by your treatment team.   Patients discharged the day of surgery will not be allowed to drive home, and someone needs to stay with them for 24 hours.   SURGICAL WAITING ROOM VISITATION Patients having surgery or a procedure may have no more than 2 support people in the waiting area - these visitors may rotate.   Children under the age of 28 must have an adult with them who is not the patient. If the patient needs to stay at the hospital during part of their recovery, the visitor guidelines for inpatient rooms apply. Pre-op nurse will coordinate an appropriate time for 1 support person to accompany patient in pre-op.  This support person may  not rotate.   Please refer to [email or web address] for the visitor guidelines for Inpatients (after your surgery is over and you are in a regular room).    Special instructions:    Oral Hygiene is also important to reduce your risk of infection.  Remember - BRUSH YOUR TEETH THE MORNING OF SURGERY WITH YOUR REGULAR TOOTHPASTE   [facility]- Preparing For Surgery  Before surgery, you can play an important role. Because skin is not sterile, your skin needs  to be as free of germs as possible. You can reduce the number of germs on your skin by washing with CHG (chlorahexidine gluconate) Soap before surgery.  CHG is an antiseptic cleaner which kills germs and bonds with the skin to continue killing germs even after washing.     Please do not use if you have an allergy to CHG or antibacterial soaps. If your skin becomes reddened/irritated stop using the CHG.  Do not shave (including legs and underarms) for at least 48 hours prior to first CHG shower. It is OK to shave your face.  Please follow these instructions carefully.     Shower the NIGHT BEFORE SURGERY and the MORNING OF SURGERY with CHG Soap.   If you chose to wash your hair, wash your hair first as usual with your normal shampoo. After you shampoo, rinse your hair and body thoroughly to remove the shampoo.  Then [employer] and genitals (private parts) with your normal soap and rinse thoroughly to remove soap.  After that Use CHG Soap as you would any other liquid soap. You can apply CHG directly to the skin and wash gently with a scrungie or a clean washcloth.   Apply the CHG Soap to your body ONLY FROM THE NECK DOWN.  Do not use on open wounds or open sores. Avoid contact with your eyes, ears, mouth and genitals (private parts). Wash Face and genitals (private parts)  with your normal soap.   Wash thoroughly, paying special attention to the area where your surgery will be performed.  Thoroughly rinse your body with warm water from the neck down.  DO NOT shower/wash with your normal soap after using and rinsing off the CHG Soap.  Pat yourself dry with a CLEAN TOWEL.  Wear CLEAN PAJAMAS to bed the night before surgery  Place CLEAN SHEETS on your bed the night before your surgery  DO NOT SLEEP WITH PETS.   Day of Surgery: Take a shower with CHG soap. Wear Clean/Comfortable clothing the morning of surgery Do not apply any deodorants/lotions.   Remember to brush your teeth WITH YOUR  REGULAR TOOTHPASTE.    If you received a COVID test during your pre-op visit, it is requested that you wear a mask when out in public, stay away from anyone that may not be feeling well, and notify your surgeon if you develop symptoms. If you have been in contact with anyone that has tested positive in the last 10 days, please notify your surgeon.    Please read over the following fact sheets that you were given.

## 2022-09-27 NOTE — Anesthesia Preprocedure Evaluation (Addendum)
Anesthesia Evaluation  Patient identified by MRN, date of birth, ID band Patient awake    Reviewed: Allergy & Precautions, NPO status , Patient's Chart, lab work & pertinent test results  History of Anesthesia Complications Negative for: history of anesthetic complications  Airway Mallampati: II  TM Distance: >3 FB Neck ROM: Full    Dental  (+) Dental Advisory Given   Pulmonary former smoker   breath sounds clear to auscultation       Cardiovascular hypertension, Pt. on medications + angina   Rhythm:Regular  CT Coronary 03/16/22: IMPRESSION: 1. No evidence of CAD, CADRADS = 0. 2. Coronary calcium score of 0. This was 0 percentile for age and sex matched control. 3. Normal coronary origin with right dominance. 4. Moderate signal to noise artifact may mask minimal stenoses, but no significant stenoses or plaque noted on current images.     Echo 03/16/22: IMPRESSIONS   1. Left ventricular ejection fraction, by estimation, is 55 to 60%. Left  ventricular ejection fraction by 2D MOD biplane is 57.4 %. The left  ventricle has normal function. The left ventricle has no regional wall  motion abnormalities. Left ventricular  diastolic parameters were normal.   2. Right ventricular systolic function is normal. The right ventricular  size is normal. There is normal pulmonary artery systolic pressure. The  estimated right ventricular systolic pressure is 23.4 mmHg.   3. The mitral valve is abnormal. Trivial mitral valve regurgitation.   4. Thickening of the right coronary cusp. The aortic valve is tricuspid.  Aortic valve regurgitation is mild to moderate. Aortic valve sclerosis is  present, with no evidence of aortic valve stenosis. Aortic regurgitation  PHT measures 592 msec.   5. The inferior vena cava is normal in size with greater than 50%  respiratory variability, suggesting right atrial pressure of 3 mmHg.     Neuro/Psych   Headaches PSYCHIATRIC DISORDERS Anxiety Depression     Neuromuscular disease    GI/Hepatic negative GI ROS, Neg liver ROS,,,  Endo/Other    Morbid obesity  Renal/GU negative Renal ROS     Musculoskeletal  (+) Arthritis ,    Abdominal   Peds  Hematology  (+) Blood dyscrasia, anemia Lab Results      Component                Value               Date                      WBC                      5.9                 09/21/2022                HGB                      11.0 (L)            09/21/2022                HCT                      34.2                09/21/2022                MCV  81                  09/21/2022                PLT                      441                 09/21/2022              Anesthesia Other Findings   Reproductive/Obstetrics                             Anesthesia Physical Anesthesia Plan  ASA: 3  Anesthesia Plan: General   Post-op Pain Management: Ofirmev IV (intra-op)*   Induction: Intravenous  PONV Risk Score and Plan: 3 and Ondansetron and Dexamethasone  Airway Management Planned: Oral ETT and Video Laryngoscope Planned  Additional Equipment: None  Intra-op Plan:   Post-operative Plan: Extubation in OR  Informed Consent: I have reviewed the patients History and Physical, chart, labs and discussed the procedure including the risks, benefits and alternatives for the proposed anesthesia with the patient or authorized representative who has indicated his/her understanding and acceptance.     Dental advisory given  Plan Discussed with: CRNA  Anesthesia Plan Comments: (PAT note written 09/27/2022 by Shonna Chock, PA-C.  )       Anesthesia Quick Evaluation

## 2022-09-27 NOTE — Progress Notes (Signed)
Anesthesia Chart Review:  Case: [id number] Date/Time: 10/04/22 1215   Procedure: C4-5, C5-6, C6-7 ANTERIOR CERVICAL DISCECTOMY FUSION, ALLOGRAFT, PLATE   Anesthesia type: General   Pre-op diagnosis: cervical stenosis C4-5, C5-6, C6-7   [facility]: [facility] ROOM 07 / [facility]   Surgeons: Eldred Manges, MD       DISCUSSION: Patient is a 46 year old female scheduled for the above procedure.  History includes former smoker (quit 10/26/19), HTN, PTSD, syncope, anemia, dyspnea, cholecystectomy, morbid obesity.   Medial clearance given per PCP Grayce Sessions, NP, dated 09/21/22 following office visit. Of note, she was also admitted in May 2023 for chest pain and presyncope. She was evaluated by Dr. Kristeen Miss. Presyncope thought likely due to anxiety, but murmur also heard. 03/16/22 CCTA showed coronary calcium score of 0 with no evidence of CAD, although "Moderate signal to noise artifact may mask minimal stenoses, but no significant stenoses or plaque noted on current images." 03/16/22 echo showed LVEF 55-60%, no regional wall motion abnormalities, normal RVSF, RVSP 23.4 mmHg, trivial MR, mild-moderate AI.  She was prescribed Wegovy, but had not started yet. PAT RN did instruct her to hold Wegovy on 09/24/22 should she begin therapy.     Anesthesia team to evaluate on the day of surgery.    VS: BP (!) 141/89   Pulse 80   Temp 36.8 C   Resp 17   Ht 5\' 4"  (1.626 m)   Wt (!) 143.7 kg   LMP 09/19/2022 (Exact Date)   SpO2 100%   BMI 54.38 kg/m    PROVIDERS: 09/21/2022, NP is PCP  Grayce Sessions, MD is rheumatologist Pollyann Savoy, MD is neurologist   LABS: Last labs in [facility] include: Lab Results  Component Value Date   WBC 5.9 09/21/2022   HGB 11.0 (L) 09/21/2022   HCT 34.2 09/21/2022   PLT 441 09/21/2022   GLUCOSE 98 09/21/2022   CHOL 182 03/15/2022   TRIG 71 03/15/2022   HDL 77 03/15/2022   LDLCALC 91 03/15/2022   ALT 12 09/21/2022   AST 10 09/21/2022   NA 140  09/21/2022   K 4.2 09/21/2022   CL 101 09/21/2022   CREATININE 0.68 09/21/2022   BUN 9 09/21/2022   CO2 27 09/21/2022   TSH 0.773 06/16/2021   INR CANCELED 09/21/2022   HGBA1C 5.9 07/12/2022     IMAGES: MRI C-spine 08/09/22: IMPRESSION: 1. Age advanced cervical spondylosis with resultant moderate diffuse spinal stenosis at C3-4 through C6-7. Secondary cord flattening at multiple levels without cord signal changes. 2. Multifactorial degenerative changes with resultant multilevel foraminal narrowing as above. Notable findings include moderate bilateral C4 and C5 foraminal stenosis, severe right with moderate left C6 foraminal narrowing, with moderate left worse than right C7 foraminal stenosis.  CTA Chest 03/15/22: IMPRESSION: 1. Negative for pulmonary embolism. 2. Mild enlargement of the main pulmonary artery, which can be seen in the setting of pulmonary hypertension.  CXR 03/15/22: IMPRESSION: 1. Slightly increased prominence of the aortic arch and proximal descending thoracic aorta on the frontal view. This could be related to the projection of the image but cannot exclude pathology in this area. Recommend further characterization with a chest CT with IV contrast. 2. Slightly decreased lung volumes without focal lung disease.    EKG: 09/21/22: NSR   CV: CT Coronary 03/16/22: IMPRESSION: 1. No evidence of CAD, CADRADS = 0. 2. Coronary calcium score of 0. This was 0 percentile for age and sex matched  control. 3. Normal coronary origin with right dominance. 4. Moderate signal to noise artifact may mask minimal stenoses, but no significant stenoses or plaque noted on current images.   Echo 03/16/22: IMPRESSIONS   1. Left ventricular ejection fraction, by estimation, is 55 to 60%. Left  ventricular ejection fraction by 2D MOD biplane is 57.4 %. The left  ventricle has normal function. The left ventricle has no regional wall  motion abnormalities. Left ventricular   diastolic parameters were normal.   2. Right ventricular systolic function is normal. The right ventricular  size is normal. There is normal pulmonary artery systolic pressure. The  estimated right ventricular systolic pressure is 23.4 mmHg.   3. The mitral valve is abnormal. Trivial mitral valve regurgitation.   4. Thickening of the right coronary cusp. The aortic valve is tricuspid.  Aortic valve regurgitation is mild to moderate. Aortic valve sclerosis is  present, with no evidence of aortic valve stenosis. Aortic regurgitation  PHT measures 592 msec.   5. The inferior vena cava is normal in size with greater than 50%  respiratory variability, suggesting right atrial pressure of 3 mmHg.    Cardiac event monitor 02/18/19-03/19/19: Sinus rhythm No significant arrhythmias  Past Medical History:  Diagnosis Date   Anemia    Anxiety    Arthritis    Depression    Dyspnea    Headache    Hypertension    Muscle spasm    PTSD (post-traumatic stress disorder) 09/28/2019   Syncope     Past Surgical History:  Procedure Laterality Date   BREAST EXCISIONAL BIOPSY Right    CARPAL TUNNEL RELEASE Left 02/20/2020   Procedure: LEFT CARPAL TUNNEL RELEASE;  Surgeon: Betha Loa, MD;  Location: [facility];  Service: Orthopedics;  Laterality: Left;   CARPAL TUNNEL RELEASE Right 02/28/2021   Procedure: CARPAL TUNNEL RELEASE;  Surgeon: Betha Loa, MD;  Location: [facility];  Service: Orthopedics;  Laterality: Right;   CHOLECYSTECTOMY     GANGLION CYST EXCISION Left 02/20/2020   Procedure: LEFT DORSAL GANGLION EXCISION;  Surgeon: Betha Loa, MD;  Location: [facility];  Service: Orthopedics;  Laterality: Left;   TRIGGER FINGER RELEASE Bilateral 08/29/2021   Procedure: RELEASE TRIGGER FINGER/A-1 PULLEY BILATERAL THUMBS;  Surgeon: Betha Loa, MD;  Location: [facility];  Service: Orthopedics;  Laterality: Bilateral;  30 MIN   TUBAL  LIGATION      MEDICATIONS:  clonazePAM (KLONOPIN) 1 MG tablet   ibuprofen (ADVIL) 800 MG tablet   irbesartan-hydrochlorothiazide (AVALIDE) 150-12.5 MG tablet   isosorbide dinitrate (ISORDIL) 30 MG tablet   meloxicam (MOBIC) 7.5 MG tablet   Semaglutide-Weight Management (WEGOVY) 1 MG/0.5ML SOAJ   traMADol (ULTRAM) 50 MG tablet   No current facility-administered medications for this encounter.    Shonna Chock, PA-C Surgical Short Stay/Anesthesiology [facility] Phone [phone] [facility] Phone [phone] 09/27/2022 4:27 PM

## 2022-10-04 ENCOUNTER — Encounter (HOSPITAL_COMMUNITY)
Admission: RE | Disposition: A | Discharge status: Home / Self Care | Payer: Self-pay | Source: Ambulatory Visit | Attending: Orthopaedic Surgery

## 2022-10-04 ENCOUNTER — Other Ambulatory Visit: Payer: Self-pay

## 2022-10-04 ENCOUNTER — Encounter (HOSPITAL_COMMUNITY): Payer: Self-pay | Admitting: Orthopaedic Surgery

## 2022-10-04 ENCOUNTER — Observation Stay (HOSPITAL_COMMUNITY)
Admission: RE | Admit: 2022-10-04 | Discharge: 2022-10-05 | Disposition: A | Discharge status: Home / Self Care | Payer: BC Managed Care – PPO | Source: Ambulatory Visit | Attending: Orthopaedic Surgery | Admitting: Orthopaedic Surgery

## 2022-10-04 ENCOUNTER — Ambulatory Visit (HOSPITAL_COMMUNITY): Payer: BC Managed Care – PPO | Admitting: Vascular Surgery

## 2022-10-04 ENCOUNTER — Ambulatory Visit (HOSPITAL_COMMUNITY): Payer: BC Managed Care – PPO | Admitting: Certified Registered Nurse Anesthetist

## 2022-10-04 ENCOUNTER — Ambulatory Visit (HOSPITAL_COMMUNITY): Payer: BC Managed Care – PPO

## 2022-10-04 DIAGNOSIS — I10 Essential (primary) hypertension: Secondary | ICD-10-CM | POA: Diagnosis not present

## 2022-10-04 DIAGNOSIS — Z01818 Encounter for other preprocedural examination: Secondary | ICD-10-CM

## 2022-10-04 DIAGNOSIS — M4802 Spinal stenosis, cervical region: Secondary | ICD-10-CM | POA: Diagnosis present

## 2022-10-04 DIAGNOSIS — M47812 Spondylosis without myelopathy or radiculopathy, cervical region: Principal | ICD-10-CM | POA: Insufficient documentation

## 2022-10-04 DIAGNOSIS — Z79899 Other long term (current) drug therapy: Secondary | ICD-10-CM | POA: Diagnosis not present

## 2022-10-04 DIAGNOSIS — Z87891 Personal history of nicotine dependence: Secondary | ICD-10-CM | POA: Diagnosis not present

## 2022-10-04 HISTORY — PX: ANTERIOR CERVICAL DECOMP/DISCECTOMY FUSION: SHX1161

## 2022-10-04 LAB — ABO/RH: ABO/RH(D): A POS

## 2022-10-04 LAB — POCT PREGNANCY, URINE: Preg Test, Ur: NEGATIVE

## 2022-10-04 SURGERY — ANTERIOR CERVICAL DECOMPRESSION/DISCECTOMY FUSION 3 LEVELS
Anesthesia: General | Site: Neck

## 2022-10-04 MED ORDER — BUPIVACAINE-EPINEPHRINE (PF) 0.5% -1:200000 IJ SOLN
INTRAMUSCULAR | Status: AC
  Filled 2022-10-04: qty 30

## 2022-10-04 MED ORDER — ROCURONIUM BROMIDE 10 MG/ML (PF) SYRINGE
PREFILLED_SYRINGE | INTRAVENOUS | Status: DC | PRN
  Administered 2022-10-04: 30 mg via INTRAVENOUS
  Administered 2022-10-04 (×3): 20 mg via INTRAVENOUS
  Administered 2022-10-04: 100 mg via INTRAVENOUS

## 2022-10-04 MED ORDER — FENTANYL CITRATE (PF) 100 MCG/2ML IJ SOLN
25.0000 ug | INTRAMUSCULAR | Status: DC | PRN
  Administered 2022-10-04 (×2): 50 ug via INTRAVENOUS

## 2022-10-04 MED ORDER — FENTANYL CITRATE (PF) 250 MCG/5ML IJ SOLN
INTRAMUSCULAR | Status: AC
  Filled 2022-10-04: qty 5

## 2022-10-04 MED ORDER — SODIUM CHLORIDE 0.9% FLUSH
3.0000 mL | Freq: Two times a day (BID) | INTRAVENOUS | Status: DC
  Administered 2022-10-04: 3 mL via INTRAVENOUS

## 2022-10-04 MED ORDER — ORAL CARE MOUTH RINSE: 15.0000 mL | Freq: Once | OROMUCOSAL | Status: AC

## 2022-10-04 MED ORDER — OXYCODONE-ACETAMINOPHEN 5-325 MG PO TABS: 1.0000 | ORAL_TABLET | ORAL | 0 refills | Status: DC | PRN

## 2022-10-04 MED ORDER — OXYCODONE HCL 5 MG PO TABS: 5.0000 mg | ORAL_TABLET | Freq: Once | ORAL | Status: DC | PRN

## 2022-10-04 MED ORDER — IRBESARTAN-HYDROCHLOROTHIAZIDE 150-12.5 MG PO TABS: 1.0000 | ORAL_TABLET | Freq: Every day | ORAL | Status: DC

## 2022-10-04 MED ORDER — ACETAMINOPHEN 500 MG PO TABS: 1000.0000 mg | ORAL_TABLET | Freq: Once | ORAL | Status: DC | PRN

## 2022-10-04 MED ORDER — MIDAZOLAM HCL 2 MG/2ML IJ SOLN
INTRAMUSCULAR | Status: AC
  Filled 2022-10-04: qty 2

## 2022-10-04 MED ORDER — BUPIVACAINE-EPINEPHRINE 0.5% -1:200000 IJ SOLN
INTRAMUSCULAR | Status: DC | PRN
  Administered 2022-10-04: 6 mL

## 2022-10-04 MED ORDER — PROPOFOL 10 MG/ML IV BOLUS
INTRAVENOUS | Status: AC
  Filled 2022-10-04: qty 20

## 2022-10-04 MED ORDER — HYDROMORPHONE HCL 1 MG/ML IJ SOLN
0.5000 mg | INTRAMUSCULAR | Status: DC | PRN
  Administered 2022-10-04: 0.5 mg via INTRAVENOUS
  Filled 2022-10-04: qty 0.5

## 2022-10-04 MED ORDER — METHOCARBAMOL 500 MG PO TABS: 500.0000 mg | ORAL_TABLET | Freq: Four times a day (QID) | ORAL | 0 refills | Status: DC | PRN

## 2022-10-04 MED ORDER — METHOCARBAMOL 1000 MG/10ML IJ SOLN: 500.0000 mg | Freq: Four times a day (QID) | INTRAVENOUS | Status: DC | PRN

## 2022-10-04 MED ORDER — DOCUSATE SODIUM 100 MG PO CAPS
100.0000 mg | ORAL_CAPSULE | Freq: Two times a day (BID) | ORAL | Status: DC
  Administered 2022-10-04 – 2022-10-05 (×2): 100 mg via ORAL
  Filled 2022-10-04 (×2): qty 1

## 2022-10-04 MED ORDER — HEMOSTATIC AGENTS (NO CHARGE) OPTIME
TOPICAL | Status: DC | PRN
  Administered 2022-10-04: 1 via TOPICAL

## 2022-10-04 MED ORDER — SUGAMMADEX SODIUM 200 MG/2ML IV SOLN
INTRAVENOUS | Status: DC | PRN
  Administered 2022-10-04: 300 mg via INTRAVENOUS

## 2022-10-04 MED ORDER — CHLORHEXIDINE GLUCONATE 0.12 % MT SOLN
15.0000 mL | Freq: Once | OROMUCOSAL | Status: AC
  Administered 2022-10-04: 15 mL via OROMUCOSAL
  Filled 2022-10-04: qty 15

## 2022-10-04 MED ORDER — PROPOFOL 10 MG/ML IV BOLUS
INTRAVENOUS | Status: DC | PRN
  Administered 2022-10-04: 40 mg via INTRAVENOUS
  Administered 2022-10-04: 200 mg via INTRAVENOUS

## 2022-10-04 MED ORDER — CEFAZOLIN IN SODIUM CHLORIDE 3-0.9 GM/100ML-% IV SOLN
3.0000 g | INTRAVENOUS | Status: AC
  Administered 2022-10-04 (×2): 3 g via INTRAVENOUS
  Filled 2022-10-04: qty 100

## 2022-10-04 MED ORDER — SODIUM CHLORIDE 0.9 % IV SOLN: 250.0000 mL | INTRAVENOUS | Status: DC

## 2022-10-04 MED ORDER — ACETAMINOPHEN 160 MG/5ML PO SOLN: 1000.0000 mg | Freq: Once | ORAL | Status: DC | PRN

## 2022-10-04 MED ORDER — FENTANYL CITRATE (PF) 100 MCG/2ML IJ SOLN
INTRAMUSCULAR | Status: AC
  Filled 2022-10-04: qty 2

## 2022-10-04 MED ORDER — FENTANYL CITRATE (PF) 100 MCG/2ML IJ SOLN: 25.0000 ug | INTRAMUSCULAR | Status: DC | PRN

## 2022-10-04 MED ORDER — ACETAMINOPHEN 10 MG/ML IV SOLN
INTRAVENOUS | Status: DC | PRN
  Administered 2022-10-04: 1000 mg via INTRAVENOUS

## 2022-10-04 MED ORDER — SODIUM CHLORIDE 0.9 % IV SOLN: INTRAVENOUS | Status: DC

## 2022-10-04 MED ORDER — ACETAMINOPHEN 10 MG/ML IV SOLN: 1000.0000 mg | Freq: Once | INTRAVENOUS | Status: DC | PRN

## 2022-10-04 MED ORDER — FENTANYL CITRATE (PF) 250 MCG/5ML IJ SOLN
INTRAMUSCULAR | Status: DC | PRN
  Administered 2022-10-04: 50 ug via INTRAVENOUS
  Administered 2022-10-04: 25 ug via INTRAVENOUS
  Administered 2022-10-04 (×3): 50 ug via INTRAVENOUS
  Administered 2022-10-04: 150 ug via INTRAVENOUS
  Administered 2022-10-04: 50 ug via INTRAVENOUS

## 2022-10-04 MED ORDER — MENTHOL 3 MG MT LOZG
1.0000 | LOZENGE | OROMUCOSAL | Status: DC | PRN
  Administered 2022-10-04: 3 mg via ORAL
  Filled 2022-10-04: qty 9

## 2022-10-04 MED ORDER — ONDANSETRON HCL 4 MG/2ML IJ SOLN: 4.0000 mg | Freq: Four times a day (QID) | INTRAMUSCULAR | Status: DC | PRN

## 2022-10-04 MED ORDER — KETOROLAC TROMETHAMINE 30 MG/ML IJ SOLN: 30.0000 mg | Freq: Once | INTRAMUSCULAR | Status: DC

## 2022-10-04 MED ORDER — IRBESARTAN 150 MG PO TABS
150.0000 mg | ORAL_TABLET | Freq: Every day | ORAL | Status: DC
  Administered 2022-10-04 – 2022-10-05 (×2): 150 mg via ORAL
  Filled 2022-10-04 (×2): qty 1

## 2022-10-04 MED ORDER — 0.9 % SODIUM CHLORIDE (POUR BTL) OPTIME
TOPICAL | Status: DC | PRN
  Administered 2022-10-04: 1000 mL

## 2022-10-04 MED ORDER — ACETAMINOPHEN 325 MG PO TABS
650.0000 mg | ORAL_TABLET | ORAL | Status: DC | PRN
  Administered 2022-10-05: 650 mg via ORAL
  Filled 2022-10-04 (×2): qty 2

## 2022-10-04 MED ORDER — DEXAMETHASONE SODIUM PHOSPHATE 10 MG/ML IJ SOLN
INTRAMUSCULAR | Status: DC | PRN
  Administered 2022-10-04: 10 mg via INTRAVENOUS

## 2022-10-04 MED ORDER — ONDANSETRON HCL 4 MG PO TABS: 4.0000 mg | ORAL_TABLET | Freq: Four times a day (QID) | ORAL | Status: DC | PRN

## 2022-10-04 MED ORDER — MIDAZOLAM HCL 2 MG/2ML IJ SOLN
INTRAMUSCULAR | Status: DC | PRN
  Administered 2022-10-04: 2 mg via INTRAVENOUS

## 2022-10-04 MED ORDER — CLONAZEPAM 0.5 MG PO TABS: 1.0000 mg | ORAL_TABLET | Freq: Two times a day (BID) | ORAL | Status: DC | PRN

## 2022-10-04 MED ORDER — OXYCODONE HCL 5 MG PO TABS: 5.0000 mg | ORAL_TABLET | ORAL | Status: DC | PRN

## 2022-10-04 MED ORDER — ACETAMINOPHEN 10 MG/ML IV SOLN
INTRAVENOUS | Status: AC
  Filled 2022-10-04: qty 100

## 2022-10-04 MED ORDER — KETOROLAC TROMETHAMINE 30 MG/ML IJ SOLN
INTRAMUSCULAR | Status: AC
  Administered 2022-10-04: 30 mg via INTRAVENOUS
  Filled 2022-10-04: qty 1

## 2022-10-04 MED ORDER — METHOCARBAMOL 500 MG PO TABS
500.0000 mg | ORAL_TABLET | Freq: Four times a day (QID) | ORAL | Status: DC | PRN
  Administered 2022-10-04 – 2022-10-05 (×3): 500 mg via ORAL
  Filled 2022-10-04 (×3): qty 1

## 2022-10-04 MED ORDER — LIDOCAINE 2% (20 MG/ML) 5 ML SYRINGE
INTRAMUSCULAR | Status: DC | PRN
  Administered 2022-10-04: 80 mg via INTRAVENOUS

## 2022-10-04 MED ORDER — OXYCODONE HCL 5 MG/5ML PO SOLN: 5.0000 mg | Freq: Once | ORAL | Status: DC | PRN

## 2022-10-04 MED ORDER — PHENOL 1.4 % MT LIQD: 1.0000 | OROMUCOSAL | Status: DC | PRN

## 2022-10-04 MED ORDER — HYDROCHLOROTHIAZIDE 12.5 MG PO TABS
12.5000 mg | ORAL_TABLET | Freq: Every day | ORAL | Status: DC
  Administered 2022-10-04 – 2022-10-05 (×2): 12.5 mg via ORAL
  Filled 2022-10-04 (×2): qty 1

## 2022-10-04 MED ORDER — LACTATED RINGERS IV SOLN: INTRAVENOUS | Status: DC

## 2022-10-04 MED ORDER — ACETAMINOPHEN 650 MG RE SUPP: 650.0000 mg | RECTAL | Status: DC | PRN

## 2022-10-04 MED ORDER — POLYETHYLENE GLYCOL 3350 17 G PO PACK
17.0000 g | PACK | Freq: Every day | ORAL | Status: DC
  Administered 2022-10-04 – 2022-10-05 (×2): 17 g via ORAL
  Filled 2022-10-04 (×2): qty 1

## 2022-10-04 MED ORDER — OXYCODONE HCL 5 MG PO TABS
5.0000 mg | ORAL_TABLET | ORAL | Status: DC | PRN
  Administered 2022-10-04 – 2022-10-05 (×4): 10 mg via ORAL
  Filled 2022-10-04 (×4): qty 2

## 2022-10-04 MED ORDER — SODIUM CHLORIDE 0.9% FLUSH: 3.0000 mL | INTRAVENOUS | Status: DC | PRN

## 2022-10-04 SURGICAL SUPPLY — 65 items
BAG COUNTER SPONGE SURGICOUNT (BAG) ×1 IMPLANT
BENZOIN TINCTURE PRP APPL 2/3 (GAUZE/BANDAGES/DRESSINGS) ×1 IMPLANT
BIT DRILL SM SPINE QC 12 (BIT) IMPLANT
BLADE CLIPPER SURG (BLADE) IMPLANT
BONE CC-ACS 11X14 X8 6D (Bone Implant) ×2 IMPLANT
BONE CC-ACS 11X14X7 6D (Bone Implant) ×2 IMPLANT
BUR ROUND FLUTED 4 SOFT TCH (BURR) ×1 IMPLANT
CHIPS BONE CANC-ACS 11X14X8 6D (Bone Implant) IMPLANT
CHIPS BONE CANC-ACS11X14X7 6D (Bone Implant) IMPLANT
CLSR STERI-STRIP ANTIMIC 1/2X4 (GAUZE/BANDAGES/DRESSINGS) IMPLANT
COLLAR CERV LO CONTOUR FIRM DE (SOFTGOODS) ×1 IMPLANT
COVER MAYO STAND STRL (DRAPES) IMPLANT
COVER SURGICAL LIGHT HANDLE (MISCELLANEOUS) ×1 IMPLANT
DRAPE C-ARM 42X72 X-RAY (DRAPES) ×1 IMPLANT
DRAPE HALF SHEET 40X57 (DRAPES) ×1 IMPLANT
DRAPE MICROSCOPE SLANT 54X150 (MISCELLANEOUS) ×1 IMPLANT
DURAPREP 6ML APPLICATOR 50/CS (WOUND CARE) ×1 IMPLANT
ELECT BLADE 4.0 EZ CLEAN MEGAD (MISCELLANEOUS) ×1
ELECT COATED BLADE 2.86 ST (ELECTRODE) ×1 IMPLANT
ELECT REM PT RETURN 9FT ADLT (ELECTROSURGICAL) ×1
ELECTRODE BLDE 4.0 EZ CLN MEGD (MISCELLANEOUS) IMPLANT
ELECTRODE REM PT RTRN 9FT ADLT (ELECTROSURGICAL) ×1 IMPLANT
EVACUATOR 1/8 PVC DRAIN (DRAIN) ×1 IMPLANT
GAUZE PAD ABD 8X10 STRL (GAUZE/BANDAGES/DRESSINGS) IMPLANT
GAUZE SPONGE 4X4 12PLY STRL (GAUZE/BANDAGES/DRESSINGS) ×1 IMPLANT
GAUZE XEROFORM 1X8 LF (GAUZE/BANDAGES/DRESSINGS) ×1 IMPLANT
GLOVE BIOGEL PI IND STRL 7.5 (GLOVE) IMPLANT
GLOVE BIOGEL PI IND STRL 8 (GLOVE) IMPLANT
GLOVE BIOGEL PI MICRO STRL 6 (GLOVE) IMPLANT
GLOVE PI ORTHO PRO STRL 7.5 (GLOVE) IMPLANT
GOWN STRL REUS W/ TWL LRG LVL3 (GOWN DISPOSABLE) ×1 IMPLANT
GOWN STRL REUS W/ TWL XL LVL3 (GOWN DISPOSABLE) ×1 IMPLANT
GOWN STRL REUS W/TWL 2XL LVL3 (GOWN DISPOSABLE) ×1 IMPLANT
GOWN STRL REUS W/TWL LRG LVL3 (GOWN DISPOSABLE) ×1
GOWN STRL REUS W/TWL XL LVL3 (GOWN DISPOSABLE) ×1
HALTER HD/CHIN CERV TRACTION D (MISCELLANEOUS) ×1 IMPLANT
HEMOSTAT SURGICEL 2X14 (HEMOSTASIS) IMPLANT
KIT BASIN OR (CUSTOM PROCEDURE TRAY) ×1 IMPLANT
KIT TURNOVER KIT B (KITS) ×1 IMPLANT
MANIFOLD NEPTUNE II (INSTRUMENTS) ×1 IMPLANT
NDL 25GX 5/8IN NON SAFETY (NEEDLE) ×1 IMPLANT
NEEDLE 25GX 5/8IN NON SAFETY (NEEDLE) ×1 IMPLANT
NS IRRIG 1000ML POUR BTL (IV SOLUTION) ×1 IMPLANT
PACK ORTHO CERVICAL (CUSTOM PROCEDURE TRAY) ×1 IMPLANT
PAD ARMBOARD 7.5X6 YLW CONV (MISCELLANEOUS) ×2 IMPLANT
PATTIES SURGICAL .5 X.5 (GAUZE/BANDAGES/DRESSINGS) IMPLANT
PIN TEMP FIXATION KIRSCHNER (EXFIX) IMPLANT
PLATE ANT CERV XTEND 3 LV 51 (Plate) IMPLANT
PLATE ANT CERV XTEND 4 LV 63 (Plate) IMPLANT
POSITIONER HEAD DONUT 9IN (MISCELLANEOUS) ×1 IMPLANT
SCREW XTD VAR 4.2 SELF TAP 12 (Screw) IMPLANT
SOL ANTI FOG 6CC (MISCELLANEOUS) IMPLANT
STRIP CLOSURE SKIN 1/2X4 (GAUZE/BANDAGES/DRESSINGS) ×1 IMPLANT
SURGIFLO W/THROMBIN 8M KIT (HEMOSTASIS) IMPLANT
SUT BONE WAX W31G (SUTURE) ×1 IMPLANT
SUT VIC AB 3-0 X1 27 (SUTURE) ×1 IMPLANT
SUT VIC AB 4-0 PS2 18 (SUTURE) IMPLANT
SUT VICRYL 4-0 PS2 18IN ABS (SUTURE) ×2 IMPLANT
SYR 10ML LL (SYRINGE) IMPLANT
SYR 30ML SLIP (SYRINGE) ×1 IMPLANT
SYR BULB EAR ULCER 3OZ GRN STR (SYRINGE) ×1 IMPLANT
TAPE CLOTH SURG 4X10 WHT LF (GAUZE/BANDAGES/DRESSINGS) IMPLANT
TOWEL GREEN STERILE (TOWEL DISPOSABLE) ×1 IMPLANT
TOWEL GREEN STERILE FF (TOWEL DISPOSABLE) ×1 IMPLANT
WATER STERILE IRR 1000ML POUR (IV SOLUTION) ×1 IMPLANT

## 2022-10-04 NOTE — Op Note (Signed)
Preop diagnosis: Multilevel left cervical spondylosis with stenosis C3-C7.  Postop diagnosis same  Procedure: C3-4, C4-5, C5-6, C6-7 anterior cervical discectomy and fusion with allograft and plate (4 level fusion).  Surgeon Rodell Perna, MD  Assistant: Benjiman Core, PA-C medically necessary and present for the entire procedure  Anesthesia General orotracheal +6 cc Marcaine local.  Antibiotics Ancef 3 gm redosed after 3 hours.  Drains 1 Hemovac neck.  EBL: 100 cc  Implants  BONE CC-ACS 11X14 X8 6D - [id number]  Inventory Item: BONE CC-ACS 11X14 X8 6D Serial no.: [id number] Model/Cat no.: [id number]  Implant name: BONE CC-ACS 11X14 X8 6D - [id number] Laterality: N/A Area: Cervical Level 5-6  Manufacturer: [place] Date of Manufacture:   Action: Implanted Number Used: 1   Device Identifier: Device Identifier Type:   BONE CC-ACS 11X14 X8 6D - [id number]  Inventory Item: BONE CC-ACS 11X14 X8 6D Serial no.: [id number] Model/Cat no.: [id number]  Implant name: BONE CC-ACS 11X14 X8 6D - [id number] Laterality: N/A Area: Cervical Level 4-5  Manufacturer: Gerilyn Nestle FNDN Date of Manufacture:   Action: Implanted Number Used: 1   Device Identifier: Device Identifier Type:   BONE CC-ACS [id number] 6D - [id number]  Inventory Item: BONE CC-ACS [id number] 6D Serial no.: [id number] Model/Cat no.: [id number]  Implant name: BONE CC-ACS [id number] 6D - [id number] Laterality: N/A Area: Cervical Level 6-7  Manufacturer: Gerilyn Nestle FNDN Date of Manufacture:   Action: Implanted Number Used: 1   Device Identifier: Device Identifier Type:   SCREW XTD VAR 4.2 SELF TAP 12 - [id number]  Inventory Item: SCREW XTD VAR 4.2 SELF TAP 12 Serial no.: Model/Cat no.: [id number]  Implant name: SCREW XTD VAR 4.2 SELF TAP 12 - [id number] Laterality: N/A Area: Spine Cervical  Manufacturer: [place] Date of Manufacture:   Action: Implanted  Number Used: 10   Device Identifier: Device Identifier Type:   PLATE ANT CERV XTEND 3 LV 51 - [id number]  Inventory Item: PLATE ANT CERV XTEND 3 LV 51 Serial no.: Model/Cat no.: [id number]  Implant name: PLATE ANT CERV XTEND 3 LV 51 - [id number] Laterality: N/A Area: Spine Cervical  Manufacturer: [place] Date of Manufacture:   Action: Implanted and Explanted Number Used: 1   Device Identifier: Device Identifier Type:   PLATE ANT CERV XTEND 4 LV 63 - [id number]  Inventory Item: PLATE ANT CERV XTEND 4 LV 63 Serial no.: Model/Cat no.: [id number]  Implant name: PLATE ANT CERV XTEND 4 LV 63 - [id number] Laterality: N/A Area: Spine Cervical  Manufacturer: [place] Date of Manufacture:   Action: Implanted Number Used: 1   Device Identifier: Device Identifier Type:   BONE CC-ACS [id number] 6D - [id number]   Inventory Item: BONE CC-ACS [id number] 6D Serial no.: [id number] Model/Cat no.: [id number]  Implant name: BONE CC-ACS 17G01V4 6D - [id number] Laterality: N/A Area: Spine Cervical  Manufacturer: Gerilyn Nestle FNDN Date of Manufacture:   Action: Implanted Number Used: 1   Device Identifier: Device Identifier Type:     Complications: Planned 3 level fusion C4-5 C5-6 C6-7.  Additional level C3-4 was fused for patient had stenosis for total of 4 level fusion.  Procedure after induction of general anesthesia with glide scope intubation orotracheal intubation had ultra traction without weights shoulders taped down wrist restraints applied Poppers.  3 g Ancef was given due to the patient's increased BMI.  She was 5 4 and 307 pounds with short neck.  Neck was prepped with DuraPrep area squared with towels sterile skin marker Betadine Steri-Drape  and sterile Mayo stand at the head thyroid sheets and drapes.  Timeout procedure was completed.  Incision was made starting at the midline extending to the left foot and platysma muscle was divided in line with the skin incision.  We  distended down to the level longus Coley large spurs were noted and the long the self-retaining retractor blades had to be used due to the short thick neck.  Initially short 25 needle was placed and upper disc base and it took some time to be able to find the disc base due to anterior spurs overlying the disc base at multiple levels.  Initially crosstable C arm image showed the needle was at C3-4 we slid down 1 to C4-5 and then marked that level after confirming with C arm taking a chunk of disc out after we used a pair rongeurs to remove some anterior spurs.  Multiple pieces of the anterior longitudinal ligament had calcified and some pieces were mobile.  Patient did had wide disc bases but had multilevel disc protrusions with C4-5 with more stenosis on the right and C5-6 and C6-7 with more disc bulge with cord compression on the left.  She also had cord compression at C3-4 and preoperatively had had a discussion with her concerning doing 4 levels versus 3 levels only decided on 3 levels.  With the removing spurs after starting on C4-5 progressing and decompressing the cord using the operative microscope removing posterior longitudinal ligament and spurs trial sizers 8 mm graft was placed.  Instead of moving inferiorly we moved up and proceeded with fusion at the C3-4 level in which patient had spinal stenosis narrowing equally right and left and narrowing to 8.5 mm in AP diameter.  Graft was sized placed 8 mm also cortical cancellous lordotic 6 degrees.  We then progressed down and and completed the next level.  Due to patient's large disc protrusion some portion of the disc backed on the vertebral body we had to take some of the endplate in order to get exposure to get all the disc fragments off the cord.  This left the remaining height of the vertebral body smaller.  ASM millimeter graft was placed at the C5-6 level.  We took spot radiographs after plate was selected adjusted and plate showed her  instrumented from C3-C6.  We adjusted self-retaining retractors then inferiorly exposed the C6-7 level and then completed decompression and fusion at this level using a 7 mm graft.  There was more disc protrusion causing cord compression on the left side at this level.  All levels showed disc protrusion with some compression.  Her most severe level was C5-6.  After decompression placing 7 mm graft at C6/7 we removed the plate and selected a longer plate.  Screws at the top had to be angled cephalad away from the graft using a longer graft would have moved the screws up and other screws were in vertebral body centrally placed in good position.  Spurs have been removed and bur was used to flatten vertebral anterior cortex of the plate with sit down flat.  Once final spot multiple images were taken it was difficult to see the bottom of the plate due to the patient's body habitus despite using oblique images counting down and using different techniques with 2 different radiology technologist.  Once were happy with the position of graft and plate tiny locking screwdriver was used to lock down all screws.  Hemovac placed in and out technique platysma reapproximated with 3-0 Vicryl  subcuticular closure 4-0 Vicryl in the skin.  Below is the summary from her MRI preop  IMPRESSION: 1. Age advanced cervical spondylosis with resultant moderate diffuse spinal stenosis at C3-4 through C6-7. Secondary cord flattening at multiple levels without cord signal changes. 2. Multifactorial degenerative changes with resultant multilevel foraminal narrowing as above. Notable findings include moderate bilateral C4 and C5 foraminal stenosis, severe right with moderate left C6 foraminal narrowing, with moderate left worse than right C7 foraminal stenosis.     Electronically Signed   By: Jeannine Boga M.D.   On: 08/11/2022 05:06 Discussed with patient's husband also patient's parents mother and father additional  level was fused and we reviewed the images.  I gave him a copy of the report documenting stenosis that she had in multiple spot images that we took some with varying quality due to the patient's short neck thick body habitus.  Discussed patient recovering additional level fused.  Additional level fused did have stenosis with some cord compression and we had discussed preoperatively that at some point she might need to have that done in the future anyway.  Patient on the procedure well was neurologically intact and recovering with intact sensation upper and lower extremities and good strength.

## 2022-10-04 NOTE — Progress Notes (Signed)
  Received the patient from PACU. Oriented to the unit and room. Assessed for pain and addressed pain. Placed call bell within reach.

## 2022-10-04 NOTE — H&P (Signed)
Office Visit Note              Patient: Morgan Molina                                           Date of Birth: 09-15-1976                                                    MRN: [id number] Visit Date: 09/25/2022                                                                     Requested by: Kerin Perna, NP [address] [place],  [place] [address] PCP: Kerin Perna, NP     Assessment & Plan: Visit Diagnoses:  1. Spinal stenosis of cervical region   C4-C7 HNP/stenosis   Plan: We will proceed with C4-C7 ACDF is scheduled.  Surgical procedure discussed along with potential rehab/recovery time in great detail.  Risk of pseudoarthrosis also discussed.  Stressed the importance of being compliant with cervical collar use postop.  Advised no driving until she is cleared to come out of the collar which will likely be around 6 to 8 weeks postop.  All questions answered.  I will look for clearance letter.   Follow-Up Instructions: No follow-ups on file.    Orders:  No orders of the defined types were placed in this encounter.   No orders of the defined types were placed in this encounter.        Procedures: No procedures performed     Clinical Data: No additional findings.     Subjective:     Chief Complaint  Patient presents with   history and physical      10/04/2022 C4-5, C5-6, C6-7 ACDF      HPI   Review of Systems Patient admits to having occasional lightheadedness and dizziness.  History of syncope.  She has had his evaluated by cardiology.  No current complaints of chest pain, shortness of breath GI/GU issues.  Patient was seen for preop medical clearance and was seen for this last Friday but I do not see an official clearance letter.  Patient states that she was given final clearance by provider.   Objective: Vital Signs: BP (!) 142/83   Pulse 85   Ht 5\' 4"  (1.626 m)   Wt (!) 318 lb (144.2 kg)   BMI 54.58 kg/m    Physical  Exam Constitutional:      Appearance: She is obese.  HENT:     Head: Normocephalic and atraumatic.     Nose: Nose normal.  Eyes:     Extraocular Movements: Extraocular movements intact.  Cardiovascular:     Rate and Rhythm: Normal rate and regular rhythm.     Heart sounds: Normal heart sounds.  Pulmonary:     Effort: Pulmonary effort is normal. No respiratory distress.     Breath sounds: Normal breath sounds.  Neurological:  Mental Status: She is alert and oriented to person, place, and time.  Psychiatric:        Mood and Affect: Mood normal.        Ortho Exam   Specialty Comments:  MRI LUMBAR SPINE WITHOUT CONTRAST   TECHNIQUE: Multiplanar, multisequence MR imaging of the lumbar spine was performed. No intravenous contrast was administered.   COMPARISON:  Prior radiograph from 08/04/2021   FINDINGS: Segmentation: Standard. Lowest well-formed disc space labeled the L5-S1 level.   Alignment: Trace degenerative anterolisthesis of L3 on L4 and L4 on L5. Alignment otherwise normal preservation of the normal lumbar lordosis.   Vertebrae: Vertebral body height maintained without acute or chronic fracture. Bone marrow signal intensity diffusely decreased on T1 weighted imaging, nonspecific, but most commonly related to anemia, smoking, or obesity. Mild reactive endplate change present about the L2-3 interspace. Reactive marrow edema present about the right greater than left L4-5 facets due to facet arthritis. No other abnormal marrow edema.   Conus medullaris and cauda equina: Conus extends to the L1-2 level. Conus and cauda equina appear normal.   Paraspinal and other soft tissues: Paraspinous soft tissues demonstrate no acute finding. Visualized visceral structures unremarkable.   Disc levels:   T11-12: Seen only on sagittal projection. Mild disc bulge with disc desiccation. Bilateral facet hypertrophy. No significant spinal stenosis. Moderate bilateral  foraminal narrowing.   T12-L1: Normal interspace. Mild bilateral facet hypertrophy. No stenosis.   L1-2: Negative interspace. Mild bilateral facet hypertrophy. No stenosis.   L2-3: Degenerative intervertebral disc space narrowing with diffuse disc bulge and disc desiccation. Superimposed biforaminal disc protrusions, right slightly larger than left (series 6, image 15). Superimposed small left subarticular component noted on the left (series 6, image 16). Moderate bilateral facet hypertrophy. Mild prominence of the dorsal epidural fat. Resultant moderate spinal stenosis. Moderate bilateral L2 foraminal narrowing.   L3-4: Trace listhesis. Diffuse disc bulge with disc desiccation. Superimposed right foraminal disc protrusion contacts the exiting right L3 nerve root (series 6, image 19). Moderate bilateral facet hypertrophy. Resultant mild spinal stenosis. Mild left with moderate right L3 foraminal narrowing.   L4-5: Right foraminal to extraforaminal disc protrusion contacts the exiting right L4 nerve root as it courses of the right neural foramen (series 6, image 24). Severe right worse than left facet arthrosis. No significant spinal stenosis. Mild left with moderate right L4 foraminal narrowing.   L5-S1: Negative interspace. Severe left with moderate right facet arthrosis. No significant spinal stenosis. Foramina remain patent.   IMPRESSION: 1. Multifactorial degenerative changes at L2-3 with resultant moderate canal and bilateral L2 foraminal stenosis. 2. Right foraminal to extraforaminal disc protrusions at L3-4 and L4-5, contacting and potentially affecting the exiting right L3 and L4 nerve roots respectively. 3. Moderate to severe lower lumbar facet arthrosis, most pronounced at L4-5 on the right and L5-S1 on the left. Findings could contribute to lower back pain.     Electronically Signed   By: Jeannine Boga M.D.   On: 10/03/2021 01:47   Imaging: No results  found.     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   Ganglion cyst of dorsum of left wrist 10/01/2019   Decreased visual acuity 10/01/2019   PTSD (post-traumatic stress disorder) 09/28/2019   Nipple discharge 04/02/2019   Panic attacks 02/19/2019   Bilateral low back pain 02/19/2019   Seasonal allergies 02/19/2019   Anxiety and depression 01/14/2019   Syncope 01/14/2019   Breast mass, right 01/14/2019   Hypertension          Past Medical History:  Diagnosis Date   Anxiety     Depression     Hypertension     Muscle spasm     PTSD (post-traumatic stress disorder) 09/28/2019   Syncope           Family History  Problem Relation Age of Onset   Cancer Mother     Hypertension Mother     Heart attack Mother     Breast cancer Mother 57   CAD Father     Hypertension Father     Diabetes Father           Past Surgical History:  Procedure Laterality Date   BREAST EXCISIONAL BIOPSY Right     CARPAL TUNNEL RELEASE Left 02/20/2020    Procedure: LEFT CARPAL TUNNEL RELEASE;  Surgeon: Leanora Cover, MD;  Location: [facility];  Service: Orthopedics;  Laterality: Left;   CARPAL TUNNEL RELEASE Right 02/28/2021    Procedure: CARPAL TUNNEL RELEASE;  Surgeon: Leanora Cover, MD;  Location: [facility];  Service: Orthopedics;  Laterality: Right;   CHOLECYSTECTOMY       GANGLION CYST EXCISION Left  02/20/2020    Procedure: LEFT DORSAL GANGLION EXCISION;  Surgeon: Leanora Cover, MD;  Location: [facility];  Service: Orthopedics;  Laterality: Left;   TRIGGER FINGER RELEASE Bilateral 08/29/2021    Procedure: RELEASE TRIGGER FINGER/A-1 PULLEY BILATERAL THUMBS;  Surgeon: Leanora Cover, MD;  Location: [facility];  Service: Orthopedics;  Laterality: Bilateral;  30 MIN   TUBAL LIGATION        Social History         Occupational History   Occupation: Therapist, art  Tobacco Use   Smoking status: Former      Packs/day: 0.10      Types: Cigarettes      Quit date: 10/26/2019      Years since quitting: 2.9   Smokeless tobacco: Never  Vaping Use   Vaping Use: Never used  Substance and Sexual Activity   Alcohol use: Yes      Comment: occasional   Drug use: No   Sexual activity: Not on file

## 2022-10-04 NOTE — Progress Notes (Signed)
1720 Dr. Corky Sox for PACU orders 1723 Dr. Ophelia Charter to bedside for eval.

## 2022-10-04 NOTE — Interval H&P Note (Signed)
History and Physical Interval Note:  10/04/2022 12:16 PM  Morgan Molina  has presented today for surgery, with the diagnosis of cervical stenosis C4-5, C5-6, C6-7.  The various methods of treatment have been discussed with the patient and family. After consideration of risks, benefits and other options for treatment, the patient has consented to  Procedure(s): C4-5, C5-6, C6-7 ANTERIOR CERVICAL DISCECTOMY FUSION, ALLOGRAFT, PLATE (N/A) as a surgical intervention.  The patient's history has been reviewed, patient examined, no change in status, stable for surgery.  I have reviewed the patient's chart and labs.  Questions were answered to the patient's satisfaction.     Eldred Manges

## 2022-10-04 NOTE — Anesthesia Procedure Notes (Signed)
Procedure Name: Intubation Date/Time: 10/04/2022 12:52 PM  Performed by: Darletta Moll, CRNAPre-anesthesia Checklist: Patient identified, Emergency Drugs available, Suction available and Patient being monitored Patient Re-evaluated:Patient Re-evaluated prior to induction Oxygen Delivery Method: Circle system utilized Preoxygenation: Pre-oxygenation with 100% oxygen Induction Type: IV induction Ventilation: Oral airway inserted - appropriate to patient size and Two handed mask ventilation required Laryngoscope Size: Mac and 3 Grade View: Grade I Tube type: Oral Tube size: 7.0 mm Number of attempts: 1 Airway Equipment and Method: Stylet and Oral airway Placement Confirmation: ETT inserted through vocal cords under direct vision, positive ETCO2 and breath sounds checked- equal and bilateral Secured at: 21 cm Tube secured with: Tape Dental Injury: Teeth and Oropharynx as per pre-operative assessment

## 2022-10-04 NOTE — Progress Notes (Signed)
Attempted to call report, unable to take report at his time.

## 2022-10-04 NOTE — Transfer of Care (Signed)
Immediate Anesthesia Transfer of Care Note  Patient: Morgan Molina  Procedure(s) Performed: CERVICAL THREE-FOUR, CERVICAL FOUR-FIVE, CERVICAL FIVE-SIX, CERVICAL SIX-SEVEN, ANTERIOR CERVICAL DISCECTOMY FUSION, ALLOGRAFT, PLATE (Neck)  Patient Location: PACU  Anesthesia Type:General  Level of Consciousness: awake, drowsy, and patient cooperative  Airway & Oxygen Therapy: Patient Spontanous Breathing and Patient connected to face mask oxygen  Post-op Assessment: Report given to RN and Post -op Vital signs reviewed and stable  Post vital signs: Reviewed and stable  Last Vitals:  Vitals Value Taken Time  BP 167/97 10/04/22 1708  Temp    Pulse 97 10/04/22 1711  Resp 21 10/04/22 1711  SpO2 99 % 10/04/22 1711  Vitals shown include unvalidated device data.  Last Pain:  Vitals:   10/04/22 1101  TempSrc:   PainSc: 7          Complications: No notable events documented.

## 2022-10-04 NOTE — Progress Notes (Signed)
Orthopedic Tech Progress Note Patient Details:  Morgan Molina 28-Jun-1976 [id number]  Ortho Devices Type of Ortho Device: Soft collar Ortho Device/Splint Interventions: Ordered   Post Interventions Instructions Provided: Adjustment of device, Care of device SOFT collar dropped off per request of RN. Darleen Crocker 10/04/2022, 7:08 PM

## 2022-10-05 ENCOUNTER — Telehealth: Payer: Self-pay | Admitting: Orthopaedic Surgery

## 2022-10-05 ENCOUNTER — Encounter (HOSPITAL_COMMUNITY): Payer: Self-pay | Admitting: Orthopaedic Surgery

## 2022-10-05 DIAGNOSIS — M47812 Spondylosis without myelopathy or radiculopathy, cervical region: Secondary | ICD-10-CM | POA: Diagnosis not present

## 2022-10-05 NOTE — Plan of Care (Signed)
Pt given D/C instructions with verbal understanding. Rx's were sent to the pharmacy by MD. Pt's incision is clean and dry with no sign of infection. Pt's IV was removed prior to D/C. Pt received RW and 3-n-1 from [employer] per MD order. Pt D/C'd home via wheelchair per MD order. Pt is stable @ D/C and has no other needs at this time. Rylon Poitra, RN  

## 2022-10-05 NOTE — Evaluation (Signed)
Physical Therapy Evaluation Patient Details Name: Morgan Molina MRN: [id number] DOB: [birth year] Today's Date: 10/05/2022  History of Present Illness  Pt is a 46 y/o female s/p C3-7 ACDF. PMH includes but is not limited to HTN, anxiety, depression, PTSD, muscle spasms, syncope.   Clinical Impression  Pt admitted with above. Pt educated on cervical precautions and demonstrated good carry over functionally. Pt reports she will be sleeping in a recliner. Pt able to safely amb with RW and negotiate 5 steps to enter home. Pt with no further acute PT needs. PT SIGNING OFF. Please re-consult if needed in future.       Recommendations for follow up therapy are one component of a multi-disciplinary discharge planning process, led by the attending physician.  Recommendations may be updated based on patient status, additional functional criteria and insurance authorization.  Follow Up Recommendations No PT follow up      Assistance Recommended at Discharge Intermittent Supervision/Assistance  Patient can return home with the following       Equipment Recommendations Rolling walker (2 wheels);BSC/3in1 (wide RW)  Recommendations for Other Services       Functional Status Assessment Patient has had a recent decline in their functional status and demonstrates the ability to make significant improvements in function in a reasonable and predictable amount of time.     Precautions / Restrictions Precautions Precautions: Other (comment);Fall Precaution Comments: Cervical Required Braces or Orthoses: Cervical Brace Cervical Brace: Soft collar;At all times Restrictions Weight Bearing Restrictions: No      Mobility  Bed Mobility Overal bed mobility: Needs Assistance Bed Mobility: Rolling, Sidelying to Sit Rolling: Min guard Sidelying to sit: Min guard       General bed mobility comments: pt reports "I ordered a recliner to sleep in", HOB elevated and pt used bed rail to roll to the R and  bring self to EOB    Transfers Overall transfer level: Needs assistance Equipment used: Rolling walker (2 wheels) Transfers: Sit to/from Stand, Bed to chair/wheelchair/BSC Sit to Stand: Supervision, From elevated surface   Step pivot transfers: Supervision, Modified independent (Device/Increase time)       General transfer comment: verbal cues to not pull up on walker    Ambulation/Gait Ambulation/Gait assistance: Supervision Gait Distance (Feet): 100 Feet Assistive device: Rolling walker (2 wheels) Gait Pattern/deviations: Step-through pattern, Decreased stride length Gait velocity: slow Gait velocity interpretation: 1.31 - 2.62 ft/sec, indicative of limited community ambulator   General Gait Details: verbal cues to stand upright and not lean on walker, attempted to ambulate without RW however pt prefered RW  Stairs Stairs: Yes Stairs assistance: Min guard Stair Management: One rail Left, Step to pattern, Sideways Number of Stairs: 5 General stair comments: pt with good technique, no LOB  Wheelchair Mobility    Modified Rankin (Stroke Patients Only)       Balance Overall balance assessment: Mild deficits observed, not formally tested                                           Pertinent Vitals/Pain Pain Assessment Pain Assessment: 0-10 Pain Score: 8  Faces Pain Scale: Hurts little more Pain Location: Cervical/neck Pain Descriptors / Indicators: Grimacing, Guarding    Home Living Family/patient expects to be discharged to:: Private residence Living Arrangements: Spouse/significant other Available Help at Discharge: Family Type of Home: House Home Access: Stairs to enter Entrance  Stairs-Rails: Left Entrance Stairs-Number of Steps: 4   Home Layout: One level Home Equipment: Grab bars - tub/shower      Prior Function Prior Level of Function : Independent/Modified Independent             Mobility Comments: Indepdendent ADLs  Comments: Pt reports independence w/ ADL's     Hand Dominance   Dominant Hand: Right    Extremity/Trunk Assessment   Upper Extremity Assessment Upper Extremity Assessment: Overall WFL for tasks assessed    Lower Extremity Assessment Lower Extremity Assessment: Generalized weakness    Cervical / Trunk Assessment Cervical / Trunk Assessment: Neck Surgery  Communication   Communication: No difficulties  Cognition Arousal/Alertness: Awake/alert Behavior During Therapy: WFL for tasks assessed/performed Overall Cognitive Status: Within Functional Limits for tasks assessed                                          General Comments General comments (skin integrity, edema, etc.): VSS, soft collar in place    Exercises     Assessment/Plan    PT Assessment Patient does not need any further PT services  PT Problem List Decreased balance;Decreased mobility       PT Treatment Interventions      PT Goals (Current goals can be found in the Care Plan section)  Acute Rehab PT Goals Patient Stated Goal: rest PT Goal Formulation: All assessment and education complete, DC therapy    Frequency       Co-evaluation               AM-PAC PT "6 Clicks" Mobility  Outcome Measure Help needed turning from your back to your side while in a flat bed without using bedrails?: None Help needed moving from lying on your back to sitting on the side of a flat bed without using bedrails?: None Help needed moving to and from a bed to a chair (including a wheelchair)?: None Help needed standing up from a chair using your arms (e.g., wheelchair or bedside chair)?: A Little Help needed to walk in hospital room?: A Little Help needed climbing 3-5 steps with a railing? : A Little 6 Click Score: 21    End of Session Equipment Utilized During Treatment: Cervical collar Activity Tolerance: Patient tolerated treatment well Patient left:  (hand off patient to OT to go into  bathroom) Nurse Communication: Mobility status PT Visit Diagnosis: Unsteadiness on feet (R26.81);Difficulty in walking, not elsewhere classified (R26.2)    Time: 9038-3338 PT Time Calculation (min) (ACUTE ONLY): 14 min   Charges:   PT Evaluation $PT Eval Low Complexity: 1 Low          Lewis Shock, PT, DPT Acute Rehabilitation Services Secure chat preferred Office #: [phone]   Iona Hansen 10/05/2022, 10:51 AM

## 2022-10-05 NOTE — Progress Notes (Addendum)
Patient ID: Morgan Molina, female   DOB: Sep 18, 1976, 46 y.o.   MRN: [id number]   Subjective: 1 Day Post-Op Procedure(s) (LRB): CERVICAL THREE-FOUR, CERVICAL FOUR-FIVE, CERVICAL FIVE-SIX, CERVICAL SIX-SEVEN, ANTERIOR CERVICAL DISCECTOMY FUSION, ALLOGRAFT, PLATE (N/A) Patient reports pain as moderate.  Neck pain and some shoulder pain. Sensation intact to UE and legs. Ambulated to BR and in hall. She request folding walker due to her LBP.   Objective: Vital signs in last 24 hours: Temp:  [97.5 F (36.4 C)-99.3 F (37.4 C)] 98.2 F (36.8 C) (11/30 0710) Pulse Rate:  [71-97] 74 (11/30 0710) Resp:  [12-22] 18 (11/30 0710) BP: (143-176)/(68-110) 149/87 (11/30 0710) SpO2:  [96 %-100 %] 100 % (11/30 0710) Weight:  [143.3 kg] 143.3 kg (11/29 1051)  Intake/Output from previous day: 11/29 0701 - 11/30 0700 In: 1600 [I.V.:1500; IV Piggyback:100] Out: 160 [Drains:60; Blood:100] Intake/Output this shift: No intake/output data recorded.  No results for input(s): "HGB" in the last 72 hours. No results for input(s): "WBC", "RBC", "HCT", "PLT" in the last 72 hours. No results for input(s): "NA", "K", "CL", "CO2", "BUN", "CREATININE", "GLUCOSE", "CALCIUM" in the last 72 hours. No results for input(s): "LABPT", "INR" in the last 72 hours.  Neurologically intactUE and LE sensation and strength normal  DG Cervical Spine 2 or 3 views  Result Date: 10/04/2022 CLINICAL DATA:  Intraoperative fluoroscopy C3 through C6 ACDF. EXAM: CERVICAL SPINE - 2-3 VIEW COMPARISON:  Cervical spine radiographs 06/02/2021 FINDINGS: Images were performed intraoperatively without the presence of a radiologist. The patient is undergoing C3 through C7 anterior plate and screw fixation and associated discectomies with placement of intervertebral disc spacers. On initial images there appears to be placement of a C3 through C6 anterior plate. On subsequent images there is a C3 through C7 anterior plate with associated fixation  screws. Total fluoroscopy images: 15 Total fluoroscopy time: 43 seconds Total dose: Radiation Exposure Index (as provided by the fluoroscopic device): 17.54 mGy air Kerma Please see intraoperative findings for further detail. IMPRESSION: Intraoperative fluoroscopy for C3 through C7 ACDF. Electronically Signed   By: Neita Garnet M.D.   On: 10/04/2022 17:52   DG C-Arm 1-60 Min-No Report  Result Date: 10/04/2022 Fluoroscopy was utilized by the requesting physician.  No radiographic interpretation.   DG C-Arm 1-60 Min-No Report  Result Date: 10/04/2022 Fluoroscopy was utilized by the requesting physician.  No radiographic interpretation.   DG C-Arm 1-60 Min-No Report  Result Date: 10/04/2022 Fluoroscopy was utilized by the requesting physician.  No radiographic interpretation.   DG C-Arm 1-60 Min-No Report  Result Date: 10/04/2022 Fluoroscopy was utilized by the requesting physician.  No radiographic interpretation.    Assessment/Plan: 1 Day Post-Op Procedure(s) (LRB): CERVICAL THREE-FOUR, CERVICAL FOUR-FIVE, CERVICAL FIVE-SIX, CERVICAL SIX-SEVEN, ANTERIOR CERVICAL DISCECTOMY FUSION, ALLOGRAFT, PLATE (N/A) Plan:  PT this AM OT then home this AM. Dan Humphreys for home   Eldred Manges 10/05/2022, 8:05 AM

## 2022-10-05 NOTE — Telephone Encounter (Signed)
Patient called in stating she called the pharmacy to pick up pain medications and muscle spasm meds and she was told her insurance company is not going to cover the pain medication and the hospital advised she should not leave without knowing she has medication, please advise but patient would like a call to her first so she can go over what she is allergic to.

## 2022-10-05 NOTE — Telephone Encounter (Signed)
Received prior auth request through Cover My Meds. Medication approved. [facility] [employer] [facility] PA Case ID: [id number] - Rx #: [id number] Need help? Call us at [phone] Outcome Approvedtoday Your PA request has been approved. Additional information will be provided in the approval communication. (Message 1145) Drug oxyCODONE-Acetaminophen 5-325MG  tablets Form [employer] Electronic PA Form (2017 NCPDP) Original Claim Info 75 MAX 7 DS PER90 DAYS THEN PA. PA REQ [id number] REQUIRES PRIOR AUTHORIZATION

## 2022-10-05 NOTE — Discharge Instructions (Signed)
Your collar stays on at all times.  Use the EXTRA collar when you get ready to shower wrapped extra collar with Saran wrap and then remove the dry collar and apply the wound wrapped with Saran wrap you can take a shower or wash her hair dry off and then reapply the dry collar without moving her neck.  See Dr. Ophelia Charter in a week.  You will do better if you are in a beachchair recliner position.  Stay with liquids and soft foods since you should have problems swallowing for short period of time.  Pain medications been sent to pharmacy.

## 2022-10-05 NOTE — Evaluation (Signed)
Occupational Therapy Evaluation Patient Details Name: Morgan Molina MRN: [id number] DOB: [birth year] Today's Date: 10/05/2022   History of Present Illness Pt is a 46 y/o female s/p C3-7 ACDF. PMH includes but is not limited to HTN, anxiety, depression, PTSD, muscle spasms, syncope.   Clinical Impression   Pt admitted as above, lives with family/spouse in 1 story home. She was educated in roll of OT, discussed dressing techniques, cervial collar use, shower transfers, home set up, a/e and ADL's. Pt is currently set-up/supervision for UB/LB dressing and Mod I - supervision for functional transfers related to ADL's. plans to d/c home later today with PRN assist from family/spouse. Recommend 3:1 for home use as pt reports toilet is low. No further OT needs at this time.     Recommendations for follow up therapy are one component of a multi-disciplinary discharge planning process, led by the attending physician.  Recommendations may be updated based on patient status, additional functional criteria and insurance authorization.   Follow Up Recommendations  No OT follow up     Assistance Recommended at Discharge PRN  Patient can return home with the following A little help with walking and/or transfers;A little help with bathing/dressing/bathroom    Functional Status Assessment  Patient has not had a recent decline in their functional status  Equipment Recommendations  BSC/3in1    Recommendations for Other Services       Precautions / Restrictions Precautions Precautions: Other (comment);Fall Precaution Comments: Cervical Required Braces or Orthoses: Cervical Brace Cervical Brace: Hard collar;Soft collar;At all times Restrictions Weight Bearing Restrictions: No      Mobility Bed Mobility   General bed mobility comments: Pt received up with PT    Transfers Overall transfer level: Needs assistance Equipment used: Rolling walker (2 wheels) Transfers: Sit to/from Stand, Bed  to chair/wheelchair/BSC Sit to Stand: Supervision, From elevated surface     Step pivot transfers: Supervision, Modified independent (Device/Increase time)     General transfer comment: No hands on assist needed      Balance Overall balance assessment: Mild deficits observed, not formally tested     ADL either performed or assessed with clinical judgement   ADL Overall ADL's : Needs assistance/impaired Eating/Feeding: Modified independent;Sitting   Grooming: Sitting;Standing;Supervision/safety   Upper Body Bathing: Supervision/ safety;Sitting;Standing   Lower Body Bathing: Supervison/ safety;Sit to/from stand;Sitting/lateral leans   Upper Body Dressing : Set up;Sitting;Modified independent Upper Body Dressing Details (indicate cue type and reason): Pt overall Mod I UB dressing sitting up at EOB Lower Body Dressing: Supervision/safety;Modified independent;Sit to/from stand;Sitting/lateral leans Lower Body Dressing Details (indicate cue type and reason): Pt sitting at EOB for LB dressing w/o a/e use. 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 "I know how to do thisRadiographer, therapeutic (2 wheels);Ambulation;Modified Diplomatic Services operational officer Details (indicate cue type and reason): Pt reports that she has l;ow toilet at home, she would benefit from 3:1 for use over toilet at d/c Toileting- Clothing Manipulation and Hygiene: Sitting/lateral lean;Sit to/from stand;Modified independent;Supervision/safety   Tub/ Engineer, structural: Psychologist, counselling;Ambulation;Rolling walker (2 wheels) Tub/Shower Transfer Details (indicate cue type and reason): Discussed shower transfer at d/c - pt states that spouse can assist PRN when shes able to shower Functional mobility during ADLs: Rolling walker (2 wheels);Modified independent;Supervision/safety General ADL Comments: Pt was educated in roll of OT, discussed dressing techniques, cervial collar use, shower  transfers, home set up, a/e and ADL's. Pt plans to d/c home later today with PRN  assist from family/spouse. No further OT needs at this time.     Vision Patient Visual Report: No change from baseline Vision Assessment?: No apparent visual deficits            Pertinent Vitals/Pain Pain Assessment Pain Assessment: Faces Faces Pain Scale: Hurts little more Pain Location: Cervical/neck Pain Descriptors / Indicators: Grimacing, Guarding Pain Intervention(s): Limited activity within patient's tolerance, Monitored during session, Repositioned     Hand Dominance Right   Extremity/Trunk Assessment Upper Extremity Assessment Upper Extremity Assessment: Overall WFL for tasks assessed   Lower Extremity Assessment Lower Extremity Assessment: Defer to PT evaluation       Communication Communication Communication: No difficulties   Cognition Arousal/Alertness: Awake/alert Behavior During Therapy: WFL for tasks assessed/performed Overall Cognitive Status: Within Functional Limits for tasks assessed               Home Living Family/patient expects to be discharged to:: Private residence Living Arrangements: Spouse/significant other Available Help at Discharge: Family Type of Home: House   Home Layout: One level   Bathroom Shower/Tub: Producer, television/film/video: Standard     Home Equipment: Grab bars - tub/shower      Prior Functioning/Environment Prior Level of Function : Independent/Modified Independent   Mobility Comments: Indepdendent ADLs Comments: Pt reports independence w/ ADL's        OT Problem List: Pain;Decreased activity tolerance         OT Goals(Current goals can be found in the care plan section) Acute Rehab OT Goals Patient Stated Goal: Go home later today OT Goal Formulation: All assessment and education complete, DC therapy Time For Goal Achievement: 10/05/22 Potential to Achieve Goals: Good  OT Frequency:         AM-PAC OT "6  Clicks" Daily Activity     Outcome Measure Help from another person eating meals?: None Help from another person taking care of personal grooming?: A Little Help from another person toileting, which includes using toliet, bedpan, or urinal?: A Little Help from another person bathing (including washing, rinsing, drying)?: A Little Help from another person to put on and taking off regular upper body clothing?: None Help from another person to put on and taking off regular lower body clothing?: None 6 Click Score: 21   End of Session Equipment Utilized During Treatment: Rolling walker (2 wheels) Nurse Communication: Mobility status;Other (comment) (Needs 3:1)  Activity Tolerance: Patient tolerated treatment well Patient left: in bed;with call bell/phone within reach;with family/visitor present  OT Visit Diagnosis: Pain;Other abnormalities of gait and mobility (R26.89) Pain - part of body:  (neck/cervical spine)                Time: 0998-3382 OT Time Calculation (min): 22 min Charges:  OT General Charges $OT Visit: 1 Visit OT Evaluation $OT Eval Low Complexity: 1 Low  Marciana Uplinger Beth Dixon, OTR/L 10/05/2022, 9:38 AM

## 2022-10-05 NOTE — Telephone Encounter (Signed)
I called patient and advised. 

## 2022-10-06 NOTE — Discharge Summary (Signed)
Patient ID: Morgan Molina MRN: [id number] DOB/AGE: [birth year] 46 y.o.  Admit date: 10/04/2022 Discharge date: 10/05/2022  Admission Diagnoses:  Principal Problem:   Cervical spinal stenosis   Discharge Diagnoses:  Principal Problem:   Cervical spinal stenosis  status post Procedure(s): CERVICAL THREE-FOUR, CERVICAL FOUR-FIVE, CERVICAL FIVE-SIX, CERVICAL SIX-SEVEN, ANTERIOR CERVICAL DISCECTOMY FUSION, ALLOGRAFT, PLATE  Past Medical History:  Diagnosis Date   Anemia    Anxiety    Arthritis    Depression    Dyspnea    Headache    Hypertension    Muscle spasm    PTSD (post-traumatic stress disorder) 09/28/2019   Syncope     Surgeries: Procedure(s): CERVICAL THREE-FOUR, CERVICAL FOUR-FIVE, CERVICAL FIVE-SIX, CERVICAL SIX-SEVEN, ANTERIOR CERVICAL DISCECTOMY FUSION, ALLOGRAFT, PLATE on 02/40/9735   Consultants:   Discharged Condition: Improved  Hospital Course: Morgan Molina is an 46 y.o. female who was admitted 10/04/2022 for operative treatment of Cervical spinal stenosis. Patient failed conservative treatments (please see the history and physical for the specifics) and had severe unremitting pain that affects sleep, daily activities and work/hobbies. After pre-op clearance, the patient was taken to the operating room on 10/04/2022 and underwent  Procedure(s): CERVICAL THREE-FOUR, CERVICAL FOUR-FIVE, CERVICAL FIVE-SIX, CERVICAL SIX-SEVEN, ANTERIOR CERVICAL DISCECTOMY FUSION, ALLOGRAFT, PLATE.    Patient was given perioperative antibiotics:  Anti-infectives (From admission, onward)    Start     Dose/Rate Route Frequency Ordered Stop   10/04/22 1100  ceFAZolin (ANCEF) IVPB 3g/100 mL premix        3 g 200 mL/hr over 30 Minutes Intravenous On call to O.R. 10/04/22 1051 10/04/22 1646        Patient was given sequential compression devices and early ambulation to prevent DVT.   Patient benefited maximally from hospital stay and there were no complications. At the  time of discharge, the patient was urinating/moving their bowels without difficulty, tolerating a regular diet, pain is controlled with oral pain medications and they have been cleared by PT/OT.   Recent vital signs: No data found.   Recent laboratory studies: No results for input(s): "WBC", "HGB", "HCT", "PLT", "NA", "K", "CL", "CO2", "BUN", "CREATININE", "GLUCOSE", "INR", "CALCIUM" in the last 72 hours.  Invalid input(s): "PT", "2"   Discharge Medications:   Allergies as of 10/05/2022       Reactions   Latex Dermatitis   Lisinopril Cough   Tramadol Nausea And Vomiting   ALL   Adhesive [tape] Rash        Medication List     STOP taking these medications    ibuprofen 800 MG tablet Commonly known as: ADVIL   isosorbide dinitrate 30 MG tablet Commonly known as: ISORDIL   meloxicam 7.5 MG tablet Commonly known as: MOBIC   traMADol 50 MG tablet Commonly known as: ULTRAM   Wegovy 1 MG/0.5ML Soaj Generic drug: Semaglutide-Weight Management       TAKE these medications    clonazePAM 1 MG tablet Commonly known as: KLONOPIN Take 1 tablet (1 mg total) by mouth 2 (two) times daily as needed for anxiety.   irbesartan-hydrochlorothiazide 150-12.5 MG tablet Commonly known as: Avalide Take 1 tablet by mouth daily.   methocarbamol 500 MG tablet Commonly known as: ROBAXIN Take 1 tablet (500 mg total) by mouth every 6 (six) hours as needed for muscle spasms.   oxyCODONE-acetaminophen 5-325 MG tablet Commonly known as: PERCOCET/ROXICET Take 1 tablet by mouth every 4 (four) hours as needed for severe pain.  Diagnostic Studies: DG Cervical Spine 2 or 3 views  Result Date: 10/04/2022 CLINICAL DATA:  Intraoperative fluoroscopy C3 through C6 ACDF. EXAM: CERVICAL SPINE - 2-3 VIEW COMPARISON:  Cervical spine radiographs 06/02/2021 FINDINGS: Images were performed intraoperatively without the presence of a radiologist. The patient is undergoing C3 through C7 anterior  plate and screw fixation and associated discectomies with placement of intervertebral disc spacers. On initial images there appears to be placement of a C3 through C6 anterior plate. On subsequent images there is a C3 through C7 anterior plate with associated fixation screws. Total fluoroscopy images: 15 Total fluoroscopy time: 43 seconds Total dose: Radiation Exposure Index (as provided by the fluoroscopic device): 17.54 mGy air Kerma Please see intraoperative findings for further detail. IMPRESSION: Intraoperative fluoroscopy for C3 through C7 ACDF. Electronically Signed   By: Neita Garnet M.D.   On: 10/04/2022 17:52   DG C-Arm 1-60 Min-No Report  Result Date: 10/04/2022 Fluoroscopy was utilized by the requesting physician.  No radiographic interpretation.   DG C-Arm 1-60 Min-No Report  Result Date: 10/04/2022 Fluoroscopy was utilized by the requesting physician.  No radiographic interpretation.   DG C-Arm 1-60 Min-No Report  Result Date: 10/04/2022 Fluoroscopy was utilized by the requesting physician.  No radiographic interpretation.   DG C-Arm 1-60 Min-No Report  Result Date: 10/04/2022 Fluoroscopy was utilized by the requesting physician.  No radiographic interpretation.    Discharge Instructions     Incentive spirometry RT   Complete by: As directed         Follow-up Information     Eldred Manges, MD Follow up in 1 week(s).   Specialty: Orthopedic Surgery Contact information: [address] [place] [address] [phone]                 Discharge Plan:  discharge to home  Disposition:     Signed: Zonia Kief  10/06/2022, 1:46 PM

## 2022-10-09 ENCOUNTER — Telehealth: Payer: Self-pay

## 2022-10-09 NOTE — Telephone Encounter (Signed)
Transition Care Management Follow-up Telephone Call Date of discharge and from where: 10/05/2022, [facility]  How have you been since you were released from the hospital? She said she is in a lot of pain.  Any questions or concerns? No  Items Reviewed: Did the pt receive and understand the discharge instructions provided? Yes  Medications obtained and verified? Yes - she said she has all of her medications and did not have any questions about the med regime  Other? No  Any new allergies since your discharge? No  Dietary orders reviewed? Yes Do you have support at home? Yes   Home Care and Equipment/Supplies: Were home health services ordered? no If so, what is the name of the agency? N/a  Has the agency set up a time to come to the patient's home? not applicable Were any new equipment or medical supplies ordered?  No What is the name of the medical supply agency? N/a Were you able to get the supplies/equipment? not applicable Do you have any questions related to the use of the equipment or supplies? No  Functional Questionnaire: (I = Independent and D = Dependent) ADLs: ambulating with RW, independent with personal care. Has raised toilet seat.    Follow up appointments reviewed:  PCP Hospital f/u appt confirmed? Yes  Scheduled to see Gwinda Passe, NP -10/18/2022  Specialist Hospital f/u appt confirmed? Yes  Scheduled to see Dr Ophelia Charter - 10/11/2022  Are transportation arrangements needed? No  If their condition worsens, is the pt aware to call PCP or go to the Emergency Dept.? Yes Was the patient provided with contact information for the PCP's office or ED? Yes Was to pt encouraged to call back with questions or concerns? Yes

## 2022-10-10 NOTE — Anesthesia Postprocedure Evaluation (Signed)
Anesthesia Post Note  Patient: Morgan Molina  Procedure(s) Performed: CERVICAL THREE-FOUR, CERVICAL FOUR-FIVE, CERVICAL FIVE-SIX, CERVICAL SIX-SEVEN, ANTERIOR CERVICAL DISCECTOMY FUSION, ALLOGRAFT, PLATE (Neck)     Patient location during evaluation: PACU Anesthesia Type: General Level of consciousness: awake and alert Pain management: pain level controlled Vital Signs Assessment: post-procedure vital signs reviewed and stable Respiratory status: spontaneous breathing, nonlabored ventilation, respiratory function stable and patient connected to nasal cannula oxygen Cardiovascular status: blood pressure returned to baseline and stable Postop Assessment: no apparent nausea or vomiting Anesthetic complications: no  No notable events documented.  Last Vitals:  Vitals:   10/05/22 0315 10/05/22 0710  BP: (!) 143/68 (!) 149/87  Pulse: 78 74  Resp: 20 18  Temp: 36.9 C 36.8 C  SpO2: 100% 100%    Last Pain:  Vitals:   10/05/22 1000  TempSrc:   PainSc: 7                  Corean Yoshimura

## 2022-10-11 ENCOUNTER — Encounter: Payer: Self-pay | Admitting: Orthopaedic Surgery

## 2022-10-11 ENCOUNTER — Ambulatory Visit (INDEPENDENT_AMBULATORY_CARE_PROVIDER_SITE_OTHER): Payer: BC Managed Care – PPO | Admitting: Orthopaedic Surgery

## 2022-10-11 ENCOUNTER — Ambulatory Visit (INDEPENDENT_AMBULATORY_CARE_PROVIDER_SITE_OTHER): Payer: BC Managed Care – PPO

## 2022-10-11 VITALS — BP 126/84 | HR 77 | Ht 64.0 in | Wt 316.0 lb

## 2022-10-11 DIAGNOSIS — Z981 Arthrodesis status: Secondary | ICD-10-CM

## 2022-10-11 MED ORDER — OXYCODONE-ACETAMINOPHEN 5-325 MG PO TABS: 1.0000 | ORAL_TABLET | ORAL | 0 refills | Status: DC | PRN

## 2022-10-11 NOTE — Progress Notes (Signed)
Post-Op Visit Note   Patient: Morgan Molina           Date of Birth: 11-12-1975           MRN: [id number] Visit Date: 10/11/2022 PCP: Grayce Sessions, NP   Assessment & Plan: Follow-up C3-C7 4 level fusion.  Mild subcutaneous swelling incision looks good.  She still having problems swallowing and is using the straw.  X-rays show what expected prevertebral swelling we discussed this will improve over a few weeks.  Pain medication renewed.  Extra collar covers given continue collar she has an extra collar for showering.  Return 5 weeks and we will obtain lateral flexion-extension C-spine x-rays on return. We discussed with multilevel fusion additionally may take a few extra weeks which she understands.  X-rays reviewed with patient and husband.  MRI scan preoperatively reviewed once again.  She has restoration of disc space height and removal of anterior spurs.  Chief Complaint  Patient presents with   Neck - Routine Post Op    10/04/2022 C3-4,C4-5, C5-6, C6-7 ACDF   Visit Diagnoses:  1. Status post cervical spinal fusion     Plan: Steri-Strips changed pain medication renewed.  Return 5 weeks for AP and lateral flexion-extension C-spine images on return.  Follow-Up Instructions: Return in about 5 weeks (around 11/15/2022).   Orders:  Orders Placed This Encounter  Procedures   XR Cervical Spine 2 or 3 views   Meds ordered this encounter  Medications   oxyCODONE-acetaminophen (PERCOCET/ROXICET) 5-325 MG tablet    Sig: Take 1 tablet by mouth every 4 (four) hours as needed for severe pain.    Dispense:  40 tablet    Refill:  0    Imaging: XR Cervical Spine 2 or 3 views  Result Date: 10/11/2022 AP lateral cervical spine images are obtained and reviewed.  This shows 4 level cervical fusion C3-4, C4-5, C5-6 and C6-7.  Allograft plate and screws are noted.  Prevertebral swelling is appreciated. Impression: Satisfactory images 4 level cervical fusion C3-C7 with allograft plate  and screws.   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   Ganglion cyst of dorsum of left wrist 10/01/2019   Decreased visual acuity 10/01/2019   PTSD (post-traumatic stress disorder) 09/28/2019   Nipple discharge 04/02/2019   Panic attacks 02/19/2019   Bilateral low back pain 02/19/2019   Seasonal allergies 02/19/2019   Anxiety and depression 01/14/2019   Syncope 01/14/2019   Breast mass, right 01/14/2019   Hypertension    Past Medical History:  Diagnosis Date   Anemia    Anxiety    Arthritis    Depression    Dyspnea    Headache    Hypertension    Muscle spasm    PTSD (post-traumatic stress disorder) 09/28/2019   Syncope     Family History  Problem Relation Age of Onset   Cancer Mother    Hypertension Mother  Heart attack Mother    Breast cancer Mother 83   CAD Father    Hypertension Father    Diabetes Father     Past Surgical History:  Procedure Laterality Date   ANTERIOR CERVICAL DECOMP/DISCECTOMY FUSION N/A 10/04/2022   Procedure: CERVICAL THREE-FOUR, CERVICAL FOUR-FIVE, CERVICAL FIVE-SIX, CERVICAL SIX-SEVEN, ANTERIOR CERVICAL DISCECTOMY FUSION, ALLOGRAFT, PLATE;  Surgeon: Eldred Manges, MD;  Location: [facility];  Service: Orthopedics;  Laterality: N/A;   BREAST EXCISIONAL BIOPSY Right    CARPAL TUNNEL RELEASE Left 02/20/2020   Procedure: LEFT CARPAL TUNNEL RELEASE;   Surgeon: Betha Loa, MD;  Location: [facility];  Service: Orthopedics;  Laterality: Left;   CARPAL TUNNEL RELEASE Right 02/28/2021   Procedure: CARPAL TUNNEL RELEASE;  Surgeon: Betha Loa, MD;  Location: [facility];  Service: Orthopedics;  Laterality: Right;   CHOLECYSTECTOMY     GANGLION CYST EXCISION Left 02/20/2020   Procedure: LEFT DORSAL GANGLION EXCISION;  Surgeon: Betha Loa, MD;  Location: [facility];  Service: Orthopedics;  Laterality: Left;   TRIGGER FINGER RELEASE Bilateral 08/29/2021   Procedure: RELEASE TRIGGER FINGER/A-1 PULLEY BILATERAL THUMBS;  Surgeon: Betha Loa, MD;  Location: [facility];  Service: Orthopedics;  Laterality: Bilateral;  30 MIN   TUBAL LIGATION     Social History   Occupational History   Occupation: Clinical biochemist  Tobacco Use   Smoking status: Former    Packs/day: 0.10    Types: Cigarettes    Quit date: 10/26/2019    Years since quitting: 2.9   Smokeless tobacco: Never  Vaping Use   Vaping Use: Never used  Substance and Sexual Activity   Alcohol use: Yes    Comment: occasional   Drug use: Yes    Types: Marijuana    Comment: Occasional marijuana use in the past   Sexual activity: Not on file

## 2022-10-16 ENCOUNTER — Telehealth: Payer: Self-pay

## 2022-10-16 NOTE — Telephone Encounter (Signed)
[facility] prior authorization approved until 05/14/2023

## 2022-10-18 ENCOUNTER — Encounter (INDEPENDENT_AMBULATORY_CARE_PROVIDER_SITE_OTHER): Payer: Self-pay | Admitting: Primary Care

## 2022-10-18 ENCOUNTER — Other Ambulatory Visit (HOSPITAL_COMMUNITY): Payer: Self-pay

## 2022-10-18 ENCOUNTER — Ambulatory Visit (INDEPENDENT_AMBULATORY_CARE_PROVIDER_SITE_OTHER): Payer: BC Managed Care – PPO | Admitting: Primary Care

## 2022-10-18 MED ORDER — SEMAGLUTIDE-WEIGHT MANAGEMENT 1 MG/0.5ML ~~LOC~~ SOAJ: 1.0000 mg | SUBCUTANEOUS | 4 refills | Status: DC

## 2022-10-18 NOTE — Progress Notes (Signed)
[facility]   Morgan Molina, is a 46 y.o. female presents for a follow up after starting on  Wegovy. Patient has been out of medication due to manufacture backorder higher doses are available will prescribe 1 mg weekly instructed to take one half weekly.  States they have better variety, increased physical activity, improved protein intake, decreased sodium intake, choose low caffeine beverages, and watch portion sizes/amount of food eaten at one time. While waiting to restart [facility] she has lost 7 pounds lbs since last visit. They deny palpitations, anxiety, trouble sleeping, elevated BP.   BP Readings from Last 3 Encounters:  10/18/22 127/84  10/11/22 126/84  10/05/22 (Abnormal) 149/87    Wt Readings from Last 3 Encounters:  10/18/22 (Abnormal) 309 lb 6.4 oz (140.3 kg)  10/11/22 (Abnormal) 316 lb (143.3 kg)  10/04/22 (Abnormal) 316 lb (143.3 kg)   Medications: Current Outpatient Medications on File Prior to Visit  Medication Sig Dispense Refill   clonazePAM (KLONOPIN) 1 MG tablet Take 1 tablet (1 mg total) by mouth 2 (two) times daily as needed for anxiety. 120 tablet 1   irbesartan-hydrochlorothiazide (AVALIDE) 150-12.5 MG tablet Take 1 tablet by mouth daily. 90 tablet 0   methocarbamol (ROBAXIN) 500 MG tablet Take 1 tablet (500 mg total) by mouth every 6 (six) hours as needed for muscle spasms. 60 tablet 0   oxyCODONE-acetaminophen (PERCOCET/ROXICET) 5-325 MG tablet Take 1 tablet by mouth every 4 (four) hours as needed for severe pain. 40 tablet 0   No current facility-administered medications on file prior to visit.    ROS:   Denies any headaches, blurred vision, fatigue, shortness of breath, chest pain, abdominal pain, abnormal vaginal discharge/itching/odor/irritation, problems with periods, bowel movements, urination, or intercourse unless otherwise stated above.  Physical exam:  Vitals:   10/18/22 1054  BP: 127/84  Pulse: 70  Resp: 16   SpO2: 98%   Physical exam: General: Vital signs reviewed.  Patient is well-developed and well-nourished, severe morbid obese female  in no acute distress and cooperative with exam. Head: Normocephalic and atraumatic. Eyes: EOMI, conjunctivae normal, no scleral icterus. Neck: Supple, trachea midline, normal ROM, no JVD, masses, thyromegaly, or carotid bruit present. Cardiovascular: RRR, S1 normal, S2 normal, no murmurs, gallops, or rubs. Pulmonary/Chest: Clear to auscultation bilaterally, no wheezes, rales, or rhonchi. Abdominal: Soft, non-tender, non-distended, BS +, no masses, organomegaly, or guarding present. Musculoskeletal: No joint deformities, erythema, or stiffness, ROM full and nontender. Extremities: No lower extremity edema bilaterally,  pulses symmetric and intact bilaterally. No cyanosis or clubbing. Neurological: A&O x3, Strength is normal Skin: Warm, dry and intact. No rashes or erythema. Psychiatric: Normal mood and affect. speech and behavior is normal. Cognition and memory are normal.    Assessment and Plan: Diagnoses and all orders for this visit:  Morbidly obese (HCC) Obesity with co morbid conditions.  Prescribed Wegovy print out prescription this will allow her to take prescription to a different pharmacy if 1 is not available   -     Semaglutide-Weight Management 1 MG/0.5ML SOAJ; Inject 1 mg into the skin once a week for 28 days.  Other orders -     Discontinue: Semaglutide-Weight Management 1 MG/0.5ML SOAJ; Inject 1 mg into the skin once a week for 28 days.     This note has been created with Education officer, environmental. Any transcriptional errors are unintentional.   Grayce Sessions, NP 10/18/2022, 11:21  AM  

## 2022-10-18 NOTE — Patient Instructions (Signed)
Semaglutide Injection (Weight Management) What is this medication? SEMAGLUTIDE (SEM a GLOO tide) promotes weight loss. It may also be used to maintain weight loss. It works by decreasing appetite. Changes to diet and exercise are often combined with this medication. This medicine may be used for other purposes; ask your health care provider or pharmacist if you have questions. COMMON BRAND NAME(S): Wegovy What should I tell my care team before I take this medication? They need to know if you have any of these conditions: Endocrine tumors (MEN 2) or if someone in your family had these tumors Eye disease, vision problems Gallbladder disease History of depression or mental health disease History of pancreatitis Kidney disease Stomach or intestine problems Suicidal thoughts, plans, or attempt; a previous suicide attempt by you or a family member Thyroid cancer or if someone in your family had thyroid cancer An unusual or allergic reaction to semaglutide, other medications, foods, dyes, or preservatives Pregnant or trying to get pregnant Breast-feeding How should I use this medication? This medication is injected under the skin. You will be taught how to prepare and give it. Take it as directed on the prescription label. It is given once every week (every 7 days). Keep taking it unless your care team tells you to stop. It is important that you put your used needles and pens in a special sharps container. Do not put them in a trash can. If you do not have a sharps container, call your pharmacist or care team to get one. A special MedGuide will be given to you by the pharmacist with each prescription and refill. Be sure to read this information carefully each time. This medication comes with INSTRUCTIONS FOR USE. Ask your pharmacist for directions on how to use this medication. Read the information carefully. Talk to your pharmacist or care team if you have questions. Talk to your care team about  the use of this medication in children. While it may be prescribed for children as young as 12 years for selected conditions, precautions do apply. Overdosage: If you think you have taken too much of this medicine contact a poison control center or emergency room at once. NOTE: This medicine is only for you. Do not share this medicine with others. What if I miss a dose? If you miss a dose and the next scheduled dose is more than 2 days away, take the missed dose as soon as possible. If you miss a dose and the next scheduled dose is less than 2 days away, do not take the missed dose. Take the next dose at your regular time. Do not take double or extra doses. If you miss your dose for 2 weeks or more, take the next dose at your regular time or call your care team to talk about how to restart this medication. What may interact with this medication? Insulin and other medications for diabetes This list may not describe all possible interactions. Give your health care provider a list of all the medicines, herbs, non-prescription drugs, or dietary supplements you use. Also tell them if you smoke, drink alcohol, or use illegal drugs. Some items may interact with your medicine. What should I watch for while using this medication? Visit your care team for regular checks on your progress. It may be some time before you see the benefit from this medication. Drink plenty of fluids while taking this medication. Check with your care team if you have severe diarrhea, nausea, and vomiting, or if you sweat a   lot. The loss of too much body fluid may make it dangerous for you to take this medication. This medication may affect blood sugar levels. Ask your care team if changes in diet or medications are needed if you have diabetes. If you or your family notice any changes in your behavior, such as new or worsening depression, thoughts of harming yourself, anxiety, other unusual or disturbing thoughts, or memory loss, call  your care team right away. Women should inform their care team if they wish to become pregnant or think they might be pregnant. Losing weight while pregnant is not advised and may cause harm to the unborn child. Talk to your care team for more information. What side effects may I notice from receiving this medication? Side effects that you should report to your care team as soon as possible: Allergic reactions--skin rash, itching, hives, swelling of the face, lips, tongue, or throat Change in vision Dehydration--increased thirst, dry mouth, feeling faint or lightheaded, headache, dark yellow or brown urine Gallbladder problems--severe stomach pain, nausea, vomiting, fever Heart palpitations--rapid, pounding, or irregular heartbeat Kidney injury--decrease in the amount of urine, swelling of the ankles, hands, or feet Pancreatitis--severe stomach pain that spreads to your back or gets worse after eating or when touched, fever, nausea, vomiting Thoughts of suicide or self-harm, worsening mood, feelings of depression Thyroid cancer--new mass or lump in the neck, pain or trouble swallowing, trouble breathing, hoarseness Side effects that usually do not require medical attention (report to your care team if they continue or are bothersome): Diarrhea Loss of appetite Nausea Stomach pain Vomiting This list may not describe all possible side effects. Call your doctor for medical advice about side effects. You may report side effects to FDA at 1-800-FDA-1088. Where should I keep my medication? Keep out of the reach of children and pets. Refrigeration (preferred): Store in the refrigerator. Do not freeze. Keep this medication in the original container until you are ready to take it. Get rid of any unused medication after the expiration date. Room temperature: If needed, prior to cap removal, the pen can be stored at room temperature for up to 28 days. Protect from light. If it is stored at room  temperature, get rid of any unused medication after 28 days or after it expires, whichever is first. It is important to get rid of the medication as soon as you no longer need it or it is expired. You can do this in two ways: Take the medication to a medication take-back program. Check with your pharmacy or law enforcement to find a location. If you cannot return the medication, follow the directions in the MedGuide. NOTE: This sheet is a summary. It may not cover all possible information. If you have questions about this medicine, talk to your doctor, pharmacist, or health care provider.  2023 Elsevier/Gold Standard (2021-01-06 00:00:00)  

## 2022-10-23 ENCOUNTER — Telehealth: Payer: Self-pay | Admitting: Orthopaedic Surgery

## 2022-10-23 NOTE — Telephone Encounter (Signed)
Pt called requesting a refill of pain medication. Pt phone number is [phone].

## 2022-10-23 NOTE — Telephone Encounter (Signed)
Please advise 

## 2022-10-24 ENCOUNTER — Other Ambulatory Visit: Payer: Self-pay | Admitting: Orthopaedic Surgery

## 2022-10-24 MED ORDER — OXYCODONE-ACETAMINOPHEN 5-325 MG PO TABS: 1.0000 | ORAL_TABLET | ORAL | 0 refills | Status: DC | PRN

## 2022-11-15 ENCOUNTER — Encounter: Payer: Self-pay | Admitting: Orthopaedic Surgery

## 2022-11-15 ENCOUNTER — Ambulatory Visit (INDEPENDENT_AMBULATORY_CARE_PROVIDER_SITE_OTHER): Payer: BC Managed Care – PPO

## 2022-11-15 ENCOUNTER — Ambulatory Visit (INDEPENDENT_AMBULATORY_CARE_PROVIDER_SITE_OTHER): Payer: BC Managed Care – PPO | Admitting: Orthopaedic Surgery

## 2022-11-15 VITALS — BP 135/78 | Ht 64.0 in | Wt 305.0 lb

## 2022-11-15 DIAGNOSIS — Z981 Arthrodesis status: Secondary | ICD-10-CM

## 2022-11-20 NOTE — Progress Notes (Signed)
Post-Op Visit Note   Patient: Morgan Molina           Date of Birth: 04-16-1976           MRN: [id number] Visit Date: 11/15/2022 PCP: Kerin Perna, NP   Assessment & Plan: Continue collar work slip given no work until 12/07/2022 and return to work 12/07/2022.  Repeat images on return visit cervical spine flexion-extension.  She is noted relief of preop symptoms.  Chief Complaint:  Chief Complaint  Patient presents with   Neck - Routine Post Op, Follow-up    10/04/2022 C3-4, C4-5, C5-6, C6-7 ACDF   Visit Diagnoses:  1. Status post cervical spinal fusion     Plan: Post four level cervical fusion.  No motion noted on flexion extension however she still has areas where the graft is not bridged.  She states her pain is improved occasionally some numbness underneath the chin.  Swelling has improved.  She has had some increased back and leg pain since 2 days for Christmas.  Work slip given no work until 12/07/2022.  New collar covers given continue cervical collar and on return we will repeat cervical spine flexion-extension images.  Follow-Up Instructions: No follow-ups on file.   Orders:  Orders Placed This Encounter  Procedures   XR Cervical Spine 2 or 3 views   No orders of the defined types were placed in this encounter.   Imaging: AP lateral cervical spine images and flexion-extension laterals obtained.   This shows 4 level cervical fusion C3-4, C4-5, C5-6, C6-7 with allograft  and plate.  More graft bridging upper than lower cervical levels.  No  motion noted.  Unchanged graft and screw position.   Impression: 4 level cervical fusion with more graft bridging upper than  lower to cervical levels as described above.   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   Ganglion cyst of dorsum of left wrist 10/01/2019   Decreased visual acuity 10/01/2019   PTSD (post-traumatic stress disorder) 09/28/2019   Nipple discharge 04/02/2019   Panic attacks 02/19/2019   Bilateral low back pain 02/19/2019   Seasonal allergies 02/19/2019   Anxiety and depression 01/14/2019   Syncope 01/14/2019   Breast mass, right 01/14/2019   Hypertension    Past Medical History:  Diagnosis Date   Anemia    Anxiety    Arthritis    Depression    Dyspnea    Headache    Hypertension    Muscle spasm    PTSD (post-traumatic stress disorder) 09/28/2019   Syncope     Family History  Problem Relation Age of Onset   Cancer Mother    Hypertension Mother    Heart attack Mother    Breast cancer Mother 1   CAD Father    Hypertension Father    Diabetes Father     Past Surgical History:  Procedure Laterality Date   ANTERIOR CERVICAL  DECOMP/DISCECTOMY FUSION N/A 10/04/2022   Procedure: CERVICAL THREE-FOUR, CERVICAL FOUR-FIVE, CERVICAL FIVE-SIX, CERVICAL SIX-SEVEN, ANTERIOR CERVICAL DISCECTOMY FUSION, ALLOGRAFT, PLATE;  Surgeon: Marybelle Killings, MD;  Location: [place];  Service: Orthopedics;  Laterality: N/A;   BREAST EXCISIONAL BIOPSY Right    CARPAL TUNNEL RELEASE Left 02/20/2020   Procedure: LEFT CARPAL TUNNEL RELEASE;  Surgeon: Leanora Cover, MD;  Location: [place];  Service: Orthopedics;  Laterality: Left;   CARPAL TUNNEL RELEASE Right 02/28/2021   Procedure: CARPAL TUNNEL RELEASE;  Surgeon: Leanora Cover, MD;  Location: [place];  Service: Orthopedics;   Laterality: Right;   CHOLECYSTECTOMY     GANGLION CYST EXCISION Left 02/20/2020   Procedure: LEFT DORSAL GANGLION EXCISION;  Surgeon: Leanora Cover, MD;  Location: [place];  Service: Orthopedics;  Laterality: Left;   TRIGGER FINGER RELEASE Bilateral 08/29/2021   Procedure: RELEASE TRIGGER FINGER/A-1 PULLEY BILATERAL THUMBS;  Surgeon: Leanora Cover, MD;  Location: [place];  Service: Orthopedics;  Laterality: Bilateral;  30 MIN   TUBAL LIGATION     Social History   Occupational History   Occupation: Therapist, art  Tobacco Use   Smoking status: Former    Packs/day: 0.10    Types: Cigarettes    Quit date: 10/26/2019    Years since quitting: 3.0   Smokeless tobacco: Never  Vaping Use   Vaping Use: Never used  Substance and Sexual Activity   Alcohol use: Yes    Comment: occasional   Drug use: Yes    Types: Marijuana    Comment: Occasional marijuana use in the past   Sexual activity: Not on file

## 2022-12-27 ENCOUNTER — Encounter: Payer: Self-pay | Admitting: Orthopaedic Surgery

## 2022-12-27 ENCOUNTER — Ambulatory Visit (INDEPENDENT_AMBULATORY_CARE_PROVIDER_SITE_OTHER): Payer: BC Managed Care – PPO

## 2022-12-27 ENCOUNTER — Ambulatory Visit (INDEPENDENT_AMBULATORY_CARE_PROVIDER_SITE_OTHER): Payer: BC Managed Care – PPO | Admitting: Orthopaedic Surgery

## 2022-12-27 VITALS — BP 121/76 | HR 96

## 2022-12-27 DIAGNOSIS — Z981 Arthrodesis status: Secondary | ICD-10-CM | POA: Diagnosis not present

## 2022-12-27 NOTE — Progress Notes (Signed)
Post-Op Visit Note   Patient: REVAE Molina           Date of Birth: 09-22-1976           MRN: [id number] Visit Date: 12/27/2022 PCP: Kerin Perna, NP   Assessment & Plan: Follow-up 4 level cervical fusion C3-C7 for cervical stenosis with early myelopathic changes and bilateral upper extremity radiculopathy.  Good relief of radicular symptoms shoulders arms and hands.  No myelopathic symptoms.  Still has some discomfort in her neck particular with extension.  Incision is well-healed.  She went back to work the first of the month.  She is not on any medication.  She still using her collar.  New collar and collar covers given.  Chief Complaint:  Chief Complaint  Patient presents with   Neck - Routine Post Op, Follow-up    10/04/2022 C3-4, C4-5, C5-6, C6-7 ACDF   Visit Diagnoses:  1. Status post cervical spinal fusion     Plan: Recheck 6 weeks.  Repeat lateral cervical flexion-extension x-ray 2 view only.  Follow-Up Instructions: No follow-ups on file.   Orders:  Orders Placed This Encounter  Procedures   XR Cervical Spine 2 or 3 views   No orders of the defined types were placed in this encounter.   Imaging: No results found.  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   Arthralgia 03/30/2021   Hyperglycemia 03/30/2021   Shortness of breath 03/30/2021   Bilateral chronic serous otitis media 08/15/2020   Eustachian tube dysfunction, bilateral 08/15/2020   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   Ganglion cyst of dorsum of left wrist 10/01/2019   Decreased visual acuity 10/01/2019   PTSD  (post-traumatic stress disorder) 09/28/2019   Nipple discharge 04/02/2019   Panic attacks 02/19/2019   Bilateral low back pain 02/19/2019   Seasonal allergies 02/19/2019   Anxiety and depression 01/14/2019   Syncope 01/14/2019   Breast mass, right 01/14/2019   Hypertension    Past Medical History:  Diagnosis Date   Anemia    Anxiety    Arthritis    Depression    Dyspnea    Headache    Hypertension    Muscle spasm    PTSD (post-traumatic stress disorder) 09/28/2019   Syncope     Family History  Problem Relation Age of Onset   Cancer Mother    Hypertension Mother    Heart attack Mother    Breast cancer Mother 7   CAD Father    Hypertension Father    Diabetes Father     Past Surgical History:  Procedure Laterality Date   ANTERIOR CERVICAL DECOMP/DISCECTOMY FUSION N/A 10/04/2022   Procedure: CERVICAL THREE-FOUR, CERVICAL FOUR-FIVE, CERVICAL FIVE-SIX, CERVICAL SIX-SEVEN, ANTERIOR CERVICAL DISCECTOMY FUSION, ALLOGRAFT, PLATE;  Surgeon: Marybelle Killings, MD;  Location: [facility];  Service: Orthopedics;  Laterality: N/A;   BREAST EXCISIONAL BIOPSY Right    CARPAL TUNNEL RELEASE Left 02/20/2020   Procedure: LEFT CARPAL TUNNEL RELEASE;  Surgeon: Leanora Cover, MD;  Location: [facility];  Service: Orthopedics;  Laterality: Left;   CARPAL TUNNEL RELEASE Right 02/28/2021   Procedure: CARPAL TUNNEL  RELEASE;  Surgeon: Leanora Cover, MD;  Location: [facility];  Service: Orthopedics;  Laterality: Right;   CHOLECYSTECTOMY     GANGLION CYST EXCISION Left 02/20/2020   Procedure: LEFT DORSAL GANGLION EXCISION;  Surgeon: Leanora Cover, MD;  Location: [facility];  Service: Orthopedics;  Laterality: Left;   TRIGGER FINGER RELEASE Bilateral 08/29/2021   Procedure: RELEASE TRIGGER FINGER/A-1 PULLEY BILATERAL THUMBS;  Surgeon: Leanora Cover, MD;  Location: [facility];  Service: Orthopedics;  Laterality: Bilateral;  30 MIN   TUBAL LIGATION      Social History   Occupational History   Occupation: Therapist, art  Tobacco Use   Smoking status: Former    Packs/day: 0.10    Types: Cigarettes    Quit date: 10/26/2019    Years since quitting: 3.1   Smokeless tobacco: Never  Vaping Use   Vaping Use: Never used  Substance and Sexual Activity   Alcohol use: Yes    Comment: occasional   Drug use: Yes    Types: Marijuana    Comment: Occasional marijuana use in the past   Sexual activity: Not on file

## 2023-01-02 ENCOUNTER — Telehealth: Payer: Self-pay | Admitting: Emergency Medicine

## 2023-01-02 NOTE — Telephone Encounter (Signed)
Copied from [place] [id number]. Topic: General - Inquiry >> Jan 02, 2023  8:45 AM Penni Bombard wrote: Reason for CRM: pt called wanting to speak to someone at the office.  She would not say why.  She said Sharyn Lull told her to call and ask for someone in the office if she needed help.     [phone]

## 2023-01-03 ENCOUNTER — Ambulatory Visit (HOSPITAL_COMMUNITY)
Admission: EM | Admit: 2023-01-03 | Discharge: 2023-01-03 | Disposition: A | Discharge status: Home / Self Care | Payer: BC Managed Care – PPO

## 2023-01-03 ENCOUNTER — Encounter (HOSPITAL_COMMUNITY): Payer: Self-pay | Admitting: *Deleted

## 2023-01-03 ENCOUNTER — Other Ambulatory Visit: Payer: Self-pay

## 2023-01-03 DIAGNOSIS — A084 Viral intestinal infection, unspecified: Secondary | ICD-10-CM | POA: Diagnosis not present

## 2023-01-03 LAB — POC URINE PREG, ED: Preg Test, Ur: NEGATIVE

## 2023-01-03 LAB — POCT URINALYSIS DIPSTICK, ED / UC
Bilirubin Urine: NEGATIVE
Glucose, UA: 250 mg/dL — AB
Hgb urine dipstick: NEGATIVE
Ketones, ur: NEGATIVE mg/dL
Leukocytes,Ua: NEGATIVE
Nitrite: NEGATIVE
Protein, ur: 30 mg/dL — AB
Specific Gravity, Urine: 1.02 (ref 1.005–1.030)
Urobilinogen, UA: 0.2 mg/dL (ref 0.0–1.0)
pH: 6 (ref 5.0–8.0)

## 2023-01-03 MED ORDER — PHENYLEPHRINE IN HARD FAT 0.25 % RE SUPP: 1.0000 | Freq: Two times a day (BID) | RECTAL | 0 refills | Status: DC

## 2023-01-03 MED ORDER — ONDANSETRON HCL 4 MG PO TABS: 4.0000 mg | ORAL_TABLET | Freq: Three times a day (TID) | ORAL | 0 refills | Status: DC | PRN

## 2023-01-03 NOTE — Discharge Instructions (Signed)
Try the zofran for nausea Try the preparation H for potential hemorrhoid Hope you feel better!

## 2023-01-03 NOTE — ED Triage Notes (Signed)
C/O starting with rectal pressure and pain approx 2 wks ago, which resolved, then started with constant generalized abd pain onset 8-9 days ago; 6 days ago started with rectal bleeding - describes as bright red blood in toilet with every BM - approx 1-2 times per day. States stool is loose. Now c/o nausea, and started vomiting this AM.

## 2023-01-03 NOTE — ED Provider Notes (Signed)
[facility]   [id number] 01/03/23 Arrival Time: [id number]  ASSESSMENT & PLAN:  1. Viral gastroenteritis    -History and exam is consistent with a viral gastroenteritis.  The small amount of bright red blood is likely due to irritation of the distal portion of the inguinal canal.  Hemorrhoids versus fissure.  I recommended that she takes Zofran as needed for nausea and Preparation H as needed for any potential hemorrhoids or fissures.  I recommend she try Imodium for diarrhea.  Recommended to stay well-hydrated with clear liquids and advance diet as tolerated.  ER precautions given for worsening blood per rectum, dark red blood per rectum, or worsening abdominal pain.  All questions answered she agrees to plan.  Meds ordered this encounter  Medications   ondansetron (ZOFRAN) 4 MG tablet    Sig: Take 1 tablet (4 mg total) by mouth every 8 (eight) hours as needed for nausea or vomiting.    Dispense:  20 tablet    Refill:  0   phenylephrine (,USE FOR PREPARATION-H,) 0.25 % suppository    Sig: Place 1 suppository rectally 2 (two) times daily.    Dispense:  12 suppository    Refill:  0     Discharge Instructions      Try the zofran for nausea Try the preparation H for potential hemorrhoid Hope you feel better!        Reviewed expectations re: course of current medical issues. Questions answered. Outlined signs and symptoms indicating need for more acute intervention. Patient verbalized understanding. After Visit Summary given.   SUBJECTIVE: Pleasant 47 year old female comes urgent care to be evaluated for 1 week of abdominal pain and rectal bleeding with now 1 day of nausea vomiting.  Her symptoms started about a week ago out of the blue.  No recent travel no new diets or undercooked food.  She has had crampy abdominal pain that is worse in the [place] umbilical region.  She has been having 3-4 bowel movements a day that are watery and poorly formed.  She has been noticing  small amounts of bright red blood in the toilet and on her tissue paper.  She denies any dark red blood in the toilet bowl.  She denies having to push very hard to strain.  She says she does not feel any external  hemorrhoids.  This morning she woke up and had some associated nausea and vomiting.  She had a few episodes of nonbloody vomiting this morning.  She has not tried taking any medications.  She is unsure of any sick contacts.  She denies any fevers.  No chest pain or shortness of breath.  Patient's last menstrual period was 12/19/2022 (approximate). Past Surgical History:  Procedure Laterality Date   ANTERIOR CERVICAL DECOMP/DISCECTOMY FUSION N/A 10/04/2022   Procedure: CERVICAL THREE-FOUR, CERVICAL FOUR-FIVE, CERVICAL FIVE-SIX, CERVICAL SIX-SEVEN, ANTERIOR CERVICAL DISCECTOMY FUSION, ALLOGRAFT, PLATE;  Surgeon: Marybelle Killings, MD;  Location: [facility];  Service: Orthopedics;  Laterality: N/A;   BREAST EXCISIONAL BIOPSY Right    CARPAL TUNNEL RELEASE Left 02/20/2020   Procedure: LEFT CARPAL TUNNEL RELEASE;  Surgeon: Leanora Cover, MD;  Location: [facility];  Service: Orthopedics;  Laterality: Left;   CARPAL TUNNEL RELEASE Right 02/28/2021   Procedure: CARPAL TUNNEL RELEASE;  Surgeon: Leanora Cover, MD;  Location: [facility];  Service: Orthopedics;  Laterality: Right;   CHOLECYSTECTOMY     GANGLION CYST EXCISION Left 02/20/2020   Procedure: LEFT DORSAL GANGLION EXCISION;  Surgeon: Fredna Dow,  Lennette Bihari, MD;  Location: [facility];  Service: Orthopedics;  Laterality: Left;   TRIGGER FINGER RELEASE Bilateral 08/29/2021   Procedure: RELEASE TRIGGER FINGER/A-1 PULLEY BILATERAL THUMBS;  Surgeon: Leanora Cover, MD;  Location: [facility];  Service: Orthopedics;  Laterality: Bilateral;  30 MIN   TUBAL LIGATION       OBJECTIVE:  Vitals:   01/03/23 0949  BP: 122/74  Pulse: 100  Resp: 20  Temp: 98.3 F (36.8 C)  TempSrc: Oral  SpO2: 96%      Physical Exam Vitals reviewed.  Constitutional:      Appearance: She is obese.  HENT:     Head: Normocephalic.  Cardiovascular:     Rate and Rhythm: Normal rate and regular rhythm.     Heart sounds: Normal heart sounds.  Pulmonary:     Effort: Pulmonary effort is normal. No respiratory distress.  Abdominal:     General: Abdomen is flat. Bowel sounds are normal.     Palpations: Abdomen is soft.     Tenderness: There is abdominal tenderness in the epigastric area and periumbilical area. There is no guarding or rebound. Negative signs include McBurney's sign.  Genitourinary:    Comments: Offered but deferred by patient Neurological:     Mental Status: She is alert.      Labs: Results for orders placed or performed during the hospital encounter of 01/03/23  POCT Urinalysis Dipstick (ED/UC)  Result Value Ref Range   Glucose, UA 250 (A) NEGATIVE mg/dL   Bilirubin Urine NEGATIVE NEGATIVE   Ketones, ur NEGATIVE NEGATIVE mg/dL   Specific Gravity, Urine 1.020 1.005 - 1.030   Hgb urine dipstick NEGATIVE NEGATIVE   pH 6.0 5.0 - 8.0   Protein, ur 30 (A) NEGATIVE mg/dL   Urobilinogen, UA 0.2 0.0 - 1.0 mg/dL   Nitrite NEGATIVE NEGATIVE   Leukocytes,Ua NEGATIVE NEGATIVE  POC urine preg, ED (not at [facility])  Result Value Ref Range   Preg Test, Ur NEGATIVE NEGATIVE   Labs Reviewed  POCT URINALYSIS DIPSTICK, ED / UC - Abnormal; Notable for the following components:      Result Value   Glucose, UA 250 (*)    Protein, ur 30 (*)    All other components within normal limits  POC URINE PREG, ED    Imaging: No results found.   Allergies  Allergen Reactions   Latex Dermatitis   Lisinopril Cough   Tramadol Nausea And Vomiting    ALL   Adhesive [Tape] Rash                                               Past Medical History:  Diagnosis Date   Anemia    Anxiety    Arthritis    Depression    Dyspnea    Headache    Hypertension    Muscle spasm    PTSD (post-traumatic stress  disorder) 09/28/2019   Syncope     Social History   Socioeconomic History   Marital status: Married    Spouse name: Not on file   Number of children: 5   Years of education: some college   Highest education level: Not on file  Occupational History   Occupation: Customer Service  Tobacco Use   Smoking status: Former    Packs/day: 0.10    Types: Cigarettes    Quit  date: 10/26/2019    Years since quitting: 3.1   Smokeless tobacco: Never  Vaping Use   Vaping Use: Never used  Substance and Sexual Activity   Alcohol use: Yes    Comment: occasionally   Drug use: Yes    Types: Marijuana    Comment: occasionally   Sexual activity: Yes    Birth control/protection: Surgical  Other Topics Concern   Not on file  Social History Narrative   Lives at home with family.   Right-handed.   No caffeine.   Social Determinants of Health   Financial Resource Strain: Not on file  Food Insecurity: Not on file  Transportation Needs: Not on file  Physical Activity: Not on file  Stress: Not on file  Social Connections: Not on file  Intimate Partner Violence: Not on file    Family History  Problem Relation Age of Onset   Cancer Mother    Hypertension Mother    Heart attack Mother    Breast cancer Mother 35   CAD Father    Hypertension Father    Diabetes Father       [place], Dorian Pod, MD 01/03/23 1057

## 2023-01-04 ENCOUNTER — Telehealth: Payer: Self-pay | Admitting: Orthopaedic Surgery

## 2023-01-04 NOTE — Telephone Encounter (Signed)
Returned pt call. Pt states she went to urgent care yesterday she has been having rectal bleeding and abdominal pain. Pt states the urgent care visit was a waste of her time. Pt was unable to take the appt for 01/05/23 at 1010 so pt will be coming in 01/11/23 at 1010am to see Faith Rogue

## 2023-01-04 NOTE — Telephone Encounter (Signed)
Patient offer earlier appt decline

## 2023-01-04 NOTE — Telephone Encounter (Signed)
Called pt and left vm for pt to call and reschedule per Dr Lorin Mercy

## 2023-01-11 ENCOUNTER — Other Ambulatory Visit (INDEPENDENT_AMBULATORY_CARE_PROVIDER_SITE_OTHER): Payer: Self-pay | Admitting: Primary Care

## 2023-01-11 ENCOUNTER — Ambulatory Visit (INDEPENDENT_AMBULATORY_CARE_PROVIDER_SITE_OTHER): Payer: BC Managed Care – PPO | Admitting: Primary Care

## 2023-01-11 ENCOUNTER — Encounter (INDEPENDENT_AMBULATORY_CARE_PROVIDER_SITE_OTHER): Payer: Self-pay | Admitting: Primary Care

## 2023-01-11 VITALS — BP 112/77 | HR 96 | Resp 16 | Wt 294.2 lb

## 2023-01-11 DIAGNOSIS — Z1159 Encounter for screening for other viral diseases: Secondary | ICD-10-CM | POA: Diagnosis not present

## 2023-01-11 DIAGNOSIS — K625 Hemorrhage of anus and rectum: Secondary | ICD-10-CM

## 2023-01-11 DIAGNOSIS — Z760 Encounter for issue of repeat prescription: Secondary | ICD-10-CM

## 2023-01-11 DIAGNOSIS — Z9189 Other specified personal risk factors, not elsewhere classified: Secondary | ICD-10-CM

## 2023-01-11 MED ORDER — IRBESARTAN-HYDROCHLOROTHIAZIDE 150-12.5 MG PO TABS: 1.0000 | ORAL_TABLET | Freq: Every day | ORAL | 1 refills | Status: DC

## 2023-01-11 MED ORDER — SEMAGLUTIDE-WEIGHT MANAGEMENT 1 MG/0.5ML ~~LOC~~ SOAJ: 1.0000 mg | SUBCUTANEOUS | 4 refills | Status: DC

## 2023-01-11 NOTE — Addendum Note (Signed)
Addended by: Juluis Mire on: 01/11/2023 03:37 PM   Modules accepted: Orders

## 2023-01-11 NOTE — Telephone Encounter (Signed)
Will forward to provider  

## 2023-01-11 NOTE — Progress Notes (Signed)
[facility]   Subjective:   [name] is a 47 y.o. morbid obesity female presents for Urgent care on 01/03/23, for rectal bleeding and abdominal pain. She was unable to keep liquids or solid down when she had emesis it was yellow and green also dry heaves . Patient was discharged with dx of Viral gastroenteritis and tx with phenylephrine (,USE FOR PREPARATION-H,) 0.25 % suppository and Zofran. Patient has No headache, No chest pain, No abdominal pain - No Nausea, No new weakness tingling or numbness, No Cough - shortness of breath.   Past Medical History:  Diagnosis Date   Anemia    Anxiety    Arthritis    Depression    Dyspnea    Headache    Hypertension    Muscle spasm    PTSD (post-traumatic stress disorder) 09/28/2019   Syncope      Allergies  Allergen Reactions   Latex Dermatitis   Lisinopril Cough   Tramadol Nausea And Vomiting    ALL   Adhesive [Tape] Rash      Current Outpatient Medications on File Prior to Visit  Medication Sig Dispense Refill   clonazePAM (KLONOPIN) 1 MG tablet Take 1 tablet (1 mg total) by mouth 2 (two) times daily as needed for anxiety. 120 tablet 1   IBUPROFEN PO Take by mouth.     irbesartan-hydrochlorothiazide (AVALIDE) 150-12.5 MG tablet Take 1 tablet by mouth daily. 90 tablet 0   methocarbamol (ROBAXIN) 500 MG tablet Take 1 tablet (500 mg total) by mouth every 6 (six) hours as needed for muscle spasms. (Patient not taking: Reported on 12/27/2022) 60 tablet 0   ondansetron (ZOFRAN) 4 MG tablet Take 1 tablet (4 mg total) by mouth every 8 (eight) hours as needed for nausea or vomiting. 20 tablet 0   oxyCODONE-acetaminophen (PERCOCET/ROXICET) 5-325 MG tablet Take 1 tablet by mouth every 4 (four) hours as needed for severe pain. (Patient not taking: Reported on 12/27/2022) 40 tablet 0   phenylephrine (,USE FOR PREPARATION-H,) 0.25 % suppository Place 1 suppository rectally 2 (two) times daily. 12 suppository 0    Semaglutide-Weight Management 1 MG/0.5ML SOAJ Inject 1 mg into the skin once a week for 28 days. 0.5 mL 4   No current facility-administered medications on file prior to visit.     Review of System: Comprehensive ROS Pertinent positive and negative noted in HPI    Objective:  Blood Pressure 112/77   Pulse 96   Respiration 16   Weight 294 lb 3.2 oz (133.4 kg)   Last Menstrual Period 12/19/2022 (Approximate)   Oxygen Saturation 98%   Body Mass Index 50.50 kg/m   Filed Weights   01/11/23 1014  Weight: 294 lb 3.2 oz (133.4 kg)   Physical Exam: General Appearance: Well nourished, morbid obese in no apparent distress. Eyes: PERRLA, EOMs, conjunctiva no swelling or erythema Sinuses: No Frontal/maxillary tenderness ENT/Mouth: Ext aud canals clear, TMs without erythema, bulging. Hearing normal.  Neck: Supple, thyroid normal.  Respiratory: Respiratory effort normal, BS equal bilaterally without rales, rhonchi, wheezing or stridor.  Cardio: RRR with no MRGs. Brisk peripheral pulses without edema.  Abdomen: Soft, + BS.  Non tender, no guarding, rebound, hernias, masses. Lymphatics: Non tender without lymphadenopathy.  Skin: Warm, dry without rashes, lesions, ecchymosis.  Neuro: Cranial nerves intact. Normal muscle tone, no cerebellar symptoms. Sensation intact.  Psych: Awake and oriented X 3, normal affect, Insight and Judgment appropriate.    Assessment:  Morgan Molina was seen today for  hospitalization follow-up.  Diagnoses and all orders for this visit:  Rectal bleeding -     CBC with Differential -     Ambulatory referral to Gastroenterology  Encounter for hepatitis C virus screening test for high risk patient -     Hepatitis C Antibody  Morbidly obese ([place]) -     Semaglutide-Weight Management 1 MG/0.5ML SOAJ; Inject 1 mg into the skin once a week for 28 days.  Other orders/Medication refill -     irbesartan-hydrochlorothiazide (AVALIDE) 150-12.5 MG tablet; Take 1 tablet by  mouth daily.     This note has been created with Surveyor, quantity. Any transcriptional errors are unintentional.   Kerin Perna, NP 01/11/2023, 10:58 AM

## 2023-01-12 LAB — CBC WITH DIFFERENTIAL/PLATELET
Basophils Absolute: 0.1 10*3/uL (ref 0.0–0.2)
Basos: 1 %
EOS (ABSOLUTE): 0.1 10*3/uL (ref 0.0–0.4)
Eos: 2 %
Hematocrit: 33.6 % — ABNORMAL LOW (ref 34.0–46.6)
Hemoglobin: 10.4 g/dL — ABNORMAL LOW (ref 11.1–15.9)
Immature Grans (Abs): 0 10*3/uL (ref 0.0–0.1)
Immature Granulocytes: 0 %
Lymphocytes Absolute: 2.9 10*3/uL (ref 0.7–3.1)
Lymphs: 46 %
MCH: 24.9 pg — ABNORMAL LOW (ref 26.6–33.0)
MCHC: 31 g/dL — ABNORMAL LOW (ref 31.5–35.7)
MCV: 81 fL (ref 79–97)
Monocytes Absolute: 0.5 10*3/uL (ref 0.1–0.9)
Monocytes: 8 %
Neutrophils Absolute: 2.7 10*3/uL (ref 1.4–7.0)
Neutrophils: 43 %
Platelets: 387 10*3/uL (ref 150–450)
RBC: 4.17 x10E6/uL (ref 3.77–5.28)
RDW: 17.2 % — ABNORMAL HIGH (ref 11.7–15.4)
WBC: 6.3 10*3/uL (ref 3.4–10.8)

## 2023-01-12 LAB — HEPATITIS C ANTIBODY: Hep C Virus Ab: NONREACTIVE

## 2023-01-17 ENCOUNTER — Encounter (INDEPENDENT_AMBULATORY_CARE_PROVIDER_SITE_OTHER): Payer: Self-pay

## 2023-02-05 ENCOUNTER — Ambulatory Visit (INDEPENDENT_AMBULATORY_CARE_PROVIDER_SITE_OTHER): Payer: BC Managed Care – PPO | Admitting: Primary Care

## 2023-02-05 ENCOUNTER — Encounter (INDEPENDENT_AMBULATORY_CARE_PROVIDER_SITE_OTHER): Payer: Self-pay | Admitting: Primary Care

## 2023-02-05 VITALS — BP 135/89 | HR 88 | Resp 16 | Wt 295.2 lb

## 2023-02-05 DIAGNOSIS — K625 Hemorrhage of anus and rectum: Secondary | ICD-10-CM | POA: Diagnosis not present

## 2023-02-05 DIAGNOSIS — Z0289 Encounter for other administrative examinations: Secondary | ICD-10-CM

## 2023-02-05 NOTE — Progress Notes (Signed)
[place], is a 47 y.o. female  [id number]  [id number]  DOB - 27-Jan-1976   Subjective:   [name] is a 47 y.o. female here today for a follow up for paper work FMLA for here job for doctors appt. She states has not been scheduled for her GI appt and continues to have rectal bleeding called GI and she will receive a call in the next day or two.  Patient has No headache, No chest pain, No abdominal pain - No Nausea, No new weakness tingling or numbness, No Cough - shortness of breath.  No problems updated. Allergies  Allergen Reactions   Latex Dermatitis   Lisinopril Cough   Tramadol Nausea And Vomiting    ALL   Adhesive [Tape] Rash    Past Medical History:  Diagnosis Date   Anemia    Anxiety    Arthritis    Depression    Dyspnea    Headache    Hypertension    Muscle spasm    PTSD (post-traumatic stress disorder) 09/28/2019   Syncope     Current Outpatient Medications on File Prior to Visit  Medication Sig Dispense Refill   clonazePAM (KLONOPIN) 1 MG tablet Take 1 tablet (1 mg total) by mouth 2 (two) times daily as needed for anxiety. 120 tablet 1   IBUPROFEN PO Take by mouth.     irbesartan-hydrochlorothiazide (AVALIDE) 150-12.5 MG tablet Take 1 tablet by mouth daily. 90 tablet 1   methocarbamol (ROBAXIN) 500 MG tablet Take 1 tablet (500 mg total) by mouth every 6 (six) hours as needed for muscle spasms. (Patient not taking: Reported on 12/27/2022) 60 tablet 0   ondansetron (ZOFRAN) 4 MG tablet Take 1 tablet (4 mg total) by mouth every 8 (eight) hours as needed for nausea or vomiting. 20 tablet 0   oxyCODONE-acetaminophen (PERCOCET/ROXICET) 5-325 MG tablet Take 1 tablet by mouth every 4 (four) hours as needed for severe pain. (Patient not taking: Reported on 12/27/2022) 40 tablet 0   phenylephrine (,USE FOR PREPARATION-H,) 0.25 % suppository Place 1 suppository rectally 2 (two) times daily. 12 suppository 0   No current  facility-administered medications on file prior to visit.    Objective:  Blood Pressure 135/89   Pulse 88   Respiration 16   Weight 295 lb 3.2 oz (133.9 kg)   Oxygen Saturation 98%   Body Mass Index 50.67 kg/m    Comprehensive ROS Pertinent positive and negative noted in HPI   Exam General appearance : Awake, alert, not in any distress. Speech Clear. Not toxic looking HEENT: Atraumatic and Normocephalic, pupils equally reactive to light and accomodation Neck: deferred neck brace.  Chest: Good air entry bilaterally, no added sounds  CVS: S1 S2 regular, no murmurs.  Abdomen: Bowel sounds present, Non tender and not distended with no gaurding, rigidity or rebound. Extremities: B/L Lower Ext shows no edema, both legs are warm to touch Neurology: Awake alert, and oriented X 3, CN II-XII intact, Non focal Skin: No Rash  Data Review Lab Results  Component Value Date   HGBA1C 5.9 07/12/2022   HGBA1C 5.8 (A) 01/25/2022   HGBA1C 5.9 (H) 06/16/2021    Assessment & Plan  [name] was seen today for form completion.  Diagnoses and all orders for this visit:  Rectal bleeding See HPI  Encounter for completion of form with patient Completed      Patient have been counseled extensively about nutrition and exercise. Other issues discussed during this  visit include: low cholesterol diet, weight control and daily exercise, foot care, annual eye examinations at Ophthalmology, importance of adherence with medications and regular follow-up. We also discussed long term complications of uncontrolled diabetes and hypertension.   Return in about 3 months (around 05/07/2023).  The patient was given clear instructions to go to ER or return to medical center if symptoms don't improve, worsen or new problems develop. The patient verbalized understanding. The patient was told to call to get lab results if they haven't heard anything in the next week.   This note has been created with Biomedical engineer. Any transcriptional errors are unintentional.   Kerin Perna, NP 02/05/2023, 1:41 PM

## 2023-02-07 ENCOUNTER — Ambulatory Visit: Payer: BC Managed Care – PPO | Admitting: Orthopaedic Surgery

## 2023-02-09 ENCOUNTER — Other Ambulatory Visit (INDEPENDENT_AMBULATORY_CARE_PROVIDER_SITE_OTHER): Payer: BC Managed Care – PPO

## 2023-02-09 ENCOUNTER — Ambulatory Visit (INDEPENDENT_AMBULATORY_CARE_PROVIDER_SITE_OTHER): Payer: BC Managed Care – PPO | Admitting: Orthopaedic Surgery

## 2023-02-09 ENCOUNTER — Encounter: Payer: Self-pay | Admitting: Orthopaedic Surgery

## 2023-02-09 VITALS — BP 132/73 | HR 98 | Ht 64.0 in | Wt 295.0 lb

## 2023-02-09 DIAGNOSIS — Z981 Arthrodesis status: Secondary | ICD-10-CM

## 2023-02-09 NOTE — Progress Notes (Signed)
Office Visit Note   Patient: Morgan Molina           Date of Birth: 1976/09/08           MRN: [id number] Visit Date: 02/09/2023              Requested by: Grayce Sessions, NP [address] [place],  [place] [address] PCP: Grayce Sessions, NP   Assessment & Plan: Visit Diagnoses:  1. Status post cervical spinal fusion     Plan: Patient can discontinue collar.  Return 2 months repeat 2 view x-ray C-spine.  She is very happy with the surgical result and happy to be out of her collar.    Follow-Up Instructions: Return in about 2 months (around 04/11/2023).   Orders:  Orders Placed This Encounter  Procedures   XR Cervical Spine 2 or 3 views   No orders of the defined types were placed in this encounter.     Procedures: No procedures performed   Clinical Data: No additional findings.   Subjective: Chief Complaint  Patient presents with   Neck - Follow-up    10/04/2022 C3-4, C4-5, C5-6, C6-7 ACDF    HPI follow-up 4 level cervical fusion with severe stenosis.  Patient notes her neck feels little stiff preop pain is gone no walking problems no myelopathic problems.  Patient is already back at work.  Review of Systems updated unchanged   Objective: Vital Signs: BP 132/73   Pulse 98   Ht 5\' 4"  (1.626 m)   Wt 295 lb (133.8 kg)   BMI 50.64 kg/m   Physical Exam Constitutional:      Appearance: She is well-developed.  HENT:     Head: Normocephalic.     Right Ear: External ear normal.     Left Ear: External ear normal. There is no impacted cerumen.  Eyes:     Pupils: Pupils are equal, round, and reactive to light.  Neck:     Thyroid: No thyromegaly.     Trachea: No tracheal deviation.  Cardiovascular:     Rate and Rhythm: Normal rate.  Pulmonary:     Effort: Pulmonary effort is normal.  Abdominal:     Palpations: Abdomen is soft.  Musculoskeletal:     Cervical back: No rigidity.  Skin:    General: Skin is warm and dry.  Neurological:      Mental Status: She is alert and oriented to person, place, and time.  Psychiatric:        Behavior: Behavior normal.     Ortho Exam cervical incision looks good.  No brachial plexus tenderness..  For percent cervical rotation.  Specialty Comments:  MRI LUMBAR SPINE WITHOUT CONTRAST   TECHNIQUE: Multiplanar, multisequence MR imaging of the lumbar spine was performed. No intravenous contrast was administered.   COMPARISON:  Prior radiograph from 08/04/2021   FINDINGS: Segmentation: Standard. Lowest well-formed disc space labeled the L5-S1 level.   Alignment: Trace degenerative anterolisthesis of L3 on L4 and L4 on L5. Alignment otherwise normal preservation of the normal lumbar lordosis.   Vertebrae: Vertebral body height maintained without acute or chronic fracture. Bone marrow signal intensity diffusely decreased on T1 weighted imaging, nonspecific, but most commonly related to anemia, smoking, or obesity. Mild reactive endplate change present about the L2-3 interspace. Reactive marrow edema present about the right greater than left L4-5 facets due to facet arthritis. No other abnormal marrow edema.   Conus medullaris and cauda equina: Conus extends to the L1-2  level. Conus and cauda equina appear normal.   Paraspinal and other soft tissues: Paraspinous soft tissues demonstrate no acute finding. Visualized visceral structures unremarkable.   Disc levels:   T11-12: Seen only on sagittal projection. Mild disc bulge with disc desiccation. Bilateral facet hypertrophy. No significant spinal stenosis. Moderate bilateral foraminal narrowing.   T12-L1: Normal interspace. Mild bilateral facet hypertrophy. No stenosis.   L1-2: Negative interspace. Mild bilateral facet hypertrophy. No stenosis.   L2-3: Degenerative intervertebral disc space narrowing with diffuse disc bulge and disc desiccation. Superimposed biforaminal disc protrusions, right slightly larger than left  (series 6, image 15). Superimposed small left subarticular component noted on the left (series 6, image 16). Moderate bilateral facet hypertrophy. Mild prominence of the dorsal epidural fat. Resultant moderate spinal stenosis. Moderate bilateral L2 foraminal narrowing.   L3-4: Trace listhesis. Diffuse disc bulge with disc desiccation. Superimposed right foraminal disc protrusion contacts the exiting right L3 nerve root (series 6, image 19). Moderate bilateral facet hypertrophy. Resultant mild spinal stenosis. Mild left with moderate right L3 foraminal narrowing.   L4-5: Right foraminal to extraforaminal disc protrusion contacts the exiting right L4 nerve root as it courses of the right neural foramen (series 6, image 24). Severe right worse than left facet arthrosis. No significant spinal stenosis. Mild left with moderate right L4 foraminal narrowing.   L5-S1: Negative interspace. Severe left with moderate right facet arthrosis. No significant spinal stenosis. Foramina remain patent.   IMPRESSION: 1. Multifactorial degenerative changes at L2-3 with resultant moderate canal and bilateral L2 foraminal stenosis. 2. Right foraminal to extraforaminal disc protrusions at L3-4 and L4-5, contacting and potentially affecting the exiting right L3 and L4 nerve roots respectively. 3. Moderate to severe lower lumbar facet arthrosis, most pronounced at L4-5 on the right and L5-S1 on the left. Findings could contribute to lower back pain.     Electronically Signed   By: Rise MuBenjamin  McClintock M.D.   On: 10/03/2021 01:47  Imaging: No results found.   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   Arthralgia 03/30/2021   Hyperglycemia 03/30/2021   Shortness of breath 03/30/2021   Bilateral chronic serous otitis media 08/15/2020   Eustachian tube dysfunction, bilateral 08/15/2020   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   Ganglion cyst of dorsum of left wrist 10/01/2019   Decreased visual acuity 10/01/2019   PTSD (post-traumatic stress disorder) 09/28/2019   Nipple discharge 04/02/2019   Panic attacks 02/19/2019   Bilateral low back pain 02/19/2019   Seasonal allergies 02/19/2019   Anxiety and depression 01/14/2019   Syncope 01/14/2019   Breast mass, right 01/14/2019   Hypertension    Past Medical History:  Diagnosis Date   Anemia    Anxiety    Arthritis    Depression    Dyspnea    Headache    Hypertension    Muscle spasm    PTSD (post-traumatic stress disorder) 09/28/2019   Syncope     Family History  Problem Relation Age of Onset   Cancer Mother    Hypertension Mother    Heart attack Mother    Breast cancer Mother [id number]   CAD Father  Hypertension Father    Diabetes Father     Past Surgical History:  Procedure Laterality Date   ANTERIOR CERVICAL DECOMP/DISCECTOMY FUSION N/A 10/04/2022   Procedure: CERVICAL THREE-FOUR, CERVICAL FOUR-FIVE, CERVICAL FIVE-SIX, CERVICAL SIX-SEVEN, ANTERIOR CERVICAL DISCECTOMY FUSION, ALLOGRAFT, PLATE;  Surgeon: Eldred MangesYates, Leopold Smyers C, MD;  Location: [facility];  Service: Orthopedics;  Laterality: N/A;   BREAST EXCISIONAL BIOPSY Right    CARPAL TUNNEL RELEASE Left 02/20/2020   Procedure: LEFT CARPAL TUNNEL RELEASE;  Surgeon: Betha LoaKuzma, Kevin, MD;  Location: [facility];  Service: Orthopedics;  Laterality: Left;   CARPAL TUNNEL RELEASE Right 02/28/2021   Procedure: CARPAL TUNNEL RELEASE;  Surgeon: Betha LoaKuzma, Kevin, MD;  Location: [facility];  Service: Orthopedics;  Laterality: Right;   CHOLECYSTECTOMY     GANGLION CYST EXCISION Left 02/20/2020    Procedure: LEFT DORSAL GANGLION EXCISION;  Surgeon: Betha LoaKuzma, Kevin, MD;  Location: [facility];  Service: Orthopedics;  Laterality: Left;   TRIGGER FINGER RELEASE Bilateral 08/29/2021   Procedure: RELEASE TRIGGER FINGER/A-1 PULLEY BILATERAL THUMBS;  Surgeon: Betha LoaKuzma, Kevin, MD;  Location: [facility];  Service: Orthopedics;  Laterality: Bilateral;  30 MIN   TUBAL LIGATION     Social History   Occupational History   Occupation: Clinical biochemistCustomer Service  Tobacco Use   Smoking status: Former    Packs/day: .1    Types: Cigarettes    Quit date: 10/26/2019    Years since quitting: 3.2   Smokeless tobacco: Never  Vaping Use   Vaping Use: Never used  Substance and Sexual Activity   Alcohol use: Yes    Comment: occasionally   Drug use: Yes    Types: Marijuana    Comment: occasionally   Sexual activity: Yes    Birth control/protection: Surgical

## 2023-03-17 ENCOUNTER — Other Ambulatory Visit (INDEPENDENT_AMBULATORY_CARE_PROVIDER_SITE_OTHER): Payer: Self-pay | Admitting: Primary Care

## 2023-03-19 NOTE — Telephone Encounter (Signed)
Requested medication (s) are due for refill today: routing for review  Requested medication (s) are on the active medication list: no  Last refill 02/17/23    Future visit scheduled: no  Notes to clinic:  Unable to refill per protocol, last refill by another provider.  Historical medication, routing for approval.     Requested Prescriptions  Pending Prescriptions Disp Refills   ibuprofen (ADVIL) 800 MG tablet [Pharmacy Med Name: IBUPROFEN 800 MG TABLET] 90 tablet     Sig: TAKE 1 TABLET BY MOUTH EVERY 8 HOURS AS NEEDED FOR PAIN     Analgesics:  NSAIDS Failed - 03/17/2023 12:14 AM      Failed - Manual Review: Labs are only required if the patient has taken medication for more than 8 weeks.      Failed - HGB in normal range and within 360 days    Hemoglobin  Date Value Ref Range Status  01/11/2023 10.4 (L) 11.1 - 15.9 g/dL Final         Failed - HCT in normal range and within 360 days    Hematocrit  Date Value Ref Range Status  01/11/2023 33.6 (L) 34.0 - 46.6 % Final         Passed - Cr in normal range and within 360 days    Creatinine, Ser  Date Value Ref Range Status  09/21/2022 0.68 0.57 - 1.00 mg/dL Final         Passed - PLT in normal range and within 360 days    Platelets  Date Value Ref Range Status  01/11/2023 387 150 - 450 x10E3/uL Final         Passed - eGFR is 30 or above and within 360 days    GFR calc Af Amer  Date Value Ref Range Status  02/17/2020 >60 >60 mL/min Final   GFR, Estimated  Date Value Ref Range Status  03/16/2022 >60 >60 mL/min Final    Comment:    (NOTE) Calculated using the CKD-EPI Creatinine Equation (2021)    eGFR  Date Value Ref Range Status  09/21/2022 109 >59 mL/min/1.73 Final         Passed - Patient is not pregnant      Passed - Valid encounter within last 12 months    Recent Outpatient Visits           1 month ago Rectal bleeding   [place] [facility] Grayce Sessions, NP   2 months ago  Rectal bleeding   [place] [facility] Grayce Sessions, NP   5 months ago Morbidly obese [facility])   [place] [facility] Grayce Sessions, NP   5 months ago Essential hypertension   [place] [facility] Grayce Sessions, NP   7 months ago Need for hepatitis C screening test   [facility] Grayce Sessions, NP       Future Appointments             In 2 weeks Eldred Manges, MD [facility]

## 2023-03-20 NOTE — Telephone Encounter (Signed)
Will forward to provider  

## 2023-04-06 ENCOUNTER — Ambulatory Visit: Payer: BC Managed Care – PPO | Admitting: Orthopaedic Surgery

## 2023-04-26 LAB — HM COLONOSCOPY

## 2023-06-07 ENCOUNTER — Ambulatory Visit (INDEPENDENT_AMBULATORY_CARE_PROVIDER_SITE_OTHER): Payer: BC Managed Care – PPO | Admitting: Primary Care

## 2023-06-07 ENCOUNTER — Encounter (INDEPENDENT_AMBULATORY_CARE_PROVIDER_SITE_OTHER): Payer: Self-pay | Admitting: Primary Care

## 2023-06-07 VITALS — BP 133/84 | HR 85 | Resp 16 | Wt 292.4 lb

## 2023-06-07 DIAGNOSIS — F32A Depression, unspecified: Secondary | ICD-10-CM

## 2023-06-07 DIAGNOSIS — I10 Essential (primary) hypertension: Secondary | ICD-10-CM | POA: Diagnosis not present

## 2023-06-07 DIAGNOSIS — Z760 Encounter for issue of repeat prescription: Secondary | ICD-10-CM

## 2023-06-07 DIAGNOSIS — F4310 Post-traumatic stress disorder, unspecified: Secondary | ICD-10-CM | POA: Diagnosis not present

## 2023-06-07 DIAGNOSIS — F419 Anxiety disorder, unspecified: Secondary | ICD-10-CM | POA: Diagnosis not present

## 2023-06-07 MED ORDER — CLONAZEPAM 1 MG PO TABS: 1.0000 mg | ORAL_TABLET | Freq: Two times a day (BID) | ORAL | 1 refills | Status: DC | PRN

## 2023-06-07 MED ORDER — IBUPROFEN 800 MG PO TABS: 800.0000 mg | ORAL_TABLET | Freq: Three times a day (TID) | ORAL | 0 refills | Status: DC | PRN

## 2023-06-07 MED ORDER — IRBESARTAN-HYDROCHLOROTHIAZIDE 150-12.5 MG PO TABS: 1.0000 | ORAL_TABLET | Freq: Every day | ORAL | 1 refills | Status: DC

## 2023-06-07 NOTE — Progress Notes (Signed)
[facility]  Morgan Molina, is a 47 y.o. female  MVH:[id number]  WUX:[id number]  DOB - Mar 14, 1976  Chief Complaint  Patient presents with   Hypertension       Subjective:   [name] is a 47 y.o. female here today for a follow up visit for HTN. Patient has No headache, No chest pain, No abdominal pain - No Nausea, No new weakness tingling or numbness, No Cough - shortness of breath.  No problems updated.  Allergies  Allergen Reactions   Latex Dermatitis   Lisinopril Cough   Tramadol Nausea And Vomiting    ALL   Adhesive [Tape] Rash    Past Medical History:  Diagnosis Date   Anemia    Anxiety    Arthritis    Depression    Dyspnea    Headache    Hypertension    Muscle spasm    PTSD (post-traumatic stress disorder) 09/28/2019   Syncope     Current Outpatient Medications on File Prior to Visit  Medication Sig Dispense Refill   methocarbamol (ROBAXIN) 500 MG tablet Take 1 tablet (500 mg total) by mouth every 6 (six) hours as needed for muscle spasms. (Patient not taking: Reported on 12/27/2022) 60 tablet 0   ondansetron (ZOFRAN) 4 MG tablet Take 1 tablet (4 mg total) by mouth every 8 (eight) hours as needed for nausea or vomiting. 20 tablet 0   oxyCODONE-acetaminophen (PERCOCET/ROXICET) 5-325 MG tablet Take 1 tablet by mouth every 4 (four) hours as needed for severe pain. (Patient not taking: Reported on 12/27/2022) 40 tablet 0   phenylephrine (,USE FOR PREPARATION-H,) 0.25 % suppository Place 1 suppository rectally 2 (two) times daily. 12 suppository 0   No current facility-administered medications on file prior to visit.    Objective:   Vitals:   06/07/23 1012  BP: 133/84  Pulse: 85  Resp: 16  SpO2: 100%  Weight: 292 lb 6.4 oz (132.6 kg)    Comprehensive ROS Pertinent positive and negative noted in HPI   Exam General appearance : Awake, alert, not in any distress. Speech Clear. Not toxic looking HEENT: Atraumatic and  Normocephalic, pupils equally reactive to light and accomodation Neck: Supple, no JVD. No cervical lymphadenopathy.  Chest: Good air entry bilaterally, no added sounds  CVS: S1 S2 regular, no murmurs.  Abdomen: Bowel sounds present, Non tender and not distended with no gaurding, rigidity or rebound. Extremities: B/L Lower Ext shows no edema, both legs are warm to touch Neurology: Awake alert, and oriented X 3, CN II-XII intact, Non focal Skin: No Rash  Data Review Lab Results  Component Value Date   HGBA1C 5.9 07/12/2022   HGBA1C 5.8 (A) 01/25/2022   HGBA1C 5.9 (H) 06/16/2021    Assessment & Plan  [name] was seen today for hypertension.  Diagnoses and all orders for this visit:  Essential hypertension BP goal - < 130/80 Explained that having normal blood pressure is the goal and medications are helping to get to goal and maintain normal blood pressure. DIET: Limit salt intake, read nutrition labels to check salt content, limit fried and high fatty foods  Avoid using multisymptom OTC cold preparations that generally contain sudafed which can rise BP. Consult with pharmacist on best cold relief products to use for persons with HTN EXERCISE Discussed incorporating exercise such as walking - 30 minutes most days of the week and can do in 10 minute intervals    -     irbesartan-hydrochlorothiazide (AVALIDE) 150-12.5 MG tablet;  Take 1 tablet by mouth daily.  Anxiety and depression Controlled  -     clonazePAM (KLONOPIN) 1 MG tablet; Take 1 tablet (1 mg total) by mouth 2 (two) times daily as needed for anxiety.  Medication refill -     clonazePAM (KLONOPIN) 1 MG tablet; Take 1 tablet (1 mg total) by mouth 2 (two) times daily as needed for anxiety. -     ibuprofen (ADVIL) 800 MG tablet; Take 1 tablet (800 mg total) by mouth every 8 (eight) hours as needed. for pain -     irbesartan-hydrochlorothiazide (AVALIDE) 150-12.5 MG tablet; Take 1 tablet by mouth daily.  PTSD (post-traumatic  stress disorder) -     clonazePAM (KLONOPIN) 1 MG tablet; Take 1 tablet (1 mg total) by mouth 2 (two) times daily as needed for anxiety. -     ibuprofen (ADVIL) 800 MG tablet; Take 1 tablet (800 mg total) by mouth every 8 (eight) hours as needed. for pain    Patient have been counseled extensively about nutrition and exercise. Other issues discussed during this visit include: low cholesterol diet, weight control and daily exercise, foot care, annual eye examinations at Ophthalmology, importance of adherence with medications and regular follow-up. We also discussed long term complications of uncontrolled diabetes and hypertension.   Return in about 3 months (around 09/07/2023) for medical conditions.  The patient was given clear instructions to go to ER or return to medical center if symptoms don't improve, worsen or new problems develop. The patient verbalized understanding. The patient was told to call to get lab results if they haven't heard anything in the next week.   This note has been created with Education officer, environmental. Any transcriptional errors are unintentional.   Grayce Sessions, NP 06/10/2023, 7:43 PM

## 2023-06-08 ENCOUNTER — Other Ambulatory Visit (INDEPENDENT_AMBULATORY_CARE_PROVIDER_SITE_OTHER): Payer: BC Managed Care – PPO

## 2023-06-08 ENCOUNTER — Ambulatory Visit (INDEPENDENT_AMBULATORY_CARE_PROVIDER_SITE_OTHER): Payer: BC Managed Care – PPO | Admitting: Orthopaedic Surgery

## 2023-06-08 VITALS — BP 121/79 | HR 104 | Ht 64.0 in | Wt 289.0 lb

## 2023-06-08 DIAGNOSIS — M5440 Lumbago with sciatica, unspecified side: Secondary | ICD-10-CM | POA: Diagnosis not present

## 2023-06-08 DIAGNOSIS — Z981 Arthrodesis status: Secondary | ICD-10-CM

## 2023-06-08 DIAGNOSIS — G8929 Other chronic pain: Secondary | ICD-10-CM

## 2023-06-08 NOTE — Progress Notes (Unsigned)
Office Visit Note   Patient: Morgan Molina           Date of Birth: Feb 16, 1976           MRN: [id number] Visit Date: 06/08/2023              Requested by: Grayce Sessions, NP [address] [place],  [place] [address] PCP: Grayce Sessions, NP   Assessment & Plan: Visit Diagnoses:  1. Status post cervical spinal fusion   2. Chronic bilateral low back pain with sciatica, sciatica laterality unspecified     Plan: Will repeat her lumbar epidural which gave her 70+ percent relief with Dr. Alvester Morin.  She will continue to try to work on weight loss used ibuprofen intermittently for the pain.  She can follow-up with me in a couple months.  Follow-Up Instructions: Return in about 4 months (around 10/08/2023).   Orders:  Orders Placed This Encounter  Procedures   XR Cervical Spine 2 or 3 views   Ambulatory referral to Physical Medicine Rehab   No orders of the defined types were placed in this encounter.     Procedures: No procedures performed   Clinical Data: No additional findings.   Subjective: Chief Complaint  Patient presents with   Neck - Follow-up    10/04/2022 C3-4, C4-5, C5-6, C6-7 ACDF   Lower Back - Pain    HPI 47 year old female returns post November 2023 fusion C3-C7 4 level.  Repeat x-rays today demonstrates all 3 upper levels have incorporated well bottom level is healing more slowly but no loosening of the screws.  Patient states had previous lumbar injection which gave her some relief for a few months 70%.  She is use some ibuprofen.  She notices stiffness in her neck but good relief of preop numbness and tingling in her hands.  We have followed for several years and she had some mild stenosis at L3-4 with moderate canal stenosis at L2-3 and bilateral L2 foramina.  Right foraminal extraforaminal disc protrusion L3-4 and L4-5.  Facet arthrosis.  BMI is 49 again we discussed that we are encouraging efforts to lose some weight to help unload her  lumbar spine.  Review of Systems updated unchanged.   Objective: Vital Signs: BP 121/79   Pulse (!) 104   Ht 5\' 4"  (1.626 m)   Wt 289 lb (131.1 kg)   BMI 49.61 kg/m   Physical Exam Constitutional:      Appearance: She is well-developed.  HENT:     Head: Normocephalic.     Right Ear: External ear normal.     Left Ear: External ear normal. There is no impacted cerumen.  Eyes:     Pupils: Pupils are equal, round, and reactive to light.  Neck:     Thyroid: No thyromegaly.     Trachea: No tracheal deviation.  Cardiovascular:     Rate and Rhythm: Normal rate.  Pulmonary:     Effort: Pulmonary effort is normal.  Abdominal:     Palpations: Abdomen is soft.  Musculoskeletal:     Cervical back: No rigidity.  Skin:    General: Skin is warm and dry.  Neurological:     Mental Status: She is alert and oriented to person, place, and time.  Psychiatric:        Behavior: Behavior normal.     Ortho Exam anterior tib EHL is intact.  Sensations intact dorsum lateral foot.  No quad weakness right or left.  Specialty Comments:  MRI LUMBAR SPINE WITHOUT CONTRAST   TECHNIQUE: Multiplanar, multisequence MR imaging of the lumbar spine was performed. No intravenous contrast was administered.   COMPARISON:  Prior radiograph from 08/04/2021   FINDINGS: Segmentation: Standard. Lowest well-formed disc space labeled the L5-S1 level.   Alignment: Trace degenerative anterolisthesis of L3 on L4 and L4 on L5. Alignment otherwise normal preservation of the normal lumbar lordosis.   Vertebrae: Vertebral body height maintained without acute or chronic fracture. Bone marrow signal intensity diffusely decreased on T1 weighted imaging, nonspecific, but most commonly related to anemia, smoking, or obesity. Mild reactive endplate change present about the L2-3 interspace. Reactive marrow edema present about the right greater than left L4-5 facets due to facet arthritis. No other abnormal marrow  edema.   Conus medullaris and cauda equina: Conus extends to the L1-2 level. Conus and cauda equina appear normal.   Paraspinal and other soft tissues: Paraspinous soft tissues demonstrate no acute finding. Visualized visceral structures unremarkable.   Disc levels:   T11-12: Seen only on sagittal projection. Mild disc bulge with disc desiccation. Bilateral facet hypertrophy. No significant spinal stenosis. Moderate bilateral foraminal narrowing.   T12-L1: Normal interspace. Mild bilateral facet hypertrophy. No stenosis.   L1-2: Negative interspace. Mild bilateral facet hypertrophy. No stenosis.   L2-3: Degenerative intervertebral disc space narrowing with diffuse disc bulge and disc desiccation. Superimposed biforaminal disc protrusions, right slightly larger than left (series 6, image 15). Superimposed small left subarticular component noted on the left (series 6, image 16). Moderate bilateral facet hypertrophy. Mild prominence of the dorsal epidural fat. Resultant moderate spinal stenosis. Moderate bilateral L2 foraminal narrowing.   L3-4: Trace listhesis. Diffuse disc bulge with disc desiccation. Superimposed right foraminal disc protrusion contacts the exiting right L3 nerve root (series 6, image 19). Moderate bilateral facet hypertrophy. Resultant mild spinal stenosis. Mild left with moderate right L3 foraminal narrowing.   L4-5: Right foraminal to extraforaminal disc protrusion contacts the exiting right L4 nerve root as it courses of the right neural foramen (series 6, image 24). Severe right worse than left facet arthrosis. No significant spinal stenosis. Mild left with moderate right L4 foraminal narrowing.   L5-S1: Negative interspace. Severe left with moderate right facet arthrosis. No significant spinal stenosis. Foramina remain patent.   IMPRESSION: 1. Multifactorial degenerative changes at L2-3 with resultant moderate canal and bilateral L2 foraminal  stenosis. 2. Right foraminal to extraforaminal disc protrusions at L3-4 and L4-5, contacting and potentially affecting the exiting right L3 and L4 nerve roots respectively. 3. Moderate to severe lower lumbar facet arthrosis, most pronounced at L4-5 on the right and L5-S1 on the left. Findings could contribute to lower back pain.     Electronically Signed   By: Rise Mu M.D.   On: 10/03/2021 01:47  Imaging: No results found.   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   Ganglion cyst of dorsum of left wrist  10/01/2019   Decreased visual acuity 10/01/2019   PTSD (post-traumatic stress disorder) 09/28/2019   Nipple discharge 04/02/2019   Panic attacks 02/19/2019   Bilateral low back pain 02/19/2019   Seasonal allergies 02/19/2019   Anxiety and depression 01/14/2019   Syncope 01/14/2019   Breast mass, right 01/14/2019   Hypertension    Past Medical History:  Diagnosis Date   Anemia    Anxiety    Arthritis    Depression    Dyspnea    Headache    Hypertension    Muscle spasm    PTSD (post-traumatic stress disorder) 09/28/2019   Syncope     Family History  Problem Relation Age of Onset   Cancer Mother    Hypertension Mother    Heart attack Mother    Breast cancer Mother 34   CAD Father    Hypertension Father    Diabetes Father     Past Surgical History:   Procedure Laterality Date   ANTERIOR CERVICAL DECOMP/DISCECTOMY FUSION N/A 10/04/2022   Procedure: CERVICAL THREE-FOUR, CERVICAL FOUR-FIVE, CERVICAL FIVE-SIX, CERVICAL SIX-SEVEN, ANTERIOR CERVICAL DISCECTOMY FUSION, ALLOGRAFT, PLATE;  Surgeon: Eldred Manges, MD;  Location: [facility];  Service: Orthopedics;  Laterality: N/A;   BREAST EXCISIONAL BIOPSY Right    CARPAL TUNNEL RELEASE Left 02/20/2020   Procedure: LEFT CARPAL TUNNEL RELEASE;  Surgeon: Betha Loa, MD;  Location: [facility];  Service: Orthopedics;  Laterality: Left;   CARPAL TUNNEL RELEASE Right 02/28/2021   Procedure: CARPAL TUNNEL RELEASE;  Surgeon: Betha Loa, MD;  Location: [facility];  Service: Orthopedics;  Laterality: Right;   CHOLECYSTECTOMY     GANGLION CYST EXCISION Left 02/20/2020   Procedure: LEFT DORSAL GANGLION EXCISION;  Surgeon: Betha Loa, MD;  Location: [facility];  Service: Orthopedics;  Laterality: Left;   TRIGGER FINGER RELEASE Bilateral 08/29/2021   Procedure: RELEASE TRIGGER FINGER/A-1 PULLEY BILATERAL THUMBS;  Surgeon: Betha Loa, MD;  Location: [facility];  Service: Orthopedics;  Laterality: Bilateral;  30 MIN   TUBAL LIGATION     Social History   Occupational History   Occupation: Clinical biochemist  Tobacco Use   Smoking status: Former    Current packs/day: 0.00    Types: Cigarettes    Quit date: 10/26/2019    Years since quitting: 3.6   Smokeless tobacco: Never  Vaping Use   Vaping status: Never Used  Substance and Sexual Activity   Alcohol use: Yes    Comment: occasionally   Drug use: Yes    Types: Marijuana    Comment: occasionally   Sexual activity: Yes    Birth control/protection: Surgical

## 2023-06-12 ENCOUNTER — Telehealth (INDEPENDENT_AMBULATORY_CARE_PROVIDER_SITE_OTHER): Payer: Self-pay

## 2023-06-12 NOTE — Telephone Encounter (Signed)
MyChart message

## 2023-06-15 ENCOUNTER — Telehealth: Payer: Self-pay | Admitting: Physical Medicine and Rehabilitation

## 2023-06-15 NOTE — Telephone Encounter (Signed)
Patient returned call asked for a call back to schedule an appointment with Dr. Alvester Morin. The number to contact patient is [phone]

## 2023-06-18 ENCOUNTER — Telehealth: Payer: Self-pay | Admitting: Orthopaedic Surgery

## 2023-06-18 ENCOUNTER — Telehealth: Payer: Self-pay

## 2023-06-18 NOTE — Telephone Encounter (Signed)
Patient called. Would like Betsy to call her back.

## 2023-06-18 NOTE — Telephone Encounter (Signed)
Patient would like to speak with Tamela Oddi to see if something can be done for her pain. Patient is scheduled for injection on 07/03/23

## 2023-06-18 NOTE — Telephone Encounter (Signed)
Spoke with patient and scheduled injection for 07/03/23. Patient aware driver needed

## 2023-06-19 ENCOUNTER — Other Ambulatory Visit (HOSPITAL_BASED_OUTPATIENT_CLINIC_OR_DEPARTMENT_OTHER): Payer: Self-pay | Admitting: Student

## 2023-06-19 MED ORDER — ACETAMINOPHEN-CODEINE 300-30 MG PO TABS: 1.0000 | ORAL_TABLET | Freq: Two times a day (BID) | ORAL | 0 refills | Status: DC

## 2023-06-19 NOTE — Telephone Encounter (Signed)
Morgan Molina can you please send this in for me?

## 2023-06-19 NOTE — Telephone Encounter (Signed)
Ok to send in tylenol 3.  Can send in one tab bid prn pain #20 no refills.  thanks

## 2023-06-19 NOTE — Telephone Encounter (Signed)
Lvm for pt

## 2023-06-19 NOTE — Telephone Encounter (Signed)
See other message

## 2023-06-20 ENCOUNTER — Telehealth: Payer: Self-pay | Admitting: Orthopaedic Surgery

## 2023-06-20 NOTE — Telephone Encounter (Signed)
Pt would like a call to discuss her work accommodations please advise

## 2023-06-20 NOTE — Telephone Encounter (Signed)
Lvm for pt to cb 

## 2023-06-21 NOTE — Telephone Encounter (Signed)
Left another voice mail for cb

## 2023-06-25 ENCOUNTER — Encounter: Payer: Self-pay | Admitting: Orthopaedic Surgery

## 2023-06-26 ENCOUNTER — Telehealth: Payer: Self-pay | Admitting: Orthopaedic Surgery

## 2023-06-26 NOTE — Telephone Encounter (Signed)
Per Dr. Kathi Der last message, it was ok to write her out of work until seen again by Dr. Ophelia Charter. I called and advised pt and sent note to her mychart

## 2023-06-26 NOTE — Telephone Encounter (Signed)
Pt would like to know who she needs to speak with about her note stating she will be out of work for 4 weeks please advise

## 2023-07-03 ENCOUNTER — Ambulatory Visit: Payer: BC Managed Care – PPO | Admitting: Physical Medicine and Rehabilitation

## 2023-07-03 ENCOUNTER — Other Ambulatory Visit: Payer: Self-pay

## 2023-07-03 VITALS — BP 130/80 | HR 99

## 2023-07-03 DIAGNOSIS — M5416 Radiculopathy, lumbar region: Secondary | ICD-10-CM | POA: Diagnosis not present

## 2023-07-03 MED ORDER — METHYLPREDNISOLONE ACETATE 80 MG/ML IJ SUSP
80.0000 mg | Freq: Once | INTRAMUSCULAR | Status: AC
  Administered 2023-07-03: 80 mg

## 2023-07-03 NOTE — Progress Notes (Signed)
Functional Pain Scale - descriptive words and definitions  Unmanageable (7)  Pain interferes with normal ADL's/nothing seems to help/sleep is very difficult/active distractions are very difficult to concentrate on. Severe range order  Average Pain 8-10   +Driver, -BT, -Dye Allergies.  Lower back pain on both sides with radiation in the legs

## 2023-07-03 NOTE — Patient Instructions (Signed)

## 2023-07-04 ENCOUNTER — Telehealth (INDEPENDENT_AMBULATORY_CARE_PROVIDER_SITE_OTHER): Payer: Self-pay | Admitting: Primary Care

## 2023-07-04 NOTE — Telephone Encounter (Signed)
Will forward to provider  

## 2023-07-04 NOTE — Telephone Encounter (Signed)
Sonia with [employer] is calling in to check the status of Job accomodation for this pt. Lissa Hoard is requesting Marcelino Duster give her a call. [phone]

## 2023-07-04 NOTE — Telephone Encounter (Signed)
Copied from CRM [id number]. Topic: General - Other >> Jun 29, 2023  2:25 PM Turkey B wrote: Reason for CRM: Sonia from [employer] hr dept , called in about FMLA form sent back from Dr Randa Evens. She has questions about it. Please call back

## 2023-07-04 NOTE — Telephone Encounter (Signed)
Spoke with Lissa Hoard clarified questions

## 2023-07-07 NOTE — Procedures (Signed)
Lumbar Epidural Steroid Injection - Interlaminar Approach with Fluoroscopic Guidance  Patient: Morgan Molina      Date of Birth: May 31, 1976 MRN: [id number] PCP: Grayce Sessions, NP      Visit Date: 07/03/2023   Universal Protocol:     Consent Given By: the patient  Position: PRONE  Additional Comments: Vital signs were monitored before and after the procedure. Patient was prepped and draped in the usual sterile fashion. The correct patient, procedure, and site was verified.   Injection Procedure Details:   Procedure diagnoses: Lumbar radiculopathy [M54.16]   Meds Administered:  Meds ordered this encounter  Medications   methylPREDNISolone acetate (DEPO-MEDROL) injection 80 mg     Laterality: Right  Location/Site:  L3-4  Needle: 3.5 in., 20 ga. Tuohy  Needle Placement: Paramedian epidural  Findings:   -Comments: Excellent flow of contrast into the epidural space.  Procedure Details: Using a paramedian approach from the side mentioned above, the region overlying the inferior lamina was localized under fluoroscopic visualization and the soft tissues overlying this structure were infiltrated with 4 ml. of 1% Lidocaine without Epinephrine. The Tuohy needle was inserted into the epidural space using a paramedian approach.   The epidural space was localized using loss of resistance along with counter oblique bi-planar fluoroscopic views.  After negative aspirate for air, blood, and CSF, a 2 ml. volume of Isovue-250 was injected into the epidural space and the flow of contrast was observed. Radiographs were obtained for documentation purposes.    The injectate was administered into the level noted above.   Additional Comments:  No complications occurred Dressing: 2 x 2 sterile gauze and Band-Aid    Post-procedure details: Patient was observed during the procedure. Post-procedure instructions were reviewed.  Patient left the clinic in stable condition.

## 2023-07-07 NOTE — Progress Notes (Signed)
Morgan Molina - 47 y.o. female MRN [id number]  Date of birth: 05/01/1976  Office Visit Note: Visit Date: 07/03/2023 PCP: Grayce Sessions, NP Referred by: Grayce Sessions, NP  Subjective: Chief Complaint  Patient presents with   Lower Back - Pain   HPI:  Morgan Molina is a 47 y.o. female who comes in today at the request of Dr. Annell Greening for planned Right L3-4 Lumbar Interlaminar epidural steroid injection with fluoroscopic guidance.  The patient has failed conservative care including home exercise, medications, time and activity modification.  This injection will be diagnostic and hopefully therapeutic.  Please see requesting physician notes for further details and justification.   ROS Otherwise per HPI.  Assessment & Plan: Visit Diagnoses:    ICD-10-CM   1. Lumbar radiculopathy  M54.16 XR C-ARM NO REPORT    Epidural Steroid injection    methylPREDNISolone acetate (DEPO-MEDROL) injection 80 mg      Plan: No additional findings.   Meds & Orders:  Meds ordered this encounter  Medications   methylPREDNISolone acetate (DEPO-MEDROL) injection 80 mg    Orders Placed This Encounter  Procedures   XR C-ARM NO REPORT   Epidural Steroid injection    Follow-up: Return for visit to requesting provider as needed.   Procedures: No procedures performed  Lumbar Epidural Steroid Injection - Interlaminar Approach with Fluoroscopic Guidance  Patient: Morgan Molina      Date of Birth: Oct 31, 1976 MRN: [id number] PCP: Grayce Sessions, NP      Visit Date: 07/03/2023   Universal Protocol:     Consent Given By: the patient  Position: PRONE  Additional Comments: Vital signs were monitored before and after the procedure. Patient was prepped and draped in the usual sterile fashion. The correct patient, procedure, and site was verified.   Injection Procedure Details:   Procedure diagnoses: Lumbar radiculopathy [M54.16]   Meds Administered:  Meds ordered this  encounter  Medications   methylPREDNISolone acetate (DEPO-MEDROL) injection 80 mg     Laterality: Right  Location/Site:  L3-4  Needle: 3.5 in., 20 ga. Tuohy  Needle Placement: Paramedian epidural  Findings:   -Comments: Excellent flow of contrast into the epidural space.  Procedure Details: Using a paramedian approach from the side mentioned above, the region overlying the inferior lamina was localized under fluoroscopic visualization and the soft tissues overlying this structure were infiltrated with 4 ml. of 1% Lidocaine without Epinephrine. The Tuohy needle was inserted into the epidural space using a paramedian approach.   The epidural space was localized using loss of resistance along with counter oblique bi-planar fluoroscopic views.  After negative aspirate for air, blood, and CSF, a 2 ml. volume of Isovue-250 was injected into the epidural space and the flow of contrast was observed. Radiographs were obtained for documentation purposes.    The injectate was administered into the level noted above.   Additional Comments:  No complications occurred Dressing: 2 x 2 sterile gauze and Band-Aid    Post-procedure details: Patient was observed during the procedure. Post-procedure instructions were reviewed.  Patient left the clinic in stable condition.   Clinical History: MRI LUMBAR SPINE WITHOUT CONTRAST   TECHNIQUE: Multiplanar, multisequence MR imaging of the lumbar spine was performed. No intravenous contrast was administered.   COMPARISON:  Prior radiograph from 08/04/2021   FINDINGS: Segmentation: Standard. Lowest well-formed disc space labeled the L5-S1 level.   Alignment: Trace degenerative anterolisthesis of L3 on L4 and L4 on L5. Alignment otherwise normal  preservation of the normal lumbar lordosis.   Vertebrae: Vertebral body height maintained without acute or chronic fracture. Bone marrow signal intensity diffusely decreased on T1 weighted imaging,  nonspecific, but most commonly related to anemia, smoking, or obesity. Mild reactive endplate change present about the L2-3 interspace. Reactive marrow edema present about the right greater than left L4-5 facets due to facet arthritis. No other abnormal marrow edema.   Conus medullaris and cauda equina: Conus extends to the L1-2 level. Conus and cauda equina appear normal.   Paraspinal and other soft tissues: Paraspinous soft tissues demonstrate no acute finding. Visualized visceral structures unremarkable.   Disc levels:   T11-12: Seen only on sagittal projection. Mild disc bulge with disc desiccation. Bilateral facet hypertrophy. No significant spinal stenosis. Moderate bilateral foraminal narrowing.   T12-L1: Normal interspace. Mild bilateral facet hypertrophy. No stenosis.   L1-2: Negative interspace. Mild bilateral facet hypertrophy. No stenosis.   L2-3: Degenerative intervertebral disc space narrowing with diffuse disc bulge and disc desiccation. Superimposed biforaminal disc protrusions, right slightly larger than left (series 6, image 15). Superimposed small left subarticular component noted on the left (series 6, image 16). Moderate bilateral facet hypertrophy. Mild prominence of the dorsal epidural fat. Resultant moderate spinal stenosis. Moderate bilateral L2 foraminal narrowing.   L3-4: Trace listhesis. Diffuse disc bulge with disc desiccation. Superimposed right foraminal disc protrusion contacts the exiting right L3 nerve root (series 6, image 19). Moderate bilateral facet hypertrophy. Resultant mild spinal stenosis. Mild left with moderate right L3 foraminal narrowing.   L4-5: Right foraminal to extraforaminal disc protrusion contacts the exiting right L4 nerve root as it courses of the right neural foramen (series 6, image 24). Severe right worse than left facet arthrosis. No significant spinal stenosis. Mild left with moderate right L4 foraminal  narrowing.   L5-S1: Negative interspace. Severe left with moderate right facet arthrosis. No significant spinal stenosis. Foramina remain patent.   IMPRESSION: 1. Multifactorial degenerative changes at L2-3 with resultant moderate canal and bilateral L2 foraminal stenosis. 2. Right foraminal to extraforaminal disc protrusions at L3-4 and L4-5, contacting and potentially affecting the exiting right L3 and L4 nerve roots respectively. 3. Moderate to severe lower lumbar facet arthrosis, most pronounced at L4-5 on the right and L5-S1 on the left. Findings could contribute to lower back pain.     Electronically Signed   By: Rise Mu M.D.   On: 10/03/2021 01:47     Objective:  VS:  HT:    WT:   BMI:     BP:130/80  HR:99bpm  TEMP: ( )  RESP:  Physical Exam Vitals and nursing note reviewed.  Constitutional:      General: She is not in acute distress.    Appearance: Normal appearance. She is not ill-appearing.  HENT:     Head: Normocephalic and atraumatic.     Right Ear: External ear normal.     Left Ear: External ear normal.  Eyes:     Extraocular Movements: Extraocular movements intact.  Cardiovascular:     Rate and Rhythm: Normal rate.     Pulses: Normal pulses.  Pulmonary:     Effort: Pulmonary effort is normal. No respiratory distress.  Abdominal:     General: There is no distension.     Palpations: Abdomen is soft.  Musculoskeletal:        General: Tenderness present.     Cervical back: Neck supple.     Right lower leg: No edema.     Left  lower leg: No edema.     Comments: Patient has good distal strength with no pain over the greater trochanters.  No clonus or focal weakness.  Skin:    Findings: No erythema, lesion or rash.  Neurological:     General: No focal deficit present.     Mental Status: She is alert and oriented to person, place, and time.     Sensory: No sensory deficit.     Motor: No weakness or abnormal muscle tone.     Coordination:  Coordination normal.  Psychiatric:        Mood and Affect: Mood normal.        Behavior: Behavior normal.      Imaging: No results found.

## 2023-07-11 ENCOUNTER — Telehealth: Payer: Self-pay | Admitting: Orthopaedic Surgery

## 2023-07-11 NOTE — Telephone Encounter (Signed)
Pt disability paperwork is in the Media tab to be completed, Tamela Oddi was working on it personally previously

## 2023-07-13 ENCOUNTER — Ambulatory Visit (INDEPENDENT_AMBULATORY_CARE_PROVIDER_SITE_OTHER): Payer: BC Managed Care – PPO | Admitting: Orthopaedic Surgery

## 2023-07-13 VITALS — BP 128/75 | HR 79

## 2023-07-13 DIAGNOSIS — M5442 Lumbago with sciatica, left side: Secondary | ICD-10-CM

## 2023-07-13 DIAGNOSIS — Z981 Arthrodesis status: Secondary | ICD-10-CM | POA: Diagnosis not present

## 2023-07-13 DIAGNOSIS — M48061 Spinal stenosis, lumbar region without neurogenic claudication: Secondary | ICD-10-CM | POA: Insufficient documentation

## 2023-07-13 DIAGNOSIS — M5441 Lumbago with sciatica, right side: Secondary | ICD-10-CM | POA: Diagnosis not present

## 2023-07-13 DIAGNOSIS — M48062 Spinal stenosis, lumbar region with neurogenic claudication: Secondary | ICD-10-CM

## 2023-07-13 DIAGNOSIS — G8929 Other chronic pain: Secondary | ICD-10-CM

## 2023-07-13 NOTE — Progress Notes (Signed)
Office Visit Note   Patient: Morgan Molina           Date of Birth: 1976/11/06           MRN: [id number] Visit Date: 07/13/2023              Requested by: Grayce Sessions, NP [address] [place],  [place] [address] PCP: Grayce Sessions, NP   Assessment & Plan: Visit Diagnoses:  1. Chronic bilateral low back pain with bilateral sciatica   2. Status post cervical spinal fusion   3. Spinal stenosis of lumbar region with neurogenic claudication     Plan: Will set patient up for some physical therapy work slip given no work x 2 months.  She states she is considering disability.  Will check her in 8 weeks.  She will continue to walk is much as she can sit rest and repeat.  Try to work on some weight loss up unload her lower back.  Follow-Up Instructions: Return in about 8 weeks (around 09/07/2023).   Orders:  Orders Placed This Encounter  Procedures   Ambulatory referral to Physical Therapy   No orders of the defined types were placed in this encounter.     Procedures: No procedures performed   Clinical Data: No additional findings.   Subjective: Chief Complaint  Patient presents with   Neck - Pain    HPI 47 year old female returns she had 4 level cervical fusion November 2023.  She has missed a lot of work that she is only had 300 some hours of the 1200 normal for that time period.  Ongoing problems with back pain she has had recent epidural with slight improvement she has trouble sleeping she sleeps on the floor has problems sleeping in bed pain that radiates from her neck into her shoulders.  Back pain that radiates down her leg and previous MRI scan showed moderate stenosis L2-3 mild to moderate at 3 4 facet arthropathy bilateral at L2 bilateral L5-S1 and worse on the right at L4-5.  Patient's children are gone.  Past history includes problems with anxiety carpal tunnel, PTSD, send to be unstable angina.  No associated bowel or bladder  symptoms.  Review of Systems all systems noncontributory to HPI.   Objective: Vital Signs: BP 128/75   Pulse 79   Physical Exam Constitutional:      Appearance: She is well-developed.  HENT:     Head: Normocephalic.     Right Ear: External ear normal.     Left Ear: External ear normal. There is no impacted cerumen.  Eyes:     Pupils: Pupils are equal, round, and reactive to light.  Neck:     Thyroid: No thyromegaly.     Trachea: No tracheal deviation.  Cardiovascular:     Rate and Rhythm: Normal rate.  Pulmonary:     Effort: Pulmonary effort is normal.  Abdominal:     Palpations: Abdomen is soft.  Musculoskeletal:     Cervical back: No rigidity.  Skin:    General: Skin is warm and dry.  Neurological:     Mental Status: She is alert and oriented to person, place, and time.  Psychiatric:        Behavior: Behavior normal.     Ortho Exam decreased cervical range of motion no atrophy upper extremities.  No impingement of the shoulders.  Normal heel-toe gait.  Short stride gait slow getting from sitting standing.  Anterior tib gastrocsoleus quads  are active and strong.  Negative logroll to hips.  Specialty Comments:  MRI LUMBAR SPINE WITHOUT CONTRAST   TECHNIQUE: Multiplanar, multisequence MR imaging of the lumbar spine was performed. No intravenous contrast was administered.   COMPARISON:  Prior radiograph from 08/04/2021   FINDINGS: Segmentation: Standard. Lowest well-formed disc space labeled the L5-S1 level.   Alignment: Trace degenerative anterolisthesis of L3 on L4 and L4 on L5. Alignment otherwise normal preservation of the normal lumbar lordosis.   Vertebrae: Vertebral body height maintained without acute or chronic fracture. Bone marrow signal intensity diffusely decreased on T1 weighted imaging, nonspecific, but most commonly related to anemia, smoking, or obesity. Mild reactive endplate change present about the L2-3 interspace. Reactive marrow edema  present about the right greater than left L4-5 facets due to facet arthritis. No other abnormal marrow edema.   Conus medullaris and cauda equina: Conus extends to the L1-2 level. Conus and cauda equina appear normal.   Paraspinal and other soft tissues: Paraspinous soft tissues demonstrate no acute finding. Visualized visceral structures unremarkable.   Disc levels:   T11-12: Seen only on sagittal projection. Mild disc bulge with disc desiccation. Bilateral facet hypertrophy. No significant spinal stenosis. Moderate bilateral foraminal narrowing.   T12-L1: Normal interspace. Mild bilateral facet hypertrophy. No stenosis.   L1-2: Negative interspace. Mild bilateral facet hypertrophy. No stenosis.   L2-3: Degenerative intervertebral disc space narrowing with diffuse disc bulge and disc desiccation. Superimposed biforaminal disc protrusions, right slightly larger than left (series 6, image 15). Superimposed small left subarticular component noted on the left (series 6, image 16). Moderate bilateral facet hypertrophy. Mild prominence of the dorsal epidural fat. Resultant moderate spinal stenosis. Moderate bilateral L2 foraminal narrowing.   L3-4: Trace listhesis. Diffuse disc bulge with disc desiccation. Superimposed right foraminal disc protrusion contacts the exiting right L3 nerve root (series 6, image 19). Moderate bilateral facet hypertrophy. Resultant mild spinal stenosis. Mild left with moderate right L3 foraminal narrowing.   L4-5: Right foraminal to extraforaminal disc protrusion contacts the exiting right L4 nerve root as it courses of the right neural foramen (series 6, image 24). Severe right worse than left facet arthrosis. No significant spinal stenosis. Mild left with moderate right L4 foraminal narrowing.   L5-S1: Negative interspace. Severe left with moderate right facet arthrosis. No significant spinal stenosis. Foramina remain patent.   IMPRESSION: 1.  Multifactorial degenerative changes at L2-3 with resultant moderate canal and bilateral L2 foraminal stenosis. 2. Right foraminal to extraforaminal disc protrusions at L3-4 and L4-5, contacting and potentially affecting the exiting right L3 and L4 nerve roots respectively. 3. Moderate to severe lower lumbar facet arthrosis, most pronounced at L4-5 on the right and L5-S1 on the left. Findings could contribute to lower back pain.     Electronically Signed   By: Rise Mu M.D.   On: 10/03/2021 01:47  Imaging: No results found.   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   Arthralgia 03/30/2021   Hyperglycemia 03/30/2021   Shortness of breath 03/30/2021   Bilateral chronic serous otitis media  08/15/2020   Eustachian tube dysfunction, bilateral 08/15/2020   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   Ganglion cyst of dorsum of left wrist 10/01/2019   Decreased visual acuity 10/01/2019   PTSD (post-traumatic stress disorder) 09/28/2019   Nipple discharge 04/02/2019   Panic attacks 02/19/2019   Bilateral low back pain 02/19/2019   Seasonal allergies 02/19/2019   Anxiety and depression 01/14/2019   Syncope 01/14/2019   Breast mass, right 01/14/2019   Hypertension    Past Medical History:  Diagnosis Date   Anemia    Anxiety    Arthritis    Depression    Dyspnea    Headache    Hypertension    Muscle spasm    PTSD (post-traumatic stress disorder) 09/28/2019   Syncope     Family History  Problem Relation  Age of Onset   Cancer Mother    Hypertension Mother    Heart attack Mother    Breast cancer Mother 81   CAD Father    Hypertension Father    Diabetes Father     Past Surgical History:  Procedure Laterality Date   ANTERIOR CERVICAL DECOMP/DISCECTOMY FUSION N/A 10/04/2022   Procedure: CERVICAL THREE-FOUR, CERVICAL FOUR-FIVE, CERVICAL FIVE-SIX, CERVICAL SIX-SEVEN, ANTERIOR CERVICAL DISCECTOMY FUSION, ALLOGRAFT, PLATE;  Surgeon: Eldred Manges, MD;  Location: [facility];  Service: Orthopedics;  Laterality: N/A;   BREAST EXCISIONAL BIOPSY Right    CARPAL TUNNEL RELEASE Left 02/20/2020   Procedure: LEFT CARPAL TUNNEL RELEASE;  Surgeon: Betha Loa, MD;  Location: [facility];  Service: Orthopedics;  Laterality: Left;   CARPAL TUNNEL RELEASE Right 02/28/2021   Procedure: CARPAL TUNNEL RELEASE;  Surgeon: Betha Loa, MD;  Location: [facility];  Service: Orthopedics;  Laterality: Right;   CHOLECYSTECTOMY     GANGLION CYST EXCISION Left 02/20/2020   Procedure: LEFT DORSAL GANGLION EXCISION;  Surgeon: Betha Loa, MD;  Location: [facility];  Service: Orthopedics;  Laterality: Left;   TRIGGER FINGER RELEASE Bilateral 08/29/2021   Procedure: RELEASE TRIGGER FINGER/A-1 PULLEY BILATERAL THUMBS;  Surgeon: Betha Loa, MD;  Location: [facility];  Service: Orthopedics;  Laterality: Bilateral;  30 MIN   TUBAL LIGATION     Social History   Occupational History   Occupation: Clinical biochemist  Tobacco Use   Smoking status: Former    Current packs/day: 0.00    Types: Cigarettes    Quit date: 10/26/2019    Years since quitting: 3.7   Smokeless tobacco: Never  Vaping Use   Vaping status: Never Used  Substance and Sexual Activity   Alcohol use: Yes    Comment: occasionally   Drug use: Yes    Types: Marijuana    Comment: occasionally   Sexual activity: Yes    Birth control/protection: Surgical

## 2023-08-08 ENCOUNTER — Other Ambulatory Visit: Payer: Self-pay

## 2023-08-08 ENCOUNTER — Encounter: Payer: Self-pay | Admitting: Physical Therapy

## 2023-08-08 ENCOUNTER — Ambulatory Visit (INDEPENDENT_AMBULATORY_CARE_PROVIDER_SITE_OTHER): Payer: BC Managed Care – PPO | Admitting: Physical Therapy

## 2023-08-08 DIAGNOSIS — M6281 Muscle weakness (generalized): Secondary | ICD-10-CM | POA: Diagnosis not present

## 2023-08-08 DIAGNOSIS — R262 Difficulty in walking, not elsewhere classified: Secondary | ICD-10-CM

## 2023-08-08 DIAGNOSIS — M546 Pain in thoracic spine: Secondary | ICD-10-CM | POA: Diagnosis not present

## 2023-08-08 DIAGNOSIS — M542 Cervicalgia: Secondary | ICD-10-CM

## 2023-08-08 DIAGNOSIS — M5459 Other low back pain: Secondary | ICD-10-CM

## 2023-08-08 NOTE — Therapy (Signed)
OUTPATIENT PHYSICAL THERAPY  THORACOLUMBAR, CERVICAL EVALUATION   Patient Name: BRAILYNN MILOTA MRN: [id number] DOB:[birth year], 47 y.o., female Today's Date: 08/08/2023  END OF SESSION:  PT End of Session - 08/08/23 1159     Visit Number 1    Number of Visits 20    Date for PT Re-Evaluation 10/19/23    PT Start Time 0804    PT Stop Time 0845    PT Time Calculation (min) 41 min    Activity Tolerance Patient limited by pain    Behavior During Therapy Anxious;Restless             Past Medical History:  Diagnosis Date   Anemia    Anxiety    Arthritis    Depression    Dyspnea    Headache    Hypertension    Muscle spasm    PTSD (post-traumatic stress disorder) 09/28/2019   Syncope    Past Surgical History:  Procedure Laterality Date   ANTERIOR CERVICAL DECOMP/DISCECTOMY FUSION N/A 10/04/2022   Procedure: CERVICAL THREE-FOUR, CERVICAL FOUR-FIVE, CERVICAL FIVE-SIX, CERVICAL SIX-SEVEN, ANTERIOR CERVICAL DISCECTOMY FUSION, ALLOGRAFT, PLATE;  Surgeon: Eldred Manges, MD;  Location: [facility];  Service: Orthopedics;  Laterality: N/A;   BREAST EXCISIONAL BIOPSY Right    CARPAL TUNNEL RELEASE Left 02/20/2020   Procedure: LEFT CARPAL TUNNEL RELEASE;  Surgeon: Betha Loa, MD;  Location: [facility];  Service: Orthopedics;  Laterality: Left;   CARPAL TUNNEL RELEASE Right 02/28/2021   Procedure: CARPAL TUNNEL RELEASE;  Surgeon: Betha Loa, MD;  Location: [facility];  Service: Orthopedics;  Laterality: Right;   CHOLECYSTECTOMY     GANGLION CYST EXCISION Left 02/20/2020   Procedure: LEFT DORSAL GANGLION EXCISION;  Surgeon: Betha Loa, MD;  Location: [facility];  Service: Orthopedics;  Laterality: Left;   TRIGGER FINGER RELEASE Bilateral 08/29/2021   Procedure: RELEASE TRIGGER FINGER/A-1 PULLEY BILATERAL THUMBS;  Surgeon: Betha Loa, MD;  Location: [facility];  Service: Orthopedics;  Laterality: Bilateral;  30 MIN   TUBAL  LIGATION     Patient Active Problem List   Diagnosis Date Noted   Status post cervical spinal fusion 07/13/2023   Spinal stenosis of lumbar region 07/13/2023   Cervical spinal stenosis 10/04/2022   Spinal stenosis of cervical region 08/30/2022   Foraminal stenosis of cervical region 08/30/2022   Chest pain 03/17/2022   Unstable angina (HCC) 03/15/2022   Paresthesias 06/23/2021   Neck pain 06/23/2021   Alteration consciousness 06/23/2021   Anxiety 06/23/2021   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   Arthralgia 03/30/2021   Hyperglycemia 03/30/2021   Shortness of breath 03/30/2021   Bilateral chronic serous otitis media 08/15/2020   Eustachian tube dysfunction, bilateral 08/15/2020   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   Ganglion cyst of dorsum of left wrist 10/01/2019   Decreased visual acuity 10/01/2019   PTSD (post-traumatic stress disorder) 09/28/2019   Nipple discharge 04/02/2019   Panic attacks 02/19/2019   Bilateral low back pain 02/19/2019   Seasonal allergies 02/19/2019   Anxiety and depression 01/14/2019   Syncope 01/14/2019   Breast mass, right 01/14/2019   Hypertension     PCP: Grayce Sessions, NP   REFERRING PROVIDER: Eldred Manges, MD   REFERRING DIAG:  Diagnosis  M54.42,M54.41,G89.29 (ICD-10-CM) - Chronic bilateral low back pain with bilateral sciatica    Rationale for Evaluation and Treatment: Rehabilitation  THERAPY DIAG:  Other low back pain  Pain in thoracic spine  Cervicalgia  Muscle weakness (generalized)  Difficulty in walking, not elsewhere classified  ONSET DATE: over a year  SUBJECTIVE:                                                                                                                                                                                           SUBJECTIVE STATEMENT: Pt arriving to therapy reporting back pain that has been ongoing. Pt s/p spinal injection which seemed to help with her sleep. Pt  reporting pain in bilateral shoulders and neck. Pt also reporting low back pain that radiates down her leg and previous MRI scan showed moderate stenosis L2-3 mild to moderate at 3 4 facet arthropathy bilateral at L2 bilateral L5-S1 and worse on the right at L4-5.  Pt stating she is unable to sleep in bed. Pt reporting unable to stand entire time when cooking. Pt stating she is unable to stay in car sitting for more than 10-15. 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   PERTINENT HISTORY:  4 level cervical fusion November 2023, recent spinal epidural, anxiety, PTSD, carpal tunnel, unstable angina, depression, arthritis, dyspnea, HA, muscle spasm  PAIN:  NPRS scale: 8-9/10 Pain location: low back and neck Pain description: achy, throbbing Aggravating factors: sitting  Relieving factors: nothing seems to help  PRECAUTIONS: None  WEIGHT BEARING RESTRICTIONS: No  FALLS:  Has patient fallen in last 6 months? No  LIVING ENVIRONMENT: Lives with: lives with their family and lives with their spouse Lives in: House/apartment Stairs: Yes: External: 5 steps; on left going up Has following equipment at home: None  OCCUPATION: not currently working  PLOF: Independent  PATIENT GOALS: Be able to move better, be able to cook a full meal. Be able to perform ADL's and household chores. Be able to drive more than 10 minutes at a time.   Next MD Visit: 09/13/23   OBJECTIVE:   DIAGNOSTIC FINDINGS:  MRI scan showed moderate stenosis L2-3 mild to moderate at 3 4 facet arthropathy bilateral at L2 bilateral L5-S1 and worse on the right at L4-5.  IMPRESSION: 1. Multifactorial degenerative changes at L2-3 with resultant moderate canal and bilateral L2 foraminal stenosis. 2. Right foraminal to extraforaminal disc protrusions at L3-4 and L4-5, contacting and potentially affecting the exiting right L3 and L4 nerve roots respectively. 3. Moderate to  severe lower lumbar facet arthrosis, most pronounced at L4-5 on the right and L5-S1 on the left. Findings could contribute to lower back pain.  PATIENT SURVEYS:  08/08/23: FOTO eval:  25%  SCREENING FOR RED FLAGS: Bowel or bladder  incontinence: No Cauda equina syndrome: No  COGNITION: Overall cognitive status: WFL normal      SENSATION: WFL   POSTURE:  rounded shoulders and forward head, increased lumbar lordosis  PALPATION: TTP: cervical, throacic and lumbar paraspinals, bil upper traps, bil QL, bil iliosacral joint line  ROM:   LUMBAR AROM 08/08/23  Flexion Finger tips to top of knees  Extension 0, unable  Right lateral flexion 16  Left lateral flexion 15  Right rotation Limited 80%  Left rotation limited 80%   (Blank rows = not tested)  CERVICAL AROM 08/08/23  Flexion 10  Extension 12  Right lateral flexion 14  Left lateral flexion 11  Right rotation 25  Left rotation       32    LOWER EXTREMITY ROM:       Right 08/08/23 Left 08/08/23  Hip flexion 80 76  Hip extension    Hip abduction    Hip adduction    Hip internal rotation    Hip external rotation    Knee flexion    Knee extension     (Blank rows = not tested)  LOWER EXTREMITY MMT:    MMT Right 08/08/23 Left 08/08/23  Hip flexion 3- 3-  Hip extension    Hip abduction 3 3  Hip adduction 3 3  Hip internal rotation    Hip external rotation    Knee flexion 4 4  Knee extension 4 4   (Blank rows = not tested)  LUMBAR SPECIAL TESTS:  08/08/23 Slump test: Negative bilateral pt reporting more hamstring tightness bilaterally  FUNCTIONAL TESTS:  08/08/23 5 times sit to stand: 90 seconds c UE support  GAIT: Distance walked: clinic distances level surface Assistive device utilized: None Level of assistance: Complete Independence Comments: wide BOS, antalgic gait                                                                                                                                                                                                                   TODAY'S TREATMENT:  DATE:  08/08/23  Therex: HEP instruction/performance c cues for techniques, handout provided.  Trial set performed of each for comprehension and symptom assessment.  See below for exercise list  PATIENT EDUCATION:  Education details: HEP, POC Person educated: Patient Education method: Explanation, Demonstration, Verbal cues, and Handouts Education comprehension: verbalized understanding, returned demonstration, and verbal cues required  HOME EXERCISE PROGRAM: Access Code: [email or web address] URL: [email or web address] Date: 08/08/2023 Prepared by: Narda Amber  Exercises - Seated Hamstring Stretch  - 2 x daily - 7 x weekly - 3 reps - 20-30 seconds hold - Sit to Stand with Counter Support  - 2 x daily - 7 x weekly - 2-3 sets - 5 reps - Standing Lumbar Extension at Wall - Forearms  - 1 x daily - 7 x weekly - 3 sets - 10 reps - Gentle Levator Scapulae Stretch  - 2 x daily - 7 x weekly - 3 reps - 10 seconds hold  ASSESSMENT:  CLINICAL IMPRESSION: Patient is a 47 y.o. who comes to clinic with complaints of cervical, thoracic, and lumbar pain with mobility, strength and movement coordination deficits that impair their ability to perform usual daily and recreational functional activities without increase difficulty/symptoms at this time. Pt up and down during session due to inability to sit for more than 10 minutes. Pt appearing anxious due to increased pain. Pt able to demonstrate her initial HEP and encouraged to perform self STM using tennis ball to at home.  Patient to benefit from skilled PT services to address impairments and limitations to improve to previous level of function without restriction secondary to condition.   OBJECTIVE IMPAIRMENTS: decreased mobility, difficulty walking,  decreased ROM, decreased strength, and pain.   ACTIVITY LIMITATIONS: lifting, bending, sitting, standing, squatting, and sleeping  PARTICIPATION LIMITATIONS: cleaning, laundry, driving, and community activity, occupation, sleeping, transfers, cooking, and ADL's  PERSONAL FACTORS: 3+ comorbidities: see pertinent history  are also affecting patient's functional outcome.   REHAB POTENTIAL: Fair due to multiple areas to address  CLINICAL DECISION MAKING: Evolving/moderate complexity  EVALUATION COMPLEXITY: Moderate   GOALS: Goals reviewed with patient? Yes  SHORT TERM GOALS: (target date for Short term goals are 3 weeks 08/31/23)  1. Patient will demonstrate independent use of home exercise program to maintain progress from in clinic treatments.  Goal status: New  LONG TERM GOALS: (target dates for all long term goals are 10 weeks  10/19/23 )   1. Patient will demonstrate/report pain at worst less than or equal to 2/10 to facilitate minimal limitation in daily activity secondary to pain symptoms.  Goal status: New   2. Patient will demonstrate independent use of home exercise program to facilitate ability to maintain/progress functional gains from skilled physical therapy services.  Goal status: New   3. Patient will demonstrate FOTO outcome > or = 46 % to indicate reduced disability due to condition.  Goal status: New   4. Patient will improve her 5 time sit to stand to </= 30 seconds with/without UE support.   Goal status: New   5.  Pt  will be able to navigate 5 steps with single hand rail with step over step gait pattern.  Goal status: New   6.  Pt will improve her bilateral cervical rotation to >/= 50 degrees to improved functional mobility.  Goal status: New  7. Pt will be able to tolerate sitting for up to 20 minutes with pain </= 3/10 for improved driving tolerance.  Goal Status: New  PLAN:  PT FREQUENCY: 1-2x/week  PT DURATION: 10 weeks  PLANNED  INTERVENTIONS: Therapeutic exercises, Therapeutic activity, Neuro Muscular re-education, Balance training, Gait training, Patient/Family education, Joint mobilization, Stair training, DME instructions, Dry Needling, Electrical stimulation, Cryotherapy, vasopneumatic device, Moist heat, Taping, Traction Ultrasound, Ionotophoresis 4mg /ml Dexamethasone, and aquatic therapy, Manual therapy.  All included unless contraindicated  PLAN FOR NEXT SESSION: Review HEP knowledge/results, Nustep, stretching, core strengthening     Sharmon Leyden, PT, MPT 08/08/2023, 12:08 PM

## 2023-08-16 ENCOUNTER — Encounter: Payer: Self-pay | Admitting: Rehabilitative and Restorative Service Providers"

## 2023-08-16 ENCOUNTER — Ambulatory Visit: Payer: BC Managed Care – PPO | Admitting: Rehabilitative and Restorative Service Providers"

## 2023-08-16 DIAGNOSIS — M5459 Other low back pain: Secondary | ICD-10-CM

## 2023-08-16 DIAGNOSIS — M6281 Muscle weakness (generalized): Secondary | ICD-10-CM

## 2023-08-16 DIAGNOSIS — M542 Cervicalgia: Secondary | ICD-10-CM

## 2023-08-16 DIAGNOSIS — R262 Difficulty in walking, not elsewhere classified: Secondary | ICD-10-CM

## 2023-08-16 DIAGNOSIS — M546 Pain in thoracic spine: Secondary | ICD-10-CM

## 2023-08-16 NOTE — Therapy (Signed)
OUTPATIENT PHYSICAL THERAPY  THORACOLUMBAR, CERVICAL TREATMENT   Patient Name: Morgan Molina MRN: [id number] DOB:Jan 11, 1976, 47 y.o., female Today's Date: 08/16/2023  END OF SESSION:  PT End of Session - 08/16/23 0757     Visit Number 2    Number of Visits 20    Date for PT Re-Evaluation 10/19/23    PT Start Time 0757    PT Stop Time 0843    PT Time Calculation (min) 46 min    Activity Tolerance Patient limited by pain;Patient tolerated treatment well;No increased pain    Behavior During Therapy WFL for tasks assessed/performed              Past Medical History:  Diagnosis Date   Anemia    Anxiety    Arthritis    Depression    Dyspnea    Headache    Hypertension    Muscle spasm    PTSD (post-traumatic stress disorder) 09/28/2019   Syncope    Past Surgical History:  Procedure Laterality Date   ANTERIOR CERVICAL DECOMP/DISCECTOMY FUSION N/A 10/04/2022   Procedure: CERVICAL THREE-FOUR, CERVICAL FOUR-FIVE, CERVICAL FIVE-SIX, CERVICAL SIX-SEVEN, ANTERIOR CERVICAL DISCECTOMY FUSION, ALLOGRAFT, PLATE;  Surgeon: Eldred Manges, MD;  Location: [facility];  Service: Orthopedics;  Laterality: N/A;   BREAST EXCISIONAL BIOPSY Right    CARPAL TUNNEL RELEASE Left 02/20/2020   Procedure: LEFT CARPAL TUNNEL RELEASE;  Surgeon: Betha Loa, MD;  Location: [facility];  Service: Orthopedics;  Laterality: Left;   CARPAL TUNNEL RELEASE Right 02/28/2021   Procedure: CARPAL TUNNEL RELEASE;  Surgeon: Betha Loa, MD;  Location: [facility];  Service: Orthopedics;  Laterality: Right;   CHOLECYSTECTOMY     GANGLION CYST EXCISION Left 02/20/2020   Procedure: LEFT DORSAL GANGLION EXCISION;  Surgeon: Betha Loa, MD;  Location: [facility];  Service: Orthopedics;  Laterality: Left;   TRIGGER FINGER RELEASE Bilateral 08/29/2021   Procedure: RELEASE TRIGGER FINGER/A-1 PULLEY BILATERAL THUMBS;  Surgeon: Betha Loa, MD;  Location: [facility];  Service: Orthopedics;  Laterality: Bilateral;  30 MIN   TUBAL LIGATION     Patient Active Problem List   Diagnosis Date Noted   Status post cervical spinal fusion 07/13/2023   Spinal stenosis of lumbar region 07/13/2023   Cervical spinal stenosis 10/04/2022   Spinal stenosis of cervical region 08/30/2022   Foraminal stenosis of cervical region 08/30/2022   Chest pain 03/17/2022   Unstable angina (HCC) 03/15/2022   Paresthesias 06/23/2021   Neck pain 06/23/2021   Alteration consciousness 06/23/2021   Anxiety 06/23/2021   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   Arthralgia 03/30/2021   Hyperglycemia 03/30/2021   Shortness of breath 03/30/2021   Bilateral chronic serous otitis media 08/15/2020   Eustachian tube dysfunction, bilateral 08/15/2020   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   Ganglion cyst of dorsum of left wrist 10/01/2019   Decreased visual acuity 10/01/2019   PTSD (post-traumatic stress disorder) 09/28/2019   Nipple discharge 04/02/2019   Panic attacks 02/19/2019   Bilateral low back pain 02/19/2019   Seasonal allergies 02/19/2019   Anxiety and depression 01/14/2019   Syncope 01/14/2019   Breast mass, right 01/14/2019   Hypertension     PCP: Grayce Sessions, NP   REFERRING PROVIDER: Eldred Manges, MD   REFERRING DIAG:  Diagnosis  M54.42,M54.41,G89.29 (ICD-10-CM) - Chronic bilateral low back pain with bilateral sciatica    Rationale for Evaluation and Treatment: Rehabilitation  THERAPY DIAG:  Other low back  pain  Pain in thoracic spine  Cervicalgia  Muscle weakness (generalized)  Difficulty in walking, not elsewhere classified  ONSET DATE: over a year  SUBJECTIVE:                                                                                                                                                                                           SUBJECTIVE STATEMENT: [name] reports good early compliance with her HEP.  She is  limited in endurance to any one posture and would like to be able to walk more for function and exercise.   Pt arriving to therapy reporting back pain that has been ongoing. Pt s/p spinal injection which seemed to help with her sleep. Pt reporting pain in bilateral shoulders and neck. Pt also reporting low back pain that radiates down her leg and previous MRI scan showed moderate stenosis L2-3 mild to moderate at 3 4 facet arthropathy bilateral at L2 bilateral L5-S1 and worse on the right at L4-5.  Pt stating she is unable to sleep in bed. Pt reporting unable to stand entire time when cooking. Pt stating she is unable to stay in car sitting for more than 10-15. 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   PERTINENT HISTORY:  4 level cervical fusion November 2023, recent spinal epidural, anxiety, PTSD, carpal tunnel, unstable angina, depression, arthritis, dyspnea, HA, muscle spasm  PAIN:  NPRS scale: 8-9/10 since before starting PT Pain location: low back and neck Pain description: achy, throbbing Aggravating factors: sitting  Relieving factors: nothing seems to help  PRECAUTIONS: None  WEIGHT BEARING RESTRICTIONS: No  FALLS:  Has patient fallen in last 6 months? No  LIVING ENVIRONMENT: Lives with: lives with their family and lives with their spouse Lives in: House/apartment Stairs: Yes: External: 5 steps; on left going up Has following equipment at home: None  OCCUPATION: not currently working  PLOF: Independent  PATIENT GOALS: Be able to move better, be able to cook a full meal. Be able to perform ADL's and household chores. Be able to drive more than 10 minutes at a time.   Next MD Visit: 09/13/23   OBJECTIVE:   DIAGNOSTIC FINDINGS:  MRI scan showed moderate stenosis L2-3 mild to moderate at 3 4 facet arthropathy bilateral at L2 bilateral L5-S1 and worse on the right at L4-5.  IMPRESSION: 1. Multifactorial degenerative  changes at L2-3 with resultant moderate canal and bilateral L2 foraminal stenosis. 2. Right foraminal to extraforaminal disc protrusions at L3-4 and L4-5, contacting and potentially affecting the exiting right L3 and L4 nerve  roots respectively. 3. Moderate to severe lower lumbar facet arthrosis, most pronounced at L4-5 on the right and L5-S1 on the left. Findings could contribute to lower back pain.  PATIENT SURVEYS:  08/08/23: FOTO eval:  25%  SCREENING FOR RED FLAGS: Bowel or bladder incontinence: No Cauda equina syndrome: No  COGNITION: Overall cognitive status: WFL normal      SENSATION: WFL   POSTURE:  rounded shoulders and forward head, increased lumbar lordosis  PALPATION: TTP: cervical, throacic and lumbar paraspinals, bil upper traps, bil QL, bil iliosacral joint line  ROM:   LUMBAR AROM 08/08/23  Flexion Finger tips to top of knees  Extension 0, unable  Right lateral flexion 16  Left lateral flexion 15  Right rotation Limited 80%  Left rotation limited 80%   (Blank rows = not tested)  CERVICAL AROM 08/08/23  Flexion 10  Extension 12  Right lateral flexion 14  Left lateral flexion 11  Right rotation 25  Left rotation       32    LOWER EXTREMITY ROM:       Right 08/08/23 Left 08/08/23  Hip flexion 80 76  Hip extension    Hip abduction    Hip adduction    Hip internal rotation    Hip external rotation    Knee flexion    Knee extension     (Blank rows = not tested)  LOWER EXTREMITY MMT:    MMT Right 08/08/23 Left 08/08/23  Hip flexion 3- 3-  Hip extension    Hip abduction 3 3  Hip adduction 3 3  Hip internal rotation    Hip external rotation    Knee flexion 4 4  Knee extension 4 4  Cervical extension strength 15.6 pounds with hand-held dynamometer    (Blank rows = not tested)  LUMBAR SPECIAL TESTS:  08/08/23 Slump test: Negative bilateral pt reporting more hamstring tightness bilaterally  FUNCTIONAL TESTS:  08/08/23 5 times sit to  stand: 90 seconds c UE support  GAIT: Distance walked: clinic distances level surface Assistive device utilized: None Level of assistance: Complete Independence Comments: wide BOS, antalgic gait                                                                                                                                                                                                                  TODAY'S TREATMENT:  DATE:  08/16/2023 Lumbar extension AROM (forearms on wall) 10 x 3 seconds Shoulder blade pinches (SBP) 5 x 5 seconds Seated hamstrings stretch, back straight, slight bend in knee 3 x 20 seconds bilateral, foot on a 6 inch step Gentle levator scapulae stretch 3 x 10 seconds bilateral Cervical extension isometrics 10 x 5 seconds Hip hike at counter top 10 x 3 seconds (avoid lateral lean, good posture)  Functional Activities: Sit to stand (with SBP at top) 5 x slow eccentrics Walking prescription (start with walking to comfort daily, progress to 20+ minutes 3 days a week) Basic posture and body mechanics including frequent change of position and avoid bending and twisting   08/08/23  Therex: HEP instruction/performance c cues for techniques, handout provided.  Trial set performed of each for comprehension and symptom assessment.  See below for exercise list  PATIENT EDUCATION:  Education details: HEP, POC Person educated: Patient Education method: Explanation, Demonstration, Verbal cues, and Handouts Education comprehension: verbalized understanding, returned demonstration, and verbal cues required  HOME EXERCISE PROGRAM: Access Code: [email or web address] URL: [email or web address] Date: 08/08/2023 Prepared by: Narda Amber  Exercises - Seated Hamstring Stretch  - 2 x daily - 7 x weekly - 3 reps - 20-30 seconds hold - Sit to Stand with Counter Support  - 2  x daily - 7 x weekly - 2-3 sets - 5 reps - Standing Lumbar Extension at Wall - Forearms  - 1 x daily - 7 x weekly - 3 sets - 10 reps - Gentle Levator Scapulae Stretch  - 2 x daily - 7 x weekly - 3 reps - 10 seconds hold  ASSESSMENT:  CLINICAL IMPRESSION: Catha demonstrated good knowledge of her day 1 HEP.  We spent some time on spine education, reviewed her day 1 HEP and made some progressions to cervical, low back and postural strength to help with her sitting, standing and walking endurance/comfort.  Continue supervised PT to address all impairments noted.  OBJECTIVE IMPAIRMENTS: decreased mobility, difficulty walking, decreased ROM, decreased strength, and pain.   ACTIVITY LIMITATIONS: lifting, bending, sitting, standing, squatting, and sleeping  PARTICIPATION LIMITATIONS: cleaning, laundry, driving, and community activity, occupation, sleeping, transfers, cooking, and ADL's  PERSONAL FACTORS: 3+ comorbidities: see pertinent history  are also affecting patient's functional outcome.   REHAB POTENTIAL: Fair due to multiple areas to address  CLINICAL DECISION MAKING: Evolving/moderate complexity  EVALUATION COMPLEXITY: Moderate   GOALS: Goals reviewed with patient? Yes  SHORT TERM GOALS: (target date for Short term goals are 3 weeks 08/31/23)  1. Patient will demonstrate independent use of home exercise program to maintain progress from in clinic treatments.  Goal status: On Going 08/16/2023  LONG TERM GOALS: (target dates for all long term goals are 10 weeks  10/19/23 )   1. Patient will demonstrate/report pain at worst less than or equal to 2/10 to facilitate minimal limitation in daily activity secondary to pain symptoms.  Goal status: New   2. Patient will demonstrate independent use of home exercise program to facilitate ability to maintain/progress functional gains from skilled physical therapy services.  Goal status: New   3. Patient will demonstrate FOTO outcome  > or = 46 % to indicate reduced disability due to condition.  Goal status: New   4. Patient will improve her 5 time sit to stand to </= 30 seconds with/without UE support.   Goal status: New   5.  Pt  will be able to navigate 5 steps with single hand rail with step over  step gait pattern.  Goal status: New   6.  Pt will improve her bilateral cervical rotation to >/= 50 degrees to improved functional mobility.  Goal status: New  7. Pt will be able to tolerate sitting for up to 20 minutes with pain </= 3/10 for improved driving tolerance.  Goal Status: New     PLAN:  PT FREQUENCY: 1-2x/week  PT DURATION: 10 weeks  PLANNED INTERVENTIONS: Therapeutic exercises, Therapeutic activity, Neuro Muscular re-education, Balance training, Gait training, Patient/Family education, Joint mobilization, Stair training, DME instructions, Dry Needling, Electrical stimulation, Cryotherapy, vasopneumatic device, Moist heat, Taping, Traction Ultrasound, Ionotophoresis 4mg /ml Dexamethasone, and aquatic therapy, Manual therapy.  All included unless contraindicated  PLAN FOR NEXT SESSION: Review HEP knowledge/results, Nustep, stretching, cervical, low back and postural strengthening.     Cherlyn Cushing, PT, MPT 08/16/2023, 1:09 PM

## 2023-08-21 ENCOUNTER — Encounter: Payer: Self-pay | Admitting: Physical Therapy

## 2023-08-21 ENCOUNTER — Other Ambulatory Visit (INDEPENDENT_AMBULATORY_CARE_PROVIDER_SITE_OTHER): Payer: Self-pay | Admitting: Primary Care

## 2023-08-21 ENCOUNTER — Ambulatory Visit (INDEPENDENT_AMBULATORY_CARE_PROVIDER_SITE_OTHER): Payer: BC Managed Care – PPO | Admitting: Physical Therapy

## 2023-08-21 DIAGNOSIS — M546 Pain in thoracic spine: Secondary | ICD-10-CM | POA: Diagnosis not present

## 2023-08-21 DIAGNOSIS — M6281 Muscle weakness (generalized): Secondary | ICD-10-CM | POA: Diagnosis not present

## 2023-08-21 DIAGNOSIS — M5459 Other low back pain: Secondary | ICD-10-CM | POA: Diagnosis not present

## 2023-08-21 DIAGNOSIS — F4310 Post-traumatic stress disorder, unspecified: Secondary | ICD-10-CM

## 2023-08-21 DIAGNOSIS — M542 Cervicalgia: Secondary | ICD-10-CM | POA: Diagnosis not present

## 2023-08-21 DIAGNOSIS — R262 Difficulty in walking, not elsewhere classified: Secondary | ICD-10-CM

## 2023-08-21 DIAGNOSIS — Z760 Encounter for issue of repeat prescription: Secondary | ICD-10-CM

## 2023-08-21 NOTE — Therapy (Signed)
OUTPATIENT PHYSICAL THERAPY  THORACOLUMBAR, CERVICAL TREATMENT   Patient Name: Morgan Molina MRN: [id number] DOB:17-Nov-1975, 47 y.o., female Today's Date: 08/21/2023  END OF SESSION:  PT End of Session - 08/21/23 0855     Visit Number 3    Number of Visits 20    Date for PT Re-Evaluation 10/19/23    PT Start Time 0803    PT Stop Time 0848    PT Time Calculation (min) 45 min    Activity Tolerance Patient limited by pain;Patient tolerated treatment well;No increased pain    Behavior During Therapy WFL for tasks assessed/performed               Past Medical History:  Diagnosis Date   Anemia    Anxiety    Arthritis    Depression    Dyspnea    Headache    Hypertension    Muscle spasm    PTSD (post-traumatic stress disorder) 09/28/2019   Syncope    Past Surgical History:  Procedure Laterality Date   ANTERIOR CERVICAL DECOMP/DISCECTOMY FUSION N/A 10/04/2022   Procedure: CERVICAL THREE-FOUR, CERVICAL FOUR-FIVE, CERVICAL FIVE-SIX, CERVICAL SIX-SEVEN, ANTERIOR CERVICAL DISCECTOMY FUSION, ALLOGRAFT, PLATE;  Surgeon: Eldred Manges, MD;  Location: [facility];  Service: Orthopedics;  Laterality: N/A;   BREAST EXCISIONAL BIOPSY Right    CARPAL TUNNEL RELEASE Left 02/20/2020   Procedure: LEFT CARPAL TUNNEL RELEASE;  Surgeon: Betha Loa, MD;  Location: [facility];  Service: Orthopedics;  Laterality: Left;   CARPAL TUNNEL RELEASE Right 02/28/2021   Procedure: CARPAL TUNNEL RELEASE;  Surgeon: Betha Loa, MD;  Location: [facility];  Service: Orthopedics;  Laterality: Right;   CHOLECYSTECTOMY     GANGLION CYST EXCISION Left 02/20/2020   Procedure: LEFT DORSAL GANGLION EXCISION;  Surgeon: Betha Loa, MD;  Location: [facility];  Service: Orthopedics;  Laterality: Left;   TRIGGER FINGER RELEASE Bilateral 08/29/2021   Procedure: RELEASE TRIGGER FINGER/A-1 PULLEY BILATERAL THUMBS;  Surgeon: Betha Loa, MD;  Location: [facility];  Service: Orthopedics;  Laterality: Bilateral;  30 MIN   TUBAL LIGATION     Patient Active Problem List   Diagnosis Date Noted   Status post cervical spinal fusion 07/13/2023   Spinal stenosis of lumbar region 07/13/2023   Cervical spinal stenosis 10/04/2022   Spinal stenosis of cervical region 08/30/2022   Foraminal stenosis of cervical region 08/30/2022   Chest pain 03/17/2022   Unstable angina (HCC) 03/15/2022   Paresthesias 06/23/2021   Neck pain 06/23/2021   Alteration consciousness 06/23/2021   Anxiety 06/23/2021   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   Arthralgia 03/30/2021   Hyperglycemia 03/30/2021   Shortness of breath 03/30/2021   Bilateral chronic serous otitis media 08/15/2020   Eustachian tube dysfunction, bilateral 08/15/2020   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   Ganglion cyst of dorsum of left wrist 10/01/2019   Decreased visual acuity 10/01/2019   PTSD (post-traumatic stress disorder) 09/28/2019   Nipple discharge 04/02/2019   Panic attacks 02/19/2019   Bilateral low back pain 02/19/2019   Seasonal allergies 02/19/2019   Anxiety and depression 01/14/2019   Syncope 01/14/2019   Breast mass, right 01/14/2019   Hypertension     PCP: Grayce Sessions, NP   REFERRING PROVIDER: Eldred Manges, MD   REFERRING DIAG:  Diagnosis  M54.42,M54.41,G89.29 (ICD-10-CM) - Chronic bilateral low back pain with bilateral sciatica    Rationale for Evaluation and Treatment: Rehabilitation  THERAPY DIAG:  Other low  back pain  Pain in thoracic spine  Cervicalgia  Muscle weakness (generalized)  Difficulty in walking, not elsewhere classified  ONSET DATE: over a year  SUBJECTIVE:                                                                                                                                                                                           SUBJECTIVE STATEMENT: Pt arriving today reporting her right side is giving her a fit  today.   Eval:  Pt arriving to therapy reporting back pain that has been ongoing. Pt s/p spinal injection which seemed to help with her sleep. Pt reporting pain in bilateral shoulders and neck. Pt also reporting low back pain that radiates down her leg and previous MRI scan showed moderate stenosis L2-3 mild to moderate at 3 4 facet arthropathy bilateral at L2 bilateral L5-S1 and worse on the right at L4-5.  Pt stating she is unable to sleep in bed. Pt reporting unable to stand entire time when cooking. Pt stating she is unable to stay in car sitting for more than 10-15. 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   PERTINENT HISTORY:  4 level cervical fusion November 2023, recent spinal epidural, anxiety, PTSD, carpal tunnel, unstable angina, depression, arthritis, dyspnea, HA, muscle spasm  PAIN:  NPRS scale: 8/10 since before starting PT Pain location: low back and neck Pain description: achy, throbbing Aggravating factors: sitting  Relieving factors: nothing seems to help  PRECAUTIONS: None  WEIGHT BEARING RESTRICTIONS: No  FALLS:  Has patient fallen in last 6 months? No  LIVING ENVIRONMENT: Lives with: lives with their family and lives with their spouse Lives in: House/apartment Stairs: Yes: External: 5 steps; on left going up Has following equipment at home: None  OCCUPATION: not currently working  PLOF: Independent  PATIENT GOALS: Be able to move better, be able to cook a full meal. Be able to perform ADL's and household chores. Be able to drive more than 10 minutes at a time.   Next MD Visit: 09/13/23   OBJECTIVE:   DIAGNOSTIC FINDINGS:  MRI scan showed moderate stenosis L2-3 mild to moderate at 3 4 facet arthropathy bilateral at L2 bilateral L5-S1 and worse on the right at L4-5.  IMPRESSION: 1. Multifactorial degenerative changes at L2-3 with resultant moderate canal and bilateral L2 foraminal stenosis. 2. Right  foraminal to extraforaminal disc protrusions at L3-4 and L4-5, contacting and potentially affecting the exiting right L3 and L4 nerve roots respectively. 3. Moderate to severe lower lumbar facet arthrosis, most pronounced at L4-5 on  the right and L5-S1 on the left. Findings could contribute to lower back pain.  PATIENT SURVEYS:  08/08/23: FOTO eval:  25%  SCREENING FOR RED FLAGS: Bowel or bladder incontinence: No Cauda equina syndrome: No  COGNITION: Overall cognitive status: WFL normal      SENSATION: WFL   POSTURE:  rounded shoulders and forward head, increased lumbar lordosis  PALPATION: TTP: cervical, throacic and lumbar paraspinals, bil upper traps, bil QL, bil iliosacral joint line  ROM:   LUMBAR AROM 08/08/23  Flexion Finger tips to top of knees  Extension 0, unable  Right lateral flexion 16  Left lateral flexion 15  Right rotation Limited 80%  Left rotation limited 80%   (Blank rows = not tested)  CERVICAL AROM 08/08/23  Flexion 10  Extension 12  Right lateral flexion 14  Left lateral flexion 11  Right rotation 25  Left rotation       32    LOWER EXTREMITY ROM:       Right 08/08/23 Left 08/08/23  Hip flexion 80 76  Hip extension    Hip abduction    Hip adduction    Hip internal rotation    Hip external rotation    Knee flexion    Knee extension     (Blank rows = not tested)  LOWER EXTREMITY MMT:    MMT Right 08/08/23 Left 08/08/23  Hip flexion 3- 3-  Hip extension    Hip abduction 3 3  Hip adduction 3 3  Hip internal rotation    Hip external rotation    Knee flexion 4 4  Knee extension 4 4  Cervical extension strength 15.6 pounds with hand-held dynamometer    (Blank rows = not tested)  LUMBAR SPECIAL TESTS:  08/08/23 Slump test: Negative bilateral pt reporting more hamstring tightness bilaterally  FUNCTIONAL TESTS:  08/08/23 5 times sit to stand: 90 seconds c UE support  GAIT: Distance walked: clinic distances level  surface Assistive device utilized: None Level of assistance: Complete Independence Comments: wide BOS, antalgic gait                                                                                                                                                                                                                  TODAY'S TREATMENT:  DATE:  08/21/23:  TherEx:  Rows: green TB x 15 holding 3 sec Chest press: red TB x 15  Supine cervical retraction while lying on towel roll with open book stretch x 10 holding 5 sec  Trunk rotation x 4 holding 60 sec Bridges: x 10 holding 5 sec Upper trap stretch: x 3 holding 10 sec bil Levator scapulae stretch x 3 holding 10 sec  Moist heat: x 5 minutes to cervical spine   08/16/2023 Lumbar extension AROM (forearms on wall) 10 x 3 seconds Shoulder blade pinches (SBP) 5 x 5 seconds Seated hamstrings stretch, back straight, slight bend in knee 3 x 20 seconds bilateral, foot on a 6 inch step Gentle levator scapulae stretch 3 x 10 seconds bilateral Cervical extension isometrics 10 x 5 seconds Hip hike at counter top 10 x 3 seconds (avoid lateral lean, good posture)  Functional Activities: Sit to stand (with SBP at top) 5 x slow eccentrics Walking prescription (start with walking to comfort daily, progress to 20+ minutes 3 days a week) Basic posture and body mechanics including frequent change of position and avoid bending and twisting   08/08/23  Therex: HEP instruction/performance c cues for techniques, handout provided.  Trial set performed of each for comprehension and symptom assessment.  See below for exercise list  PATIENT EDUCATION:  Education details: HEP, POC Person educated: Patient Education method: Explanation, Demonstration, Verbal cues, and Handouts Education comprehension: verbalized understanding, returned demonstration,  and verbal cues required  HOME EXERCISE PROGRAM: Access Code: [email or web address] URL: [email or web address] Date: 08/08/2023 Prepared by: Narda Amber  Exercises - Seated Hamstring Stretch  - 2 x daily - 7 x weekly - 3 reps - 20-30 seconds hold - Sit to Stand with Counter Support  - 2 x daily - 7 x weekly - 2-3 sets - 5 reps - Standing Lumbar Extension at Wall - Forearms  - 1 x daily - 7 x weekly - 3 sets - 10 reps - Gentle Levator Scapulae Stretch  - 2 x daily - 7 x weekly - 3 reps - 10 seconds hold  ASSESSMENT:  CLINICAL IMPRESSION: Pt arriving today reporting 8/10 pain in her neck more on the right side. Pt tolerating exercises with adjustments made to decrease pt's pain. Pt encouraged to work on posture control during ADL's and work related activities. Recommend continued skilled PT.   OBJECTIVE IMPAIRMENTS: decreased mobility, difficulty walking, decreased ROM, decreased strength, and pain.   ACTIVITY LIMITATIONS: lifting, bending, sitting, standing, squatting, and sleeping  PARTICIPATION LIMITATIONS: cleaning, laundry, driving, and community activity, occupation, sleeping, transfers, cooking, and ADL's  PERSONAL FACTORS: 3+ comorbidities: see pertinent history  are also affecting patient's functional outcome.   REHAB POTENTIAL: Fair due to multiple areas to address  CLINICAL DECISION MAKING: Evolving/moderate complexity  EVALUATION COMPLEXITY: Moderate   GOALS: Goals reviewed with patient? Yes  SHORT TERM GOALS: (target date for Short term goals are 3 weeks 08/31/23)  1. Patient will demonstrate independent use of home exercise program to maintain progress from in clinic treatments.  Goal status: on-going 08/21/23  LONG TERM GOALS: (target dates for all long term goals are 10 weeks  10/19/23 )   1. Patient will demonstrate/report pain at worst less than or equal to 2/10 to facilitate minimal limitation in daily activity secondary to pain symptoms.  Goal  status: New   2. Patient will demonstrate independent use of home exercise program to facilitate ability to maintain/progress functional gains from skilled physical therapy services.  Goal status: New  3. Patient will demonstrate FOTO outcome > or = 46 % to indicate reduced disability due to condition.  Goal status: New   4. Patient will improve her 5 time sit to stand to </= 30 seconds with/without UE support.   Goal status: New   5.  Pt  will be able to navigate 5 steps with single hand rail with step over step gait pattern.  Goal status: New   6.  Pt will improve her bilateral cervical rotation to >/= 50 degrees to improved functional mobility.  Goal status: New  7. Pt will be able to tolerate sitting for up to 20 minutes with pain </= 3/10 for improved driving tolerance.  Goal Status: New     PLAN:  PT FREQUENCY: 1-2x/week  PT DURATION: 10 weeks  PLANNED INTERVENTIONS: Therapeutic exercises, Therapeutic activity, Neuro Muscular re-education, Balance training, Gait training, Patient/Family education, Joint mobilization, Stair training, DME instructions, Dry Needling, Electrical stimulation, Cryotherapy, vasopneumatic device, Moist heat, Taping, Traction Ultrasound, Ionotophoresis 4mg /ml Dexamethasone, and aquatic therapy, Manual therapy.  All included unless contraindicated  PLAN FOR NEXT SESSION: Review HEP knowledge/results, Nustep, stretching, cervical, low back and postural strengthening.     Sharmon Leyden, PT, MPT 08/21/2023, 8:56 AM

## 2023-08-23 ENCOUNTER — Encounter: Payer: BC Managed Care – PPO | Admitting: Rehabilitative and Restorative Service Providers"

## 2023-08-28 ENCOUNTER — Encounter: Payer: Self-pay | Admitting: Rehabilitative and Restorative Service Providers"

## 2023-08-28 ENCOUNTER — Ambulatory Visit (INDEPENDENT_AMBULATORY_CARE_PROVIDER_SITE_OTHER): Payer: BC Managed Care – PPO | Admitting: Rehabilitative and Restorative Service Providers"

## 2023-08-28 DIAGNOSIS — M546 Pain in thoracic spine: Secondary | ICD-10-CM

## 2023-08-28 DIAGNOSIS — R262 Difficulty in walking, not elsewhere classified: Secondary | ICD-10-CM

## 2023-08-28 DIAGNOSIS — M542 Cervicalgia: Secondary | ICD-10-CM | POA: Diagnosis not present

## 2023-08-28 DIAGNOSIS — M6281 Muscle weakness (generalized): Secondary | ICD-10-CM

## 2023-08-28 DIAGNOSIS — M5459 Other low back pain: Secondary | ICD-10-CM

## 2023-08-28 NOTE — Therapy (Signed)
OUTPATIENT PHYSICAL THERAPY  TREATMENT   Patient Name: Morgan Molina MRN: [id number] DOB:1976/01/05, 47 y.o., female Today's Date: 08/28/2023  END OF SESSION:  PT End of Session - 08/28/23 0807     Visit Number 4    Number of Visits 20    Date for PT Re-Evaluation 10/19/23    PT Start Time 0800    PT Stop Time 0840    PT Time Calculation (min) 40 min    Activity Tolerance Patient limited by pain    Behavior During Therapy [facility] for tasks assessed/performed                Past Medical History:  Diagnosis Date   Anemia    Anxiety    Arthritis    Depression    Dyspnea    Headache    Hypertension    Muscle spasm    PTSD (post-traumatic stress disorder) 09/28/2019   Syncope    Past Surgical History:  Procedure Laterality Date   ANTERIOR CERVICAL DECOMP/DISCECTOMY FUSION N/A 10/04/2022   Procedure: CERVICAL THREE-FOUR, CERVICAL FOUR-FIVE, CERVICAL FIVE-SIX, CERVICAL SIX-SEVEN, ANTERIOR CERVICAL DISCECTOMY FUSION, ALLOGRAFT, PLATE;  Surgeon: Eldred Manges, MD;  Location: [facility] OR;  Service: Orthopedics;  Laterality: N/A;   BREAST EXCISIONAL BIOPSY Right    CARPAL TUNNEL RELEASE Left 02/20/2020   Procedure: LEFT CARPAL TUNNEL RELEASE;  Surgeon: Betha Loa, MD;  Location: [facility];  Service: Orthopedics;  Laterality: Left;   CARPAL TUNNEL RELEASE Right 02/28/2021   Procedure: CARPAL TUNNEL RELEASE;  Surgeon: Betha Loa, MD;  Location: [facility];  Service: Orthopedics;  Laterality: Right;   CHOLECYSTECTOMY     GANGLION CYST EXCISION Left 02/20/2020   Procedure: LEFT DORSAL GANGLION EXCISION;  Surgeon: Betha Loa, MD;  Location: [facility];  Service: Orthopedics;  Laterality: Left;   TRIGGER FINGER RELEASE Bilateral 08/29/2021   Procedure: RELEASE TRIGGER FINGER/A-1 PULLEY BILATERAL THUMBS;  Surgeon: Betha Loa, MD;  Location: [facility];  Service: Orthopedics;  Laterality: Bilateral;  30 MIN   TUBAL  LIGATION     Patient Active Problem List   Diagnosis Date Noted   Status post cervical spinal fusion 07/13/2023   Spinal stenosis of lumbar region 07/13/2023   Cervical spinal stenosis 10/04/2022   Spinal stenosis of cervical region 08/30/2022   Foraminal stenosis of cervical region 08/30/2022   Chest pain 03/17/2022   Unstable angina (HCC) 03/15/2022   Paresthesias 06/23/2021   Neck pain 06/23/2021   Alteration consciousness 06/23/2021   Anxiety 06/23/2021   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   Arthralgia 03/30/2021   Hyperglycemia 03/30/2021   Shortness of breath 03/30/2021   Bilateral chronic serous otitis media 08/15/2020   Eustachian tube dysfunction, bilateral 08/15/2020   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   Ganglion cyst of dorsum of left wrist 10/01/2019   Decreased visual acuity 10/01/2019   PTSD (post-traumatic stress disorder) 09/28/2019   Nipple discharge 04/02/2019   Panic attacks 02/19/2019   Bilateral low back pain 02/19/2019   Seasonal allergies 02/19/2019   Anxiety and depression 01/14/2019   Syncope 01/14/2019   Breast mass, right 01/14/2019   Hypertension     PCP: Grayce Sessions, NP   REFERRING PROVIDER: Eldred Manges, MD   REFERRING DIAG:  Diagnosis  M54.42,M54.41,G89.29 (ICD-10-CM) - Chronic bilateral low back pain with bilateral sciatica    Rationale for Evaluation and Treatment: Rehabilitation  THERAPY DIAG:  Other low back pain  Pain in thoracic  spine  Cervicalgia  Muscle weakness (generalized)  Difficulty in walking, not elsewhere classified  ONSET DATE: over a year  SUBJECTIVE:                                                                                                                                                                                           SUBJECTIVE STATEMENT: Pt indicated chronic pain in Rt side in back and neck that stays around 8/10.  Pt indicated no change in symptoms.    Does feel like  leaning back can help in short term.  Reported some shaking and diffculty putting weight on Rt leg.    PERTINENT HISTORY:  4 level cervical fusion November 2023, recent spinal epidural, anxiety, PTSD, carpal tunnel, unstable angina, depression, arthritis, dyspnea, HA, muscle spasm  PAIN:  NPRS scale:  8/10  Pain location: low back and neck Pain description: achy, throbbing Aggravating factors: "basic activities" prolonged standing, house activity  Relieving factors: nothing seems to help  PRECAUTIONS: None  WEIGHT BEARING RESTRICTIONS: No  FALLS:  Has patient fallen in last 6 months? No  LIVING ENVIRONMENT: Lives with: lives with their family and lives with their spouse Lives in: House/apartment Stairs: Yes: External: 5 steps; on left going up Has following equipment at home: None  OCCUPATION: not currently working  PLOF: Independent  PATIENT GOALS: Be able to move better, be able to cook a full meal. Be able to perform ADL's and household chores. Be able to drive more than 10 minutes at a time.   Next MD Visit: 09/13/23   OBJECTIVE:   DIAGNOSTIC FINDINGS:  MRI scan showed moderate stenosis L2-3 mild to moderate at 3 4 facet arthropathy bilateral at L2 bilateral L5-S1 and worse on the right at L4-5.  IMPRESSION: 1. Multifactorial degenerative changes at L2-3 with resultant moderate canal and bilateral L2 foraminal stenosis. 2. Right foraminal to extraforaminal disc protrusions at L3-4 and L4-5, contacting and potentially affecting the exiting right L3 and L4 nerve roots respectively. 3. Moderate to severe lower lumbar facet arthrosis, most pronounced at L4-5 on the right and L5-S1 on the left. Findings could contribute to lower back pain.  PATIENT SURVEYS:  08/08/23: FOTO eval:  25%  SCREENING FOR RED FLAGS: 08/08/23 Bowel or bladder incontinence: No Cauda equina syndrome: No  COGNITION: 08/08/23 Overall cognitive status: WFL  normal      SENSATION: 08/08/23 WFL  POSTURE:  08/08/23 rounded shoulders and forward head, increased lumbar lordosis  PALPATION: 08/08/23 TTP: cervical, throacic and lumbar paraspinals, bil upper traps, bil QL, bil iliosacral joint line  ROM:   LUMBAR AROM 08/08/23 08/28/2023  Flexion Finger  tips to top of knees   Extension 0, unable 25% WFL, moved to 50 % c repeated x 5 in standing.   End range pain noted  Right lateral flexion 16   Left lateral flexion 15   Right rotation Limited 80%   Left rotation limited 80%    (Blank rows = not tested)  CERVICAL AROM 08/08/23 08/28/2023  Flexion 10 22  Extension 12 25  Right lateral flexion 14   Left lateral flexion 11   Right rotation 25 35  Left rotation       32          45    LOWER EXTREMITY ROM:       Right 08/08/23 Left 08/08/23  Hip flexion 80 76  Hip extension    Hip abduction    Hip adduction    Hip internal rotation    Hip external rotation    Knee flexion    Knee extension     (Blank rows = not tested)  LOWER EXTREMITY MMT:    MMT Right 08/08/23 Left 08/08/23  Hip flexion 3- 3-  Hip extension    Hip abduction 3 3  Hip adduction 3 3  Hip internal rotation    Hip external rotation    Knee flexion 4 4  Knee extension 4 4  Cervical extension strength 15.6 pounds with hand-held dynamometer    (Blank rows = not tested)  LUMBAR SPECIAL TESTS:  08/08/23 Slump test: Negative bilateral pt reporting more hamstring tightness bilaterally  FUNCTIONAL TESTS:  08/08/23 5 times sit to stand: 90 seconds c UE support  GAIT: 08/08/23 Distance walked: clinic distances level surface Assistive device utilized: None Level of assistance: Complete Independence Comments: wide BOS, antalgic gait                                                                                                                                                                                                                  TODAY'S TREATMENT:                                                                         DATE: 08/28/2023:  TherEx:  Nustep Lvl 5 8 mins UE/LE for aerobic exercise and mobility  Standing  lumbar extension AROM x 5 Supine lumbar trunk rotation stretch 15 sec x 3 bilaterally  Supine bridge 2-3 sec hold x 10   Neuro Re-ed Pain neuro science education about elevated central nervous system response (comparison to alarm system being turned up) and how elevation of response can contribute to increased sensitivity to light touch, mobility ,etc that can impact daily life.  Detailed ways to improve (education, general mobility, aerobic exercise, activity tolerance improvements with pain range).   Manual Percussive device to bilateral upper traps, Rt lower lumbar musculature for myofascial relaxation.    TODAY'S TREATMENT:                                                                        DATE: 08/21/23:  TherEx:  Rows: green TB x 15 holding 3 sec Chest press: red TB x 15  Supine cervical retraction while lying on towel roll with open book stretch x 10 holding 5 sec  Trunk rotation x 4 holding 60 sec Bridges: x 10 holding 5 sec Upper trap stretch: x 3 holding 10 sec bil Levator scapulae stretch x 3 holding 10 sec  Moist heat: x 5 minutes to cervical spine   TODAY'S TREATMENT:                                                                        DATE: 08/16/2023 Lumbar extension AROM (forearms on wall) 10 x 3 seconds Shoulder blade pinches (SBP) 5 x 5 seconds Seated hamstrings stretch, back straight, slight bend in knee 3 x 20 seconds bilateral, foot on a 6 inch step Gentle levator scapulae stretch 3 x 10 seconds bilateral Cervical extension isometrics 10 x 5 seconds Hip hike at counter top 10 x 3 seconds (avoid lateral lean, good posture)  Functional Activities: Sit to stand (with SBP at top) 5 x slow eccentrics Walking prescription (start with walking to comfort daily, progress to 20+ minutes 3 days a  week) Basic posture and body mechanics including frequent change of position and avoid bending and twisting    PATIENT EDUCATION:  Education details: HEP, POC Person educated: Patient Education method: Programmer, multimedia, Demonstration, Verbal cues, and Handouts Education comprehension: verbalized understanding, returned demonstration, and verbal cues required  HOME EXERCISE PROGRAM: Access Code: [email or web address] URL: [email or web address] Date: 08/08/2023 Prepared by: Narda Amber  Exercises - Seated Hamstring Stretch  - 2 x daily - 7 x weekly - 3 reps - 20-30 seconds hold - Sit to Stand with Counter Support  - 2 x daily - 7 x weekly - 2-3 sets - 5 reps - Standing Lumbar Extension at Wall - Forearms  - 1 x daily - 7 x weekly - 3 sets - 10 reps - Gentle Levator Scapulae Stretch  - 2 x daily - 7 x weekly - 3 reps - 10 seconds hold  ASSESSMENT:  CLINICAL IMPRESSION: Noted guarding and increased myofascial tightness/sensitivity noted in palpation.  Addressed today with percussive device with fair tolerance in moment but  improved range of motion noted post treatment.  May continue in future based off patient desire.  Discussed central sensitization with patient in regards to chronic pain and nervous system response.  Continued skilled PT services warranted at this time.   OBJECTIVE IMPAIRMENTS: decreased mobility, difficulty walking, decreased ROM, decreased strength, and pain.   ACTIVITY LIMITATIONS: lifting, bending, sitting, standing, squatting, and sleeping  PARTICIPATION LIMITATIONS: cleaning, laundry, driving, and community activity, occupation, sleeping, transfers, cooking, and ADL's  PERSONAL FACTORS: 3+ comorbidities: see pertinent history  are also affecting patient's functional outcome.   REHAB POTENTIAL: Fair due to multiple areas to address  CLINICAL DECISION MAKING: Evolving/moderate complexity  EVALUATION COMPLEXITY: Moderate   GOALS: Goals reviewed with  patient? Yes  SHORT TERM GOALS: (target date for Short term goals are 3 weeks 08/31/23)  1. Patient will demonstrate independent use of home exercise program to maintain progress from in clinic treatments.  Goal status: met 08/28/2023  LONG TERM GOALS: (target dates for all long term goals are 10 weeks  10/19/23 )   1. Patient will demonstrate/report pain at worst less than or equal to 2/10 to facilitate minimal limitation in daily activity secondary to pain symptoms.  Goal status: on going 08/28/2023   2. Patient will demonstrate independent use of home exercise program to facilitate ability to maintain/progress functional gains from skilled physical therapy services.  Goal status:  on going 08/28/2023   3. Patient will demonstrate FOTO outcome > or = 46 % to indicate reduced disability due to condition.  Goal status:  on going 08/28/2023   4. Patient will improve her 5 time sit to stand to </= 30 seconds with/without UE support.   Goal status:  on going 08/28/2023   5.  Pt  will be able to navigate 5 steps with single hand rail with step over step gait pattern.  Goal status:  on going 08/28/2023   6.  Pt will improve her bilateral cervical rotation to >/= 50 degrees to improved functional mobility.  Goal status:  on going 08/28/2023  7. Pt will be able to tolerate sitting for up to 20 minutes with pain </= 3/10 for improved driving tolerance.  Goal Status:  on going 08/28/2023     PLAN:  PT FREQUENCY: 1-2x/week  PT DURATION: 10 weeks  PLANNED INTERVENTIONS: Therapeutic exercises, Therapeutic activity, Neuro Muscular re-education, Balance training, Gait training, Patient/Family education, Joint mobilization, Stair training, DME instructions, Dry Needling, Electrical stimulation, Cryotherapy, vasopneumatic device, Moist heat, Taping, Traction Ultrasound, Ionotophoresis 4mg /ml Dexamethasone, and aquatic therapy, Manual therapy.  All included unless contraindicated  PLAN  FOR NEXT SESSION: Percussive device as desired. General mobility improvements.    Chyrel Masson, PT, DPT, OCS, ATC 08/28/23  8:45 AM

## 2023-08-30 ENCOUNTER — Encounter: Payer: Self-pay | Admitting: Rehabilitative and Restorative Service Providers"

## 2023-08-30 ENCOUNTER — Ambulatory Visit (INDEPENDENT_AMBULATORY_CARE_PROVIDER_SITE_OTHER): Payer: BC Managed Care – PPO | Admitting: Rehabilitative and Restorative Service Providers"

## 2023-08-30 DIAGNOSIS — M542 Cervicalgia: Secondary | ICD-10-CM | POA: Diagnosis not present

## 2023-08-30 DIAGNOSIS — M6281 Muscle weakness (generalized): Secondary | ICD-10-CM

## 2023-08-30 DIAGNOSIS — M5459 Other low back pain: Secondary | ICD-10-CM

## 2023-08-30 DIAGNOSIS — M546 Pain in thoracic spine: Secondary | ICD-10-CM | POA: Diagnosis not present

## 2023-08-30 DIAGNOSIS — R262 Difficulty in walking, not elsewhere classified: Secondary | ICD-10-CM

## 2023-08-30 NOTE — Therapy (Signed)
OUTPATIENT PHYSICAL THERAPY  TREATMENT   Patient Name: Morgan Molina MRN: [id number] DOB:1976-07-09, 47 y.o., female Today's Date: 08/30/2023  END OF SESSION:  PT End of Session - 08/30/23 0824     Visit Number 5    Number of Visits 20    Date for PT Re-Evaluation 10/19/23    PT Start Time 0803    PT Stop Time 0842    PT Time Calculation (min) 39 min    Activity Tolerance Patient tolerated treatment well    Behavior During Therapy [facility] for tasks assessed/performed                 Past Medical History:  Diagnosis Date   Anemia    Anxiety    Arthritis    Depression    Dyspnea    Headache    Hypertension    Muscle spasm    PTSD (post-traumatic stress disorder) 09/28/2019   Syncope    Past Surgical History:  Procedure Laterality Date   ANTERIOR CERVICAL DECOMP/DISCECTOMY FUSION N/A 10/04/2022   Procedure: CERVICAL THREE-FOUR, CERVICAL FOUR-FIVE, CERVICAL FIVE-SIX, CERVICAL SIX-SEVEN, ANTERIOR CERVICAL DISCECTOMY FUSION, ALLOGRAFT, PLATE;  Surgeon: Eldred Manges, MD;  Location: [facility] OR;  Service: Orthopedics;  Laterality: N/A;   BREAST EXCISIONAL BIOPSY Right    CARPAL TUNNEL RELEASE Left 02/20/2020   Procedure: LEFT CARPAL TUNNEL RELEASE;  Surgeon: Betha Loa, MD;  Location: [facility];  Service: Orthopedics;  Laterality: Left;   CARPAL TUNNEL RELEASE Right 02/28/2021   Procedure: CARPAL TUNNEL RELEASE;  Surgeon: Betha Loa, MD;  Location: [facility];  Service: Orthopedics;  Laterality: Right;   CHOLECYSTECTOMY     GANGLION CYST EXCISION Left 02/20/2020   Procedure: LEFT DORSAL GANGLION EXCISION;  Surgeon: Betha Loa, MD;  Location: [facility];  Service: Orthopedics;  Laterality: Left;   TRIGGER FINGER RELEASE Bilateral 08/29/2021   Procedure: RELEASE TRIGGER FINGER/A-1 PULLEY BILATERAL THUMBS;  Surgeon: Betha Loa, MD;  Location: [facility];  Service: Orthopedics;  Laterality: Bilateral;  30 MIN    TUBAL LIGATION     Patient Active Problem List   Diagnosis Date Noted   Status post cervical spinal fusion 07/13/2023   Spinal stenosis of lumbar region 07/13/2023   Cervical spinal stenosis 10/04/2022   Spinal stenosis of cervical region 08/30/2022   Foraminal stenosis of cervical region 08/30/2022   Chest pain 03/17/2022   Unstable angina (HCC) 03/15/2022   Paresthesias 06/23/2021   Neck pain 06/23/2021   Alteration consciousness 06/23/2021   Anxiety 06/23/2021   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   Arthralgia 03/30/2021   Hyperglycemia 03/30/2021   Shortness of breath 03/30/2021   Bilateral chronic serous otitis media 08/15/2020   Eustachian tube dysfunction, bilateral 08/15/2020   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   Ganglion cyst of dorsum of left wrist 10/01/2019   Decreased visual acuity 10/01/2019   PTSD (post-traumatic stress disorder) 09/28/2019   Nipple discharge 04/02/2019   Panic attacks 02/19/2019   Bilateral low back pain 02/19/2019   Seasonal allergies 02/19/2019   Anxiety and depression 01/14/2019   Syncope 01/14/2019   Breast mass, right 01/14/2019   Hypertension     PCP: Grayce Sessions, NP   REFERRING PROVIDER: Eldred Manges, MD   REFERRING DIAG:  Diagnosis  M54.42,M54.41,G89.29 (ICD-10-CM) - Chronic bilateral low back pain with bilateral sciatica    Rationale for Evaluation and Treatment: Rehabilitation  THERAPY DIAG:  Other low back pain  Pain in  thoracic spine  Cervicalgia  Muscle weakness (generalized)  Difficulty in walking, not elsewhere classified  ONSET DATE: over a year  SUBJECTIVE:                                                                                                                                                                                           SUBJECTIVE STATEMENT: Pt indicated feeling improved tolerance to activity for part of the day after last visit.  Reported feeling like the massage  device was helpful.    PERTINENT HISTORY:  4 level cervical fusion November 2023, recent spinal epidural, anxiety, PTSD, carpal tunnel, unstable angina, depression, arthritis, dyspnea, HA, muscle spasm  PAIN:  NPRS scale:  7/10  Pain location: low back and neck Pain description: achy, throbbing Aggravating factors: "basic activities" prolonged standing, house activity  Relieving factors: nothing seems to help  PRECAUTIONS: None  WEIGHT BEARING RESTRICTIONS: No  FALLS:  Has patient fallen in last 6 months? No  LIVING ENVIRONMENT: Lives with: lives with their family and lives with their spouse Lives in: House/apartment Stairs: Yes: External: 5 steps; on left going up Has following equipment at home: None  OCCUPATION: not currently working  PLOF: Independent  PATIENT GOALS: Be able to move better, be able to cook a full meal. Be able to perform ADL's and household chores. Be able to drive more than 10 minutes at a time.   Next MD Visit: 09/13/23   OBJECTIVE:   DIAGNOSTIC FINDINGS:  MRI scan showed moderate stenosis L2-3 mild to moderate at 3 4 facet arthropathy bilateral at L2 bilateral L5-S1 and worse on the right at L4-5.  IMPRESSION: 1. Multifactorial degenerative changes at L2-3 with resultant moderate canal and bilateral L2 foraminal stenosis. 2. Right foraminal to extraforaminal disc protrusions at L3-4 and L4-5, contacting and potentially affecting the exiting right L3 and L4 nerve roots respectively. 3. Moderate to severe lower lumbar facet arthrosis, most pronounced at L4-5 on the right and L5-S1 on the left. Findings could contribute to lower back pain.  PATIENT SURVEYS:  08/08/23: FOTO eval:  25%  SCREENING FOR RED FLAGS: 08/08/23 Bowel or bladder incontinence: No Cauda equina syndrome: No  COGNITION: 08/08/23 Overall cognitive status: WFL normal      SENSATION: 08/08/23 WFL  POSTURE:  08/08/23 rounded shoulders and forward head, increased lumbar  lordosis  PALPATION: 08/08/23 TTP: cervical, throacic and lumbar paraspinals, bil upper traps, bil QL, bil iliosacral joint line  ROM:   LUMBAR AROM 08/08/23 08/28/2023  Flexion Finger tips to top of knees   Extension 0, unable 25% WFL, moved to 50 % c repeated x 5 in  standing.   End range pain noted  Right lateral flexion 16   Left lateral flexion 15   Right rotation Limited 80%   Left rotation limited 80%    (Blank rows = not tested)  CERVICAL AROM 08/08/23 08/28/2023  Flexion 10 22  Extension 12 25  Right lateral flexion 14   Left lateral flexion 11   Right rotation 25 35  Left rotation       32          45    LOWER EXTREMITY ROM:       Right 08/08/23 Left 08/08/23  Hip flexion 80 76  Hip extension    Hip abduction    Hip adduction    Hip internal rotation    Hip external rotation    Knee flexion    Knee extension     (Blank rows = not tested)  LOWER EXTREMITY MMT:    MMT Right 08/08/23 Left 08/08/23  Hip flexion 3- 3-  Hip extension    Hip abduction 3 3  Hip adduction 3 3  Hip internal rotation    Hip external rotation    Knee flexion 4 4  Knee extension 4 4  Cervical extension strength 15.6 pounds with hand-held dynamometer    (Blank rows = not tested)  LUMBAR SPECIAL TESTS:  08/08/23 Slump test: Negative bilateral pt reporting more hamstring tightness bilaterally  FUNCTIONAL TESTS:  08/08/23 5 times sit to stand: 90 seconds c UE support  GAIT: 08/08/23 Distance walked: clinic distances level surface Assistive device utilized: None Level of assistance: Complete Independence Comments: wide BOS, antalgic gait                                                                                                                                                                                                                  TODAY'S TREATMENT:                                                                        DATE: 08/30/2023:  TherEx:  Nustep Lvl 6 10  mins UE/LE for aerobic exercise and mobility  Tband rows green 20x Tband gh ext green 20x    Manual Percussive device to bilateral upper traps, Rt lower lumbar  musculature for myofascial relaxation.   TODAY'S TREATMENT:                                                                        DATE: 08/28/2023:  TherEx:  Nustep Lvl 5 8 mins UE/LE for aerobic exercise and mobility  Standing lumbar extension AROM x 5 Supine lumbar trunk rotation stretch 15 sec x 3 bilaterally  Supine bridge 2-3 sec hold x 10   Neuro Re-ed Pain neuro science education about elevated central nervous system response (comparison to alarm system being turned up) and how elevation of response can contribute to increased sensitivity to light touch, mobility ,etc that can impact daily life.  Detailed ways to improve (education, general mobility, aerobic exercise, activity tolerance improvements with pain range).   Manual Percussive device to bilateral upper traps, Rt lower lumbar musculature for myofascial relaxation.    TODAY'S TREATMENT:                                                                        DATE: 08/21/23:  TherEx:  Rows: green TB x 15 holding 3 sec Chest press: red TB x 15  Supine cervical retraction while lying on towel roll with open book stretch x 10 holding 5 sec  Trunk rotation x 4 holding 60 sec Bridges: x 10 holding 5 sec Upper trap stretch: x 3 holding 10 sec bil Levator scapulae stretch x 3 holding 10 sec  Moist heat: x 5 minutes to cervical spine   TODAY'S TREATMENT:                                                                        DATE: 08/16/2023 Lumbar extension AROM (forearms on wall) 10 x 3 seconds Shoulder blade pinches (SBP) 5 x 5 seconds Seated hamstrings stretch, back straight, slight bend in knee 3 x 20 seconds bilateral, foot on a 6 inch step Gentle levator scapulae stretch 3 x 10 seconds bilateral Cervical extension isometrics 10 x 5 seconds Hip hike at  counter top 10 x 3 seconds (avoid lateral lean, good posture)  Functional Activities: Sit to stand (with SBP at top) 5 x slow eccentrics Walking prescription (start with walking to comfort daily, progress to 20+ minutes 3 days a week) Basic posture and body mechanics including frequent change of position and avoid bending and twisting    PATIENT EDUCATION:  Education details: HEP, POC Person educated: Patient Education method: Programmer, multimedia, Demonstration, Verbal cues, and Handouts Education comprehension: verbalized understanding, returned demonstration, and verbal cues required  HOME EXERCISE PROGRAM: Access Code: [email or web address] URL: [email or web address] Date: 08/08/2023 Prepared by: Narda Amber  Exercises - Seated Hamstring Stretch  - 2 x daily - 7 x weekly - 3  reps - 20-30 seconds hold - Sit to Stand with Counter Support  - 2 x daily - 7 x weekly - 2-3 sets - 5 reps - Standing Lumbar Extension at Wall - Forearms  - 1 x daily - 7 x weekly - 3 sets - 10 reps - Gentle Levator Scapulae Stretch  - 2 x daily - 7 x weekly - 3 reps - 10 seconds hold  ASSESSMENT:  CLINICAL IMPRESSION: Reduced tenderness noted in percussive device use today.  Continued use to reduce myofascial tightness/restriction/symptoms.  Plan to continue use per Pt desires in future. Continued moderate to higher severity reporting of symptoms that impact daily activity tolerance which indicates continued skilled PT services.   OBJECTIVE IMPAIRMENTS: decreased mobility, difficulty walking, decreased ROM, decreased strength, and pain.   ACTIVITY LIMITATIONS: lifting, bending, sitting, standing, squatting, and sleeping  PARTICIPATION LIMITATIONS: cleaning, laundry, driving, and community activity, occupation, sleeping, transfers, cooking, and ADL's  PERSONAL FACTORS: 3+ comorbidities: see pertinent history  are also affecting patient's functional outcome.   REHAB POTENTIAL: Fair due to multiple areas to  address  CLINICAL DECISION MAKING: Evolving/moderate complexity  EVALUATION COMPLEXITY: Moderate   GOALS: Goals reviewed with patient? Yes  SHORT TERM GOALS: (target date for Short term goals are 3 weeks 08/31/23)  1. Patient will demonstrate independent use of home exercise program to maintain progress from in clinic treatments.  Goal status: met 08/28/2023  LONG TERM GOALS: (target dates for all long term goals are 10 weeks  10/19/23 )   1. Patient will demonstrate/report pain at worst less than or equal to 2/10 to facilitate minimal limitation in daily activity secondary to pain symptoms.  Goal status: on going 08/28/2023   2. Patient will demonstrate independent use of home exercise program to facilitate ability to maintain/progress functional gains from skilled physical therapy services.  Goal status:  on going 08/28/2023   3. Patient will demonstrate FOTO outcome > or = 46 % to indicate reduced disability due to condition.  Goal status:  on going 08/28/2023   4. Patient will improve her 5 time sit to stand to </= 30 seconds with/without UE support.   Goal status:  on going 08/28/2023   5.  Pt  will be able to navigate 5 steps with single hand rail with step over step gait pattern.  Goal status:  on going 08/28/2023   6.  Pt will improve her bilateral cervical rotation to >/= 50 degrees to improved functional mobility.  Goal status:  on going 08/28/2023  7. Pt will be able to tolerate sitting for up to 20 minutes with pain </= 3/10 for improved driving tolerance.  Goal Status:  on going 08/28/2023     PLAN:  PT FREQUENCY: 1-2x/week  PT DURATION: 10 weeks  PLANNED INTERVENTIONS: Therapeutic exercises, Therapeutic activity, Neuro Muscular re-education, Balance training, Gait training, Patient/Family education, Joint mobilization, Stair training, DME instructions, Dry Needling, Electrical stimulation, Cryotherapy, vasopneumatic device, Moist heat, Taping, Traction  Ultrasound, Ionotophoresis 4mg /ml Dexamethasone, and aquatic therapy, Manual therapy.  All included unless contraindicated  PLAN FOR NEXT SESSION: Percussive device, graded exercise improvements.    Chyrel Masson, PT, DPT, OCS, ATC 08/30/23  8:44 AM

## 2023-09-04 ENCOUNTER — Encounter: Payer: Self-pay | Admitting: Physical Therapy

## 2023-09-04 ENCOUNTER — Ambulatory Visit (INDEPENDENT_AMBULATORY_CARE_PROVIDER_SITE_OTHER): Payer: BC Managed Care – PPO | Admitting: Physical Therapy

## 2023-09-04 DIAGNOSIS — M6281 Muscle weakness (generalized): Secondary | ICD-10-CM

## 2023-09-04 DIAGNOSIS — M546 Pain in thoracic spine: Secondary | ICD-10-CM | POA: Diagnosis not present

## 2023-09-04 DIAGNOSIS — R262 Difficulty in walking, not elsewhere classified: Secondary | ICD-10-CM | POA: Diagnosis not present

## 2023-09-04 DIAGNOSIS — M5459 Other low back pain: Secondary | ICD-10-CM

## 2023-09-04 DIAGNOSIS — M542 Cervicalgia: Secondary | ICD-10-CM

## 2023-09-04 NOTE — Therapy (Signed)
OUTPATIENT PHYSICAL THERAPY  TREATMENT   Patient Name: Morgan Molina MRN: [id number] DOB:06-15-1976, 47 y.o., female Today's Date: 09/04/2023  END OF SESSION:  PT End of Session - 09/04/23 0819     Visit Number 6    Number of Visits 20    Date for PT Re-Evaluation 10/19/23    PT Start Time 0802    PT Stop Time 0842    PT Time Calculation (min) 40 min    Activity Tolerance Patient tolerated treatment well    Behavior During Therapy [facility] for tasks assessed/performed                  Past Medical History:  Diagnosis Date   Anemia    Anxiety    Arthritis    Depression    Dyspnea    Headache    Hypertension    Muscle spasm    PTSD (post-traumatic stress disorder) 09/28/2019   Syncope    Past Surgical History:  Procedure Laterality Date   ANTERIOR CERVICAL DECOMP/DISCECTOMY FUSION N/A 10/04/2022   Procedure: CERVICAL THREE-FOUR, CERVICAL FOUR-FIVE, CERVICAL FIVE-SIX, CERVICAL SIX-SEVEN, ANTERIOR CERVICAL DISCECTOMY FUSION, ALLOGRAFT, PLATE;  Surgeon: Eldred Manges, MD;  Location: [facility] OR;  Service: Orthopedics;  Laterality: N/A;   BREAST EXCISIONAL BIOPSY Right    CARPAL TUNNEL RELEASE Left 02/20/2020   Procedure: LEFT CARPAL TUNNEL RELEASE;  Surgeon: Betha Loa, MD;  Location: [facility];  Service: Orthopedics;  Laterality: Left;   CARPAL TUNNEL RELEASE Right 02/28/2021   Procedure: CARPAL TUNNEL RELEASE;  Surgeon: Betha Loa, MD;  Location: [facility];  Service: Orthopedics;  Laterality: Right;   CHOLECYSTECTOMY     GANGLION CYST EXCISION Left 02/20/2020   Procedure: LEFT DORSAL GANGLION EXCISION;  Surgeon: Betha Loa, MD;  Location: [facility];  Service: Orthopedics;  Laterality: Left;   TRIGGER FINGER RELEASE Bilateral 08/29/2021   Procedure: RELEASE TRIGGER FINGER/A-1 PULLEY BILATERAL THUMBS;  Surgeon: Betha Loa, MD;  Location: [facility];  Service: Orthopedics;  Laterality: Bilateral;  30 MIN    TUBAL LIGATION     Patient Active Problem List   Diagnosis Date Noted   Status post cervical spinal fusion 07/13/2023   Spinal stenosis of lumbar region 07/13/2023   Cervical spinal stenosis 10/04/2022   Spinal stenosis of cervical region 08/30/2022   Foraminal stenosis of cervical region 08/30/2022   Chest pain 03/17/2022   Unstable angina (HCC) 03/15/2022   Paresthesias 06/23/2021   Neck pain 06/23/2021   Alteration consciousness 06/23/2021   Anxiety 06/23/2021   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   Arthralgia 03/30/2021   Hyperglycemia 03/30/2021   Shortness of breath 03/30/2021   Bilateral chronic serous otitis media 08/15/2020   Eustachian tube dysfunction, bilateral 08/15/2020   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   Ganglion cyst of dorsum of left wrist 10/01/2019   Decreased visual acuity 10/01/2019   PTSD (post-traumatic stress disorder) 09/28/2019   Nipple discharge 04/02/2019   Panic attacks 02/19/2019   Bilateral low back pain 02/19/2019   Seasonal allergies 02/19/2019   Anxiety and depression 01/14/2019   Syncope 01/14/2019   Breast mass, right 01/14/2019   Hypertension     PCP: Grayce Sessions, NP   REFERRING PROVIDER: Eldred Manges, MD   REFERRING DIAG:  Diagnosis  M54.42,M54.41,G89.29 (ICD-10-CM) - Chronic bilateral low back pain with bilateral sciatica    Rationale for Evaluation and Treatment: Rehabilitation  THERAPY DIAG:  Other low back pain  Pain  in thoracic spine  Cervicalgia  Difficulty in walking, not elsewhere classified  Muscle weakness (generalized)  ONSET DATE: over a year  SUBJECTIVE:                                                                                                                                                                                           SUBJECTIVE STATEMENT: Pt stating relief of several hours following PT sessions but then pain returns.    PERTINENT HISTORY:  4 level cervical  fusion November 2023, recent spinal epidural, anxiety, PTSD, carpal tunnel, unstable angina, depression, arthritis, dyspnea, HA, muscle spasm  PAIN:  NPRS scale:  7-8/10  Pain location: low back and neck Pain description: achy, throbbing Aggravating factors: "basic activities" prolonged standing, house activity  Relieving factors: nothing seems to help  PRECAUTIONS: None  WEIGHT BEARING RESTRICTIONS: No  FALLS:  Has patient fallen in last 6 months? No  LIVING ENVIRONMENT: Lives with: lives with their family and lives with their spouse Lives in: House/apartment Stairs: Yes: External: 5 steps; on left going up Has following equipment at home: None  OCCUPATION: not currently working  PLOF: Independent  PATIENT GOALS: Be able to move better, be able to cook a full meal. Be able to perform ADL's and household chores. Be able to drive more than 10 minutes at a time.   Next MD Visit: 09/13/23   OBJECTIVE:   DIAGNOSTIC FINDINGS:  MRI scan showed moderate stenosis L2-3 mild to moderate at 3 4 facet arthropathy bilateral at L2 bilateral L5-S1 and worse on the right at L4-5.  IMPRESSION: 1. Multifactorial degenerative changes at L2-3 with resultant moderate canal and bilateral L2 foraminal stenosis. 2. Right foraminal to extraforaminal disc protrusions at L3-4 and L4-5, contacting and potentially affecting the exiting right L3 and L4 nerve roots respectively. 3. Moderate to severe lower lumbar facet arthrosis, most pronounced at L4-5 on the right and L5-S1 on the left. Findings could contribute to lower back pain.  PATIENT SURVEYS:  08/08/23: FOTO eval:  25%  SCREENING FOR RED FLAGS: 08/08/23 Bowel or bladder incontinence: No Cauda equina syndrome: No  COGNITION: 08/08/23 Overall cognitive status: WFL normal      SENSATION: 08/08/23 WFL  POSTURE:  08/08/23 rounded shoulders and forward head, increased lumbar lordosis  PALPATION: 08/08/23 TTP: cervical, throacic and  lumbar paraspinals, bil upper traps, bil QL, bil iliosacral joint line  ROM:   LUMBAR AROM 08/08/23 08/28/2023  Flexion Finger tips to top of knees   Extension 0, unable 25% WFL, moved to 50 % c repeated x 5 in standing.   End range pain noted  Right lateral  flexion 16   Left lateral flexion 15   Right rotation Limited 80%   Left rotation limited 80%    (Blank rows = not tested)  CERVICAL AROM 08/08/23 08/28/2023  Flexion 10 22  Extension 12 25  Right lateral flexion 14   Left lateral flexion 11   Right rotation 25 35  Left rotation       32          45    LOWER EXTREMITY ROM:       Right 08/08/23 Left 08/08/23  Hip flexion 80 76  Hip extension    Hip abduction    Hip adduction    Hip internal rotation    Hip external rotation    Knee flexion    Knee extension     (Blank rows = not tested)  LOWER EXTREMITY MMT:    MMT Right 08/08/23 Left 08/08/23  Hip flexion 3- 3-  Hip extension    Hip abduction 3 3  Hip adduction 3 3  Hip internal rotation    Hip external rotation    Knee flexion 4 4  Knee extension 4 4  Cervical extension strength 15.6 pounds with hand-held dynamometer    (Blank rows = not tested)  LUMBAR SPECIAL TESTS:  08/08/23 Slump test: Negative bilateral pt reporting more hamstring tightness bilaterally  FUNCTIONAL TESTS:  08/08/23 5 times sit to stand: 90 seconds c UE support  GAIT: 08/08/23 Distance walked: clinic distances level surface Assistive device utilized: None Level of assistance: Complete Independence Comments: wide BOS, antalgic gait                                                                                                                                                                                                                 TODAY'S TREATMENT:                                                                        DATE: 09/03/23:  TherEx:  Nustep: Level 7 x 8 minutes Rows: red TB 2 x 15 holding 3 sec Shoulder Extension:  green TB 2 x 15  Door stretch standing and leaning away x 3 holding 10 sec Door stretch: 90 degrees x 5 holding 10 sec  Manual:  Percussion device to bilateral upper traps, Rt side rhomboids: pt with limited tolerance, STM using massage cream and Biofreeeze was performed with mild IASTM. Active trigger point release to pt's bil upper traps as tolerated.       TODAY'S TREATMENT:                                                                        DATE: 08/30/2023:  TherEx:  Nustep Lvl 6 10 mins UE/LE for aerobic exercise and mobility  Tband rows green 20x Tband gh ext green 20x   Manual Percussive device to bilateral upper traps, Rt lower lumbar musculature for myofascial relaxation.     TODAY'S TREATMENT:                                                                        DATE: 08/28/2023:  TherEx:  Nustep Lvl 5 8 mins UE/LE for aerobic exercise and mobility  Standing lumbar extension AROM x 5 Supine lumbar trunk rotation stretch 15 sec x 3 bilaterally  Supine bridge 2-3 sec hold x 10   Neuro Re-ed Pain neuro science education about elevated central nervous system response (comparison to alarm system being turned up) and how elevation of response can contribute to increased sensitivity to light touch, mobility ,etc that can impact daily life.  Detailed ways to improve (education, general mobility, aerobic exercise, activity tolerance improvements with pain range).   Manual Percussive device to bilateral upper traps, Rt lower lumbar musculature for myofascial relaxation.    TODAY'S TREATMENT:                                                                        DATE: 08/21/23:  TherEx:  Rows: green TB x 15 holding 3 sec Chest press: red TB x 15  Supine cervical retraction while lying on towel roll with open book stretch x 10 holding 5 sec  Trunk rotation x 4 holding 60 sec Bridges: x 10 holding 5 sec Upper trap stretch: x 3 holding 10 sec bil Levator scapulae stretch x  3 holding 10 sec  Moist heat: x 5 minutes to cervical spine   TODAY'S TREATMENT:                                                                        DATE: 08/16/2023 Lumbar extension AROM (forearms on wall) 10 x 3 seconds Shoulder blade pinches (SBP) 5 x 5 seconds Seated hamstrings stretch, back  straight, slight bend in knee 3 x 20 seconds bilateral, foot on a 6 inch step Gentle levator scapulae stretch 3 x 10 seconds bilateral Cervical extension isometrics 10 x 5 seconds Hip hike at counter top 10 x 3 seconds (avoid lateral lean, good posture)  Functional Activities: Sit to stand (with SBP at top) 5 x slow eccentrics Walking prescription (start with walking to comfort daily, progress to 20+ minutes 3 days a week) Basic posture and body mechanics including frequent change of position and avoid bending and twisting    PATIENT EDUCATION:  Education details: HEP, POC Person educated: Patient Education method: Explanation, Demonstration, Verbal cues, and Handouts Education comprehension: verbalized understanding, returned demonstration, and verbal cues required  HOME EXERCISE PROGRAM: Access Code: [email or web address] URL: [email or web address] Date: 08/08/2023 Prepared by: Narda Amber  Exercises - Seated Hamstring Stretch  - 2 x daily - 7 x weekly - 3 reps - 20-30 seconds hold - Sit to Stand with Counter Support  - 2 x daily - 7 x weekly - 2-3 sets - 5 reps - Standing Lumbar Extension at Wall - Forearms  - 1 x daily - 7 x weekly - 3 sets - 10 reps - Gentle Levator Scapulae Stretch  - 2 x daily - 7 x weekly - 3 reps - 10 seconds hold  ASSESSMENT:  CLINICAL IMPRESSION: Pt arriving today with more tenderness in her upper traps and levator scapulae with difficulty tolerating percussion device. Pt was able to tolerate exercises well today. Pt was able to tolerate manual therapy and reporting less pain following session. Continue to progress pt toward her LTG's.   OBJECTIVE  IMPAIRMENTS: decreased mobility, difficulty walking, decreased ROM, decreased strength, and pain.   ACTIVITY LIMITATIONS: lifting, bending, sitting, standing, squatting, and sleeping  PARTICIPATION LIMITATIONS: cleaning, laundry, driving, and community activity, occupation, sleeping, transfers, cooking, and ADL's  PERSONAL FACTORS: 3+ comorbidities: see pertinent history  are also affecting patient's functional outcome.   REHAB POTENTIAL: Fair due to multiple areas to address  CLINICAL DECISION MAKING: Evolving/moderate complexity  EVALUATION COMPLEXITY: Moderate   GOALS: Goals reviewed with patient? Yes  SHORT TERM GOALS: (target date for Short term goals are 3 weeks 08/31/23)  1. Patient will demonstrate independent use of home exercise program to maintain progress from in clinic treatments.  Goal status: met 08/28/2023  LONG TERM GOALS: (target dates for all long term goals are 10 weeks  10/19/23 )   1. Patient will demonstrate/report pain at worst less than or equal to 2/10 to facilitate minimal limitation in daily activity secondary to pain symptoms.  Goal status: on going 08/28/2023   2. Patient will demonstrate independent use of home exercise program to facilitate ability to maintain/progress functional gains from skilled physical therapy services.  Goal status:  on going 08/28/2023   3. Patient will demonstrate FOTO outcome > or = 46 % to indicate reduced disability due to condition.  Goal status:  on going 08/28/2023   4. Patient will improve her 5 time sit to stand to </= 30 seconds with/without UE support.   Goal status:  on going 08/28/2023   5.  Pt  will be able to navigate 5 steps with single hand rail with step over step gait pattern.  Goal status:  on going 08/28/2023   6.  Pt will improve her bilateral cervical rotation to >/= 50 degrees to improved functional mobility.  Goal status:  on going 08/28/2023  7. Pt will be able to tolerate sitting for up  to 20 minutes with pain </= 3/10 for improved driving tolerance.  Goal Status:  on going 08/28/2023     PLAN:  PT FREQUENCY: 1-2x/week  PT DURATION: 10 weeks  PLANNED INTERVENTIONS: Therapeutic exercises, Therapeutic activity, Neuro Muscular re-education, Balance training, Gait training, Patient/Family education, Joint mobilization, Stair training, DME instructions, Dry Needling, Electrical stimulation, Cryotherapy, vasopneumatic device, Moist heat, Taping, Traction Ultrasound, Ionotophoresis 4mg /ml Dexamethasone, and aquatic therapy, Manual therapy.  All included unless contraindicated  PLAN FOR NEXT SESSION: Percussive device/ manual therapy as toleratd, graded exercise improvements.    Narda Amber, PT, MPT 09/04/23 8:46 AM   09/04/23  8:46 AM

## 2023-09-06 ENCOUNTER — Encounter: Payer: BC Managed Care – PPO | Admitting: Rehabilitative and Restorative Service Providers"

## 2023-09-11 ENCOUNTER — Encounter: Payer: Self-pay | Admitting: Physical Therapy

## 2023-09-11 ENCOUNTER — Ambulatory Visit (INDEPENDENT_AMBULATORY_CARE_PROVIDER_SITE_OTHER): Payer: BC Managed Care – PPO | Admitting: Physical Therapy

## 2023-09-11 DIAGNOSIS — R262 Difficulty in walking, not elsewhere classified: Secondary | ICD-10-CM | POA: Diagnosis not present

## 2023-09-11 DIAGNOSIS — M6281 Muscle weakness (generalized): Secondary | ICD-10-CM

## 2023-09-11 DIAGNOSIS — M546 Pain in thoracic spine: Secondary | ICD-10-CM | POA: Diagnosis not present

## 2023-09-11 DIAGNOSIS — M5459 Other low back pain: Secondary | ICD-10-CM | POA: Diagnosis not present

## 2023-09-11 DIAGNOSIS — M542 Cervicalgia: Secondary | ICD-10-CM

## 2023-09-11 NOTE — Therapy (Signed)
OUTPATIENT PHYSICAL THERAPY  TREATMENT   Patient Name: Morgan Molina MRN: [id number] DOB:Feb 29, 1976, 47 y.o., female Today's Date: 09/11/2023  END OF SESSION:  PT End of Session - 09/11/23 0800     Visit Number 7    Number of Visits 20    Date for PT Re-Evaluation 10/19/23    PT Start Time 0800    PT Stop Time 0840    PT Time Calculation (min) 40 min    Activity Tolerance Patient tolerated treatment well    Behavior During Therapy [facility] for tasks assessed/performed                   Past Medical History:  Diagnosis Date   Anemia    Anxiety    Arthritis    Depression    Dyspnea    Headache    Hypertension    Muscle spasm    PTSD (post-traumatic stress disorder) 09/28/2019   Syncope    Past Surgical History:  Procedure Laterality Date   ANTERIOR CERVICAL DECOMP/DISCECTOMY FUSION N/A 10/04/2022   Procedure: CERVICAL THREE-FOUR, CERVICAL FOUR-FIVE, CERVICAL FIVE-SIX, CERVICAL SIX-SEVEN, ANTERIOR CERVICAL DISCECTOMY FUSION, ALLOGRAFT, PLATE;  Surgeon: Eldred Manges, MD;  Location: [facility] OR;  Service: Orthopedics;  Laterality: N/A;   BREAST EXCISIONAL BIOPSY Right    CARPAL TUNNEL RELEASE Left 02/20/2020   Procedure: LEFT CARPAL TUNNEL RELEASE;  Surgeon: Betha Loa, MD;  Location: [facility];  Service: Orthopedics;  Laterality: Left;   CARPAL TUNNEL RELEASE Right 02/28/2021   Procedure: CARPAL TUNNEL RELEASE;  Surgeon: Betha Loa, MD;  Location: [facility];  Service: Orthopedics;  Laterality: Right;   CHOLECYSTECTOMY     GANGLION CYST EXCISION Left 02/20/2020   Procedure: LEFT DORSAL GANGLION EXCISION;  Surgeon: Betha Loa, MD;  Location: [facility];  Service: Orthopedics;  Laterality: Left;   TRIGGER FINGER RELEASE Bilateral 08/29/2021   Procedure: RELEASE TRIGGER FINGER/A-1 PULLEY BILATERAL THUMBS;  Surgeon: Betha Loa, MD;  Location: [facility];  Service: Orthopedics;  Laterality: Bilateral;  30 MIN    TUBAL LIGATION     Patient Active Problem List   Diagnosis Date Noted   Status post cervical spinal fusion 07/13/2023   Spinal stenosis of lumbar region 07/13/2023   Cervical spinal stenosis 10/04/2022   Spinal stenosis of cervical region 08/30/2022   Foraminal stenosis of cervical region 08/30/2022   Chest pain 03/17/2022   Unstable angina (HCC) 03/15/2022   Paresthesias 06/23/2021   Neck pain 06/23/2021   Alteration consciousness 06/23/2021   Anxiety 06/23/2021   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   Arthralgia 03/30/2021   Hyperglycemia 03/30/2021   Shortness of breath 03/30/2021   Bilateral chronic serous otitis media 08/15/2020   Eustachian tube dysfunction, bilateral 08/15/2020   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   Ganglion cyst of dorsum of left wrist 10/01/2019   Decreased visual acuity 10/01/2019   PTSD (post-traumatic stress disorder) 09/28/2019   Nipple discharge 04/02/2019   Panic attacks 02/19/2019   Bilateral low back pain 02/19/2019   Seasonal allergies 02/19/2019   Anxiety and depression 01/14/2019   Syncope 01/14/2019   Breast mass, right 01/14/2019   Hypertension     PCP: Grayce Sessions, NP   REFERRING PROVIDER: Eldred Manges, MD   REFERRING DIAG:  Diagnosis  M54.42,M54.41,G89.29 (ICD-10-CM) - Chronic bilateral low back pain with bilateral sciatica    Rationale for Evaluation and Treatment: Rehabilitation  THERAPY DIAG:  Other low back pain  Pain in thoracic spine  Cervicalgia  Difficulty in walking, not elsewhere classified  Muscle weakness (generalized)  ONSET DATE: over a year  SUBJECTIVE:                                                                                                                                                                                           SUBJECTIVE STATEMENT: Feels better than Thursday; "I'm hurting all the time."  PERTINENT HISTORY:  4 level cervical fusion November 2023, recent  spinal epidural, anxiety, PTSD, carpal tunnel, unstable angina, depression, arthritis, dyspnea, HA, muscle spasm  PAIN:  NPRS scale:  7-8/10  Pain location: low back and neck Pain description: achy, throbbing Aggravating factors: "basic activities" prolonged standing, house activity  Relieving factors: nothing seems to help  PRECAUTIONS: None  WEIGHT BEARING RESTRICTIONS: No  FALLS:  Has patient fallen in last 6 months? No  LIVING ENVIRONMENT: Lives with: lives with their family and lives with their spouse Lives in: House/apartment Stairs: Yes: External: 5 steps; on left going up Has following equipment at home: None  OCCUPATION: not currently working  PLOF: Independent  PATIENT GOALS: Be able to move better, be able to cook a full meal. Be able to perform ADL's and household chores. Be able to drive more than 10 minutes at a time.     OBJECTIVE:   DIAGNOSTIC FINDINGS:  MRI scan showed moderate stenosis L2-3 mild to moderate at 3 4 facet arthropathy bilateral at L2 bilateral L5-S1 and worse on the right at L4-5.  IMPRESSION: 1. Multifactorial degenerative changes at L2-3 with resultant moderate canal and bilateral L2 foraminal stenosis. 2. Right foraminal to extraforaminal disc protrusions at L3-4 and L4-5, contacting and potentially affecting the exiting right L3 and L4 nerve roots respectively. 3. Moderate to severe lower lumbar facet arthrosis, most pronounced at L4-5 on the right and L5-S1 on the left. Findings could contribute to lower back pain.  PATIENT SURVEYS:  08/08/23: FOTO eval:  25% 09/11/23: FOTO: 32  SCREENING FOR RED FLAGS: 08/08/23 Bowel or bladder incontinence: No Cauda equina syndrome: No  COGNITION: 08/08/23 Overall cognitive status: WFL normal      SENSATION: 08/08/23 WFL  POSTURE:  08/08/23 rounded shoulders and forward head, increased lumbar lordosis  PALPATION: 08/08/23 TTP: cervical, throacic and lumbar paraspinals, bil upper traps,  bil QL, bil iliosacral joint line  ROM:   LUMBAR AROM 08/08/23 08/28/2023  Flexion Finger tips to top of knees   Extension 0, unable 25% WFL, moved to 50 % c repeated x 5 in standing.   End range pain noted  Right lateral flexion 16   Left lateral  flexion 15   Right rotation Limited 80%   Left rotation limited 80%    (Blank rows = not tested)  CERVICAL AROM 08/08/23 08/28/2023  Flexion 10 22  Extension 12 25  Right lateral flexion 14   Left lateral flexion 11   Right rotation 25 35  Left rotation       32          45    LOWER EXTREMITY ROM:       Right 08/08/23 Left 08/08/23  Hip flexion 80 76  Hip extension    Hip abduction    Hip adduction    Hip internal rotation    Hip external rotation    Knee flexion    Knee extension     (Blank rows = not tested)  LOWER EXTREMITY MMT:    MMT Right 08/08/23 Left 08/08/23  Hip flexion 3- 3-  Hip extension    Hip abduction 3 3  Hip adduction 3 3  Hip internal rotation    Hip external rotation    Knee flexion 4 4  Knee extension 4 4  Cervical extension strength 15.6 pounds with hand-held dynamometer    (Blank rows = not tested)  LUMBAR SPECIAL TESTS:  08/08/23 Slump test: Negative bilateral pt reporting more hamstring tightness bilaterally  FUNCTIONAL TESTS:  08/08/23 5 times sit to stand: 90 seconds c UE support  GAIT: 08/08/23 Distance walked: clinic distances level surface Assistive device utilized: None Level of assistance: Complete Independence Comments: wide BOS, antalgic gait                                                                                                                                                                                                                  TODAY'S TREATMENT:                                                                        DATE: 09/10/23:  TherEx:  Nustep: Level 7 x 8 minutes Rows L3 band 2x15; 3 sec hold Shoulder Extension: L3; 2 x 15   Manual:  Percussion  device to bilateral upper traps, rhomboid; infraspinatus and teres minor; holds at trigger points with good tolerance today    TODAY'S TREATMENT:  DATE: 09/03/23:  TherEx:  Nustep: Level 7 x 8 minutes Rows: red TB 2 x 15 holding 3 sec Shoulder Extension: green TB 2 x 15  Door stretch standing and leaning away x 3 holding 10 sec Door stretch: 90 degrees x 5 holding 10 sec  Manual:  Percussion device to bilateral upper traps, Rt side rhomboids: pt with limited tolerance, STM using massage cream and Biofreeeze was performed with mild IASTM. Active trigger point release to pt's bil upper traps as tolerated.       TODAY'S TREATMENT:                                                                        DATE: 08/30/2023:  TherEx:  Nustep Lvl 6 10 mins UE/LE for aerobic exercise and mobility  Tband rows green 20x Tband gh ext green 20x   Manual Percussive device to bilateral upper traps, Rt lower lumbar musculature for myofascial relaxation.     TODAY'S TREATMENT:                                                                        DATE: 08/28/2023:  TherEx:  Nustep Lvl 5 8 mins UE/LE for aerobic exercise and mobility  Standing lumbar extension AROM x 5 Supine lumbar trunk rotation stretch 15 sec x 3 bilaterally  Supine bridge 2-3 sec hold x 10   Neuro Re-ed Pain neuro science education about elevated central nervous system response (comparison to alarm system being turned up) and how elevation of response can contribute to increased sensitivity to light touch, mobility ,etc that can impact daily life.  Detailed ways to improve (education, general mobility, aerobic exercise, activity tolerance improvements with pain range).   Manual Percussive device to bilateral upper traps, Rt lower lumbar musculature for myofascial relaxation.    TODAY'S TREATMENT:                                                                         DATE: 08/21/23:  TherEx:  Rows: green TB x 15 holding 3 sec Chest press: red TB x 15  Supine cervical retraction while lying on towel roll with open book stretch x 10 holding 5 sec  Trunk rotation x 4 holding 60 sec Bridges: x 10 holding 5 sec Upper trap stretch: x 3 holding 10 sec bil Levator scapulae stretch x 3 holding 10 sec  Moist heat: x 5 minutes to cervical spine   TODAY'S TREATMENT:  DATE: 08/16/2023 Lumbar extension AROM (forearms on wall) 10 x 3 seconds Shoulder blade pinches (SBP) 5 x 5 seconds Seated hamstrings stretch, back straight, slight bend in knee 3 x 20 seconds bilateral, foot on a 6 inch step Gentle levator scapulae stretch 3 x 10 seconds bilateral Cervical extension isometrics 10 x 5 seconds Hip hike at counter top 10 x 3 seconds (avoid lateral lean, good posture)  Functional Activities: Sit to stand (with SBP at top) 5 x slow eccentrics Walking prescription (start with walking to comfort daily, progress to 20+ minutes 3 days a week) Basic posture and body mechanics including frequent change of position and avoid bending and twisting    PATIENT EDUCATION:  Education details: HEP, POC Person educated: Patient Education method: Programmer, multimedia, Demonstration, Verbal cues, and Handouts Education comprehension: verbalized understanding, returned demonstration, and verbal cues required  HOME EXERCISE PROGRAM: Access Code: [email or web address] URL: [email or web address] Date: 08/08/2023 Prepared by: Narda Amber  Exercises - Seated Hamstring Stretch  - 2 x daily - 7 x weekly - 3 reps - 20-30 seconds hold - Sit to Stand with Counter Support  - 2 x daily - 7 x weekly - 2-3 sets - 5 reps - Standing Lumbar Extension at Wall - Forearms  - 1 x daily - 7 x weekly - 3 sets - 10 reps - Gentle Levator Scapulae Stretch  - 2 x daily - 7 x weekly - 3 reps - 10 seconds  hold  ASSESSMENT:  CLINICAL IMPRESSION: Pt tolerated session well today, but continues to report continued pain rated high.  She did demonstrate a 7 point improvement in [place].  Continue skilled PT.  OBJECTIVE IMPAIRMENTS: decreased mobility, difficulty walking, decreased ROM, decreased strength, and pain.   ACTIVITY LIMITATIONS: lifting, bending, sitting, standing, squatting, and sleeping  PARTICIPATION LIMITATIONS: cleaning, laundry, driving, and community activity, occupation, sleeping, transfers, cooking, and ADL's  PERSONAL FACTORS: 3+ comorbidities: see pertinent history  are also affecting patient's functional outcome.   REHAB POTENTIAL: Fair due to multiple areas to address  CLINICAL DECISION MAKING: Evolving/moderate complexity  EVALUATION COMPLEXITY: Moderate   GOALS: Goals reviewed with patient? Yes  SHORT TERM GOALS: (target date for Short term goals are 3 weeks 08/31/23)  1. Patient will demonstrate independent use of home exercise program to maintain progress from in clinic treatments.  Goal status: met 08/28/2023  LONG TERM GOALS: (target dates for all long term goals are 10 weeks  10/19/23 )   1. Patient will demonstrate/report pain at worst less than or equal to 2/10 to facilitate minimal limitation in daily activity secondary to pain symptoms.  Goal status: on going 08/28/2023   2. Patient will demonstrate independent use of home exercise program to facilitate ability to maintain/progress functional gains from skilled physical therapy services.  Goal status:  on going 08/28/2023   3. Patient will demonstrate FOTO outcome > or = 46 % to indicate reduced disability due to condition.  Goal status:  on going 08/28/2023   4. Patient will improve her 5 time sit to stand to </= 30 seconds with/without UE support.   Goal status:  on going 08/28/2023   5.  Pt  will be able to navigate 5 steps with single hand rail with step over step gait pattern.  Goal  status:  on going 08/28/2023   6.  Pt will improve her bilateral cervical rotation to >/= 50 degrees to improved functional mobility.  Goal status:  on going 08/28/2023  7. Pt will be able to  tolerate sitting for up to 20 minutes with pain </= 3/10 for improved driving tolerance.  Goal Status:  on going 08/28/2023     PLAN:  PT FREQUENCY: 1-2x/week  PT DURATION: 10 weeks  PLANNED INTERVENTIONS: Therapeutic exercises, Therapeutic activity, Neuro Muscular re-education, Balance training, Gait training, Patient/Family education, Joint mobilization, Stair training, DME instructions, Dry Needling, Electrical stimulation, Cryotherapy, vasopneumatic device, Moist heat, Taping, Traction Ultrasound, Ionotophoresis 4mg /ml Dexamethasone, and aquatic therapy, Manual therapy.  All included unless contraindicated  PLAN FOR NEXT SESSION: continue percussive device/ manual therapy as toleratd, graded exercise improvements. Will need MD note   Next MD Visit: 09/14/23   Clarita Crane, PT, DPT 09/11/23 10:13 AM

## 2023-09-13 ENCOUNTER — Ambulatory Visit: Payer: BC Managed Care – PPO | Admitting: Rehabilitative and Restorative Service Providers"

## 2023-09-13 ENCOUNTER — Encounter: Payer: Self-pay | Admitting: Rehabilitative and Restorative Service Providers"

## 2023-09-13 DIAGNOSIS — M546 Pain in thoracic spine: Secondary | ICD-10-CM | POA: Diagnosis not present

## 2023-09-13 DIAGNOSIS — M542 Cervicalgia: Secondary | ICD-10-CM

## 2023-09-13 DIAGNOSIS — M5459 Other low back pain: Secondary | ICD-10-CM | POA: Diagnosis not present

## 2023-09-13 DIAGNOSIS — R262 Difficulty in walking, not elsewhere classified: Secondary | ICD-10-CM | POA: Diagnosis not present

## 2023-09-13 DIAGNOSIS — M6281 Muscle weakness (generalized): Secondary | ICD-10-CM

## 2023-09-13 NOTE — Therapy (Signed)
OUTPATIENT PHYSICAL THERAPY  TREATMENT   Patient Name: Morgan Molina MRN: [id number] DOB:May 01, 1976, 47 y.o., female Today's Date: 09/13/2023  END OF SESSION:  PT End of Session - 09/13/23 0757     Visit Number 8    Number of Visits 20    Date for PT Re-Evaluation 10/19/23    PT Start Time 0757    PT Stop Time 0835    PT Time Calculation (min) 38 min    Activity Tolerance Patient tolerated treatment well    Behavior During Therapy [facility] for tasks assessed/performed                    Past Medical History:  Diagnosis Date   Anemia    Anxiety    Arthritis    Depression    Dyspnea    Headache    Hypertension    Muscle spasm    PTSD (post-traumatic stress disorder) 09/28/2019   Syncope    Past Surgical History:  Procedure Laterality Date   ANTERIOR CERVICAL DECOMP/DISCECTOMY FUSION N/A 10/04/2022   Procedure: CERVICAL THREE-FOUR, CERVICAL FOUR-FIVE, CERVICAL FIVE-SIX, CERVICAL SIX-SEVEN, ANTERIOR CERVICAL DISCECTOMY FUSION, ALLOGRAFT, PLATE;  Surgeon: Eldred Manges, MD;  Location: [facility] OR;  Service: Orthopedics;  Laterality: N/A;   BREAST EXCISIONAL BIOPSY Right    CARPAL TUNNEL RELEASE Left 02/20/2020   Procedure: LEFT CARPAL TUNNEL RELEASE;  Surgeon: Betha Loa, MD;  Location: [facility];  Service: Orthopedics;  Laterality: Left;   CARPAL TUNNEL RELEASE Right 02/28/2021   Procedure: CARPAL TUNNEL RELEASE;  Surgeon: Betha Loa, MD;  Location: [facility];  Service: Orthopedics;  Laterality: Right;   CHOLECYSTECTOMY     GANGLION CYST EXCISION Left 02/20/2020   Procedure: LEFT DORSAL GANGLION EXCISION;  Surgeon: Betha Loa, MD;  Location: [facility];  Service: Orthopedics;  Laterality: Left;   TRIGGER FINGER RELEASE Bilateral 08/29/2021   Procedure: RELEASE TRIGGER FINGER/A-1 PULLEY BILATERAL THUMBS;  Surgeon: Betha Loa, MD;  Location: [facility];  Service: Orthopedics;  Laterality: Bilateral;  30  MIN   TUBAL LIGATION     Patient Active Problem List   Diagnosis Date Noted   Status post cervical spinal fusion 07/13/2023   Spinal stenosis of lumbar region 07/13/2023   Cervical spinal stenosis 10/04/2022   Spinal stenosis of cervical region 08/30/2022   Foraminal stenosis of cervical region 08/30/2022   Chest pain 03/17/2022   Unstable angina (HCC) 03/15/2022   Paresthesias 06/23/2021   Neck pain 06/23/2021   Alteration consciousness 06/23/2021   Anxiety 06/23/2021   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   Arthralgia 03/30/2021   Hyperglycemia 03/30/2021   Shortness of breath 03/30/2021   Bilateral chronic serous otitis media 08/15/2020   Eustachian tube dysfunction, bilateral 08/15/2020   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   Ganglion cyst of dorsum of left wrist 10/01/2019   Decreased visual acuity 10/01/2019   PTSD (post-traumatic stress disorder) 09/28/2019   Nipple discharge 04/02/2019   Panic attacks 02/19/2019   Bilateral low back pain 02/19/2019   Seasonal allergies 02/19/2019   Anxiety and depression 01/14/2019   Syncope 01/14/2019   Breast mass, right 01/14/2019   Hypertension     PCP: Grayce Sessions, NP   REFERRING PROVIDER: Eldred Manges, MD   REFERRING DIAG:  Diagnosis  M54.42,M54.41,G89.29 (ICD-10-CM) - Chronic bilateral low back pain with bilateral sciatica    Rationale for Evaluation and Treatment: Rehabilitation  THERAPY DIAG:  Other low back pain  Pain in thoracic spine  Cervicalgia  Difficulty in walking, not elsewhere classified  Muscle weakness (generalized)  ONSET DATE: over a year  SUBJECTIVE:                                                                                                                                                                                           SUBJECTIVE STATEMENT: Pt indicated down to 6/10 but a driving 2 hours bothered symptoms.  Reported 8/10 today.    PERTINENT HISTORY:  4 level  cervical fusion November 2023, recent spinal epidural, anxiety, PTSD, carpal tunnel, unstable angina, depression, arthritis, dyspnea, HA, muscle spasm  PAIN:  NPRS scale:  8/10 Pain location: low back and neck Pain description: achy, throbbing Aggravating factors: "basic activities" prolonged standing, house activity  Relieving factors: nothing seems to help  PRECAUTIONS: None  WEIGHT BEARING RESTRICTIONS: No  FALLS:  Has patient fallen in last 6 months? No  LIVING ENVIRONMENT: Lives with: lives with their family and lives with their spouse Lives in: House/apartment Stairs: Yes: External: 5 steps; on left going up Has following equipment at home: None  OCCUPATION: not currently working  PLOF: Independent  PATIENT GOALS: Be able to move better, be able to cook a full meal. Be able to perform ADL's and household chores. Be able to drive more than 10 minutes at a time.     OBJECTIVE:   DIAGNOSTIC FINDINGS:  MRI scan showed moderate stenosis L2-3 mild to moderate at 3 4 facet arthropathy bilateral at L2 bilateral L5-S1 and worse on the right at L4-5.  IMPRESSION: 1. Multifactorial degenerative changes at L2-3 with resultant moderate canal and bilateral L2 foraminal stenosis. 2. Right foraminal to extraforaminal disc protrusions at L3-4 and L4-5, contacting and potentially affecting the exiting right L3 and L4 nerve roots respectively. 3. Moderate to severe lower lumbar facet arthrosis, most pronounced at L4-5 on the right and L5-S1 on the left. Findings could contribute to lower back pain.  PATIENT SURVEYS:  09/11/23: FOTO: 32 08/08/23: FOTO eval:  25%   SCREENING FOR RED FLAGS: 08/08/23 Bowel or bladder incontinence: No Cauda equina syndrome: No  COGNITION: 08/08/23 Overall cognitive status: WFL normal      SENSATION: 08/08/23 WFL  POSTURE:  08/08/23 rounded shoulders and forward head, increased lumbar lordosis  PALPATION: 08/08/23 TTP: cervical, throacic and  lumbar paraspinals, bil upper traps, bil QL, bil iliosacral joint line  ROM:   LUMBAR AROM 08/08/23 08/28/2023  Flexion Finger tips to top of knees   Extension 0, unable 25% WFL, moved to 50 % c repeated x 5 in standing.   End range pain noted  Right lateral flexion 16   Left lateral flexion 15   Right rotation Limited 80%   Left rotation limited 80%    (Blank rows = not tested)  CERVICAL AROM 08/08/23 08/28/2023  Flexion 10 22  Extension 12 25  Right lateral flexion 14   Left lateral flexion 11   Right rotation 25 35  Left rotation       32          45    LOWER EXTREMITY ROM:       Right 08/08/23 Left 08/08/23  Hip flexion 80 76  Hip extension    Hip abduction    Hip adduction    Hip internal rotation    Hip external rotation    Knee flexion    Knee extension     (Blank rows = not tested)  LOWER EXTREMITY MMT:    MMT Right 08/08/23 Left 08/08/23  Hip flexion 3- 3-  Hip extension    Hip abduction 3 3  Hip adduction 3 3  Hip internal rotation    Hip external rotation    Knee flexion 4 4  Knee extension 4 4  Cervical extension strength 15.6 pounds with hand-held dynamometer    (Blank rows = not tested)  LUMBAR SPECIAL TESTS:  08/08/23 Slump test: Negative bilateral pt reporting more hamstring tightness bilaterally  FUNCTIONAL TESTS:  08/08/23 5 times sit to stand: 90 seconds c UE support  GAIT: 08/08/23 Distance walked: clinic distances level surface Assistive device utilized: None Level of assistance: Complete Independence Comments: wide BOS, antalgic gait                                                                                                                                                                                                                  TODAY'S TREATMENT:                                                                        DATE: 09/13/2023:  TherEx:  Nustep Lvl 7 8 mins UE/LE for aerobic exercise and mobility  Green tband rows 2  x 15 bilateral Green tband gh ext 2 x 15 bilateral  Manual Percussive device to bilateral upper traps, Rt lower lumbar musculature for myofascial relaxation.  TODAY'S TREATMENT:                                                                        DATE: 09/10/23:  TherEx:  Nustep: Level 7 x 8 minutes Rows L3 band 2x15; 3 sec hold Shoulder Extension: L3; 2 x 15   Manual:  Percussion device to bilateral upper traps, rhomboid; infraspinatus and teres minor; holds at trigger points with good tolerance today    TODAY'S TREATMENT:                                                                        DATE: 09/03/23:  TherEx:  Nustep: Level 7 x 8 minutes Rows: red TB 2 x 15 holding 3 sec Shoulder Extension: green TB 2 x 15  Door stretch standing and leaning away x 3 holding 10 sec Door stretch: 90 degrees x 5 holding 10 sec   Manual:  Percussion device to bilateral upper traps, Rt side rhomboids: pt with limited tolerance, STM using massage cream and Biofreeeze was performed with mild IASTM. Active trigger point release to pt's bil upper traps as tolerated.       TODAY'S TREATMENT:                                                                        DATE: 08/30/2023:  TherEx:  Nustep Lvl 6 10 mins UE/LE for aerobic exercise and mobility  Tband rows green 20x Tband gh ext green 20x   Manual Percussive device to bilateral upper traps, Rt lower lumbar musculature for myofascial relaxation.    PATIENT EDUCATION:  Education details: HEP, POC Person educated: Patient Education method: Programmer, multimedia, Demonstration, Verbal cues, and Handouts Education comprehension: verbalized understanding, returned demonstration, and verbal cues required  HOME EXERCISE PROGRAM: Access Code: [email or web address] URL: [email or web address] Date: 08/08/2023 Prepared by: Narda Amber  Exercises - Seated Hamstring Stretch  - 2 x daily - 7 x weekly - 3 reps - 20-30 seconds hold - Sit to  Stand with Counter Support  - 2 x daily - 7 x weekly - 2-3 sets - 5 reps - Standing Lumbar Extension at Wall - Forearms  - 1 x daily - 7 x weekly - 3 sets - 10 reps - Gentle Levator Scapulae Stretch  - 2 x daily - 7 x weekly - 3 reps - 10 seconds hold  ASSESSMENT:  CLINICAL IMPRESSION: Pt continued to report benefit from percussive device use but has continued to struggle with moderate to higher severity symptoms in daily activity.   Pt has reported short term improvements in therapy intervention.   OBJECTIVE IMPAIRMENTS: decreased mobility, difficulty walking, decreased ROM, decreased strength, and pain.   ACTIVITY LIMITATIONS: lifting, bending, sitting, standing,  squatting, and sleeping  PARTICIPATION LIMITATIONS: cleaning, laundry, driving, and community activity, occupation, sleeping, transfers, cooking, and ADL's  PERSONAL FACTORS: 3+ comorbidities: see pertinent history  are also affecting patient's functional outcome.   REHAB POTENTIAL: Fair due to multiple areas to address  CLINICAL DECISION MAKING: Evolving/moderate complexity  EVALUATION COMPLEXITY: Moderate   GOALS: Goals reviewed with patient? Yes  SHORT TERM GOALS: (target date for Short term goals are 3 weeks 08/31/23)  1. Patient will demonstrate independent use of home exercise program to maintain progress from in clinic treatments.  Goal status: met 08/28/2023  LONG TERM GOALS: (target dates for all long term goals are 10 weeks  10/19/23 )   1. Patient will demonstrate/report pain at worst less than or equal to 2/10 to facilitate minimal limitation in daily activity secondary to pain symptoms.  Goal status: on going 08/28/2023   2. Patient will demonstrate independent use of home exercise program to facilitate ability to maintain/progress functional gains from skilled physical therapy services.  Goal status:  on going 08/28/2023   3. Patient will demonstrate FOTO outcome > or = 46 % to indicate reduced  disability due to condition.  Goal status:  on going 08/28/2023   4. Patient will improve her 5 time sit to stand to </= 30 seconds with/without UE support.   Goal status:  on going 08/28/2023   5.  Pt  will be able to navigate 5 steps with single hand rail with step over step gait pattern.  Goal status:  on going 08/28/2023   6.  Pt will improve her bilateral cervical rotation to >/= 50 degrees to improved functional mobility.  Goal status:  on going 08/28/2023  7. Pt will be able to tolerate sitting for up to 20 minutes with pain </= 3/10 for improved driving tolerance.  Goal Status:  on going 08/28/2023     PLAN:  PT FREQUENCY: 1-2x/week  PT DURATION: 10 weeks  PLANNED INTERVENTIONS: Therapeutic exercises, Therapeutic activity, Neuro Muscular re-education, Balance training, Gait training, Patient/Family education, Joint mobilization, Stair training, DME instructions, Dry Needling, Electrical stimulation, Cryotherapy, vasopneumatic device, Moist heat, Taping, Traction Ultrasound, Ionotophoresis 4mg /ml Dexamethasone, and aquatic therapy, Manual therapy.  All included unless contraindicated  PLAN FOR NEXT SESSION:  Percussive device, continued mobility gains as tolerated.    Chyrel Masson, PT, DPT, OCS, ATC 09/13/23  8:36 AM

## 2023-09-14 ENCOUNTER — Encounter: Payer: Self-pay | Admitting: Orthopaedic Surgery

## 2023-09-14 ENCOUNTER — Ambulatory Visit (INDEPENDENT_AMBULATORY_CARE_PROVIDER_SITE_OTHER): Payer: BC Managed Care – PPO | Admitting: Orthopaedic Surgery

## 2023-09-14 ENCOUNTER — Ambulatory Visit (INDEPENDENT_AMBULATORY_CARE_PROVIDER_SITE_OTHER): Payer: BC Managed Care – PPO | Admitting: Primary Care

## 2023-09-14 ENCOUNTER — Encounter (INDEPENDENT_AMBULATORY_CARE_PROVIDER_SITE_OTHER): Payer: Self-pay | Admitting: Primary Care

## 2023-09-14 VITALS — BP 115/80 | HR 82 | Resp 16 | Wt 284.9 lb

## 2023-09-14 VITALS — Ht 64.0 in | Wt 284.0 lb

## 2023-09-14 DIAGNOSIS — Z2821 Immunization not carried out because of patient refusal: Secondary | ICD-10-CM

## 2023-09-14 DIAGNOSIS — M48061 Spinal stenosis, lumbar region without neurogenic claudication: Secondary | ICD-10-CM | POA: Diagnosis not present

## 2023-09-14 DIAGNOSIS — I10 Essential (primary) hypertension: Secondary | ICD-10-CM | POA: Diagnosis not present

## 2023-09-14 DIAGNOSIS — F419 Anxiety disorder, unspecified: Secondary | ICD-10-CM | POA: Diagnosis not present

## 2023-09-14 DIAGNOSIS — Z6841 Body Mass Index (BMI) 40.0 and over, adult: Secondary | ICD-10-CM

## 2023-09-14 DIAGNOSIS — Z13220 Encounter for screening for lipoid disorders: Secondary | ICD-10-CM | POA: Diagnosis not present

## 2023-09-14 DIAGNOSIS — Z760 Encounter for issue of repeat prescription: Secondary | ICD-10-CM

## 2023-09-14 DIAGNOSIS — R7303 Prediabetes: Secondary | ICD-10-CM | POA: Diagnosis not present

## 2023-09-14 DIAGNOSIS — D509 Iron deficiency anemia, unspecified: Secondary | ICD-10-CM

## 2023-09-14 DIAGNOSIS — E66813 Obesity, class 3: Secondary | ICD-10-CM

## 2023-09-14 DIAGNOSIS — Z981 Arthrodesis status: Secondary | ICD-10-CM

## 2023-09-14 DIAGNOSIS — F32A Depression, unspecified: Secondary | ICD-10-CM

## 2023-09-14 DIAGNOSIS — Z28310 Unvaccinated for covid-19: Secondary | ICD-10-CM

## 2023-09-14 NOTE — Progress Notes (Signed)
[facility]  Morgan Molina, is a 47 y.o. female  [id number]:[id number]  KVQ:[id number]  DOB - Sep 18, 1976  Chief Complaint  Patient presents with   Hypertension       Subjective:   Morgan Molina is a 47 y.o. female here today for a follow up visit management of HTN.  Patient has No headache, No chest pain, No abdominal pain - No Nausea, No new weakness tingling or numbness, No Cough - shortness of breath. She has a dx of anemia stays cold, fatigue and dizzy. Previously was on wegovy and insurance denied further refills . This caused depression, self image problems and the most hearting part was she was losing weight. Orthopedic had a goal to loose for back surgery now unable to have due to weight . No problems updated.  Allergies  Allergen Reactions   Latex Dermatitis   Lisinopril Cough   Tramadol Nausea And Vomiting    ALL   Adhesive [Tape] Rash    Past Medical History:  Diagnosis Date   Anemia    Anxiety    Arthritis    Depression    Dyspnea    Headache    Hypertension    Muscle spasm    PTSD (post-traumatic stress disorder) 09/28/2019   Syncope     Current Outpatient Medications on File Prior to Visit  Medication Sig Dispense Refill   clonazePAM (KLONOPIN) 1 MG tablet Take 1 tablet (1 mg total) by mouth 2 (two) times daily as needed for anxiety. 120 tablet 1   ibuprofen (ADVIL) 800 MG tablet TAKE 1 TABLET (800 MG TOTAL) BY MOUTH EVERY 8 (EIGHT) HOURS AS NEEDED. FOR PAIN 90 tablet 0   irbesartan-hydrochlorothiazide (AVALIDE) 150-12.5 MG tablet Take 1 tablet by mouth daily. 90 tablet 1   No current facility-administered medications on file prior to visit.    Objective:   Vitals:   09/14/23 0913  BP: 115/80  Pulse: 82  Resp: 16  SpO2: 100%  Weight: 284 lb 14.4 oz (129.2 kg)    Comprehensive ROS Pertinent positive and negative noted in HPI   Exam General appearance : Awake, alert, not in any distress. Morbid obese,  Speech Clear. Not toxic  looking HEENT: Atraumatic and Normocephalic, pupils equally reactive to light and accomodation Neck: Supple, no JVD. No cervical lymphadenopathy.  Chest: Good air entry bilaterally, no added sounds  CVS: S1 S2 regular, no murmurs.  Abdomen: Bowel sounds present, Non tender and not distended with no gaurding, rigidity or rebound. Extremities: B/L Lower Ext shows no edema, both legs are warm to touch Neurology: Awake alert, and oriented X 3, CN II-XII intact, Non focal Skin: No Rash  Data Review Lab Results  Component Value Date   HGBA1C 5.9 07/12/2022   HGBA1C 5.8 (A) 01/25/2022   HGBA1C 5.9 (H) 06/16/2021    Assessment & Plan  Morgan Molina was seen today for hypertension.  Diagnoses and all orders for this visit:  Influenza vaccination declined  Tetanus, diphtheria, and acellular pertussis (Tdap) vaccination declined  COVID-19 vaccine series declined  Essential hypertension Well controlled BP goal -  medications are helping to get to goal and maintain normal blood pressure. DIET: Limit salt intake, read nutrition labels to check salt content, limit fried and high fatty foods  Avoid using multisymptom OTC cold preparations that generally contain sudafed which can rise BP. Consult with pharmacist on best cold relief products to use for persons with HTN EXERCISE Discussed incorporating exercise such as walking - 30 minutes most days  of the week and can do in 10 minute intervals      Morbidly obese (HCC) 2/2 Anxiety and depression See HPI Patient have been counseled extensively about nutrition and exercise. Other issues discussed during this visit include: low cholesterol diet, weight control and daily exercise, foot care, annual eye examinations at Ophthalmology, importance of adherence with medications and regular follow-up. We also discussed long term complications of uncontrolled diabetes and hypertension.    Iron deficiency anemia, unspecified iron deficiency anemia type -      CBC with Differential  Lipid screening -     Lipid Panel  Prediabetes -     Hemoglobin A1c      The patient was given clear instructions to go to ER or return to medical center if symptoms don't improve, worsen or new problems develop. The patient verbalized understanding. The patient was told to call to get lab results if they haven't heard anything in the next week.   This note has been created with Education officer, environmental. Any transcriptional errors are unintentional.   Grayce Sessions, NP 09/14/2023, 9:52 AM

## 2023-09-14 NOTE — Progress Notes (Signed)
Office Visit Note   Patient: Morgan Molina           Date of Birth: 12/16/1975           MRN: [id number] Visit Date: 09/14/2023              Requested by: Grayce Sessions, NP [address] [place],  [place] [address] PCP: Grayce Sessions, NP   Assessment & Plan: Visit Diagnoses:  1. Status post cervical spinal fusion   2. Spinal stenosis of lumbar region without neurogenic claudication     Plan: When she finishes therapy she will call if she is having ongoing problems we can obtain an updated MRI scan.  She is looking into [facility] or similar medications to help with weight loss to unload her back she is already lost 10 pounds.  Follow-Up Instructions: Return in about 2 months (around 11/14/2023).   Orders:  No orders of the defined types were placed in this encounter.  No orders of the defined types were placed in this encounter.     Procedures: No procedures performed   Clinical Data: No additional findings.   Subjective: Chief Complaint  Patient presents with   Neck - Pain, Follow-up   Lower Back - Pain, Follow-up    HPI 47 year old female returns she is still out of work she had cervical spinal stenosis or level cervical fusion which is solid.  Still has problems with ongoing back pain.  She is still in physical therapy missed couple therapy visits but is working hard on attending therapy and she is lost 10 pounds.  She has noted transition to aquatic therapy starting next week.  No associated bowel or bladder symptoms.  She has pain with the standing as well as prolonged standing.  She got relief for the first epidural but the a second lumbar epidural was not effective at all.  Last lumbar MRI listed below in 2022.  Review of Systems all other systems updated unchanged from 07/13/2023.   Objective: Vital Signs: Ht 5\' 4"  (1.626 m)   Wt 284 lb (128.8 kg)   BMI 48.75 kg/m   Physical Exam Constitutional:      Appearance: She is well-developed.   HENT:     Head: Normocephalic.     Right Ear: External ear normal.     Left Ear: External ear normal. There is no impacted cerumen.  Eyes:     Pupils: Pupils are equal, round, and reactive to light.  Neck:     Thyroid: No thyromegaly.     Trachea: No tracheal deviation.  Cardiovascular:     Rate and Rhythm: Normal rate.  Pulmonary:     Effort: Pulmonary effort is normal.  Abdominal:     Palpations: Abdomen is soft.  Musculoskeletal:     Cervical back: No rigidity.  Skin:    General: Skin is warm and dry.  Neurological:     Mental Status: She is alert and oriented to person, place, and time.  Psychiatric:        Behavior: Behavior normal.     Ortho Exam no lower extremity clonus.  Decreased cervical rotation.  She added that she picked up 7 more degrees rotation right and left with physical therapy.  Pain with straight leg raising.  Symmetrical ankle jerk right and left.  Anterior tib gastrocsoleus is active.  Normal heel-toe gait.  Specialty Comments:  MRI LUMBAR SPINE WITHOUT CONTRAST   TECHNIQUE: Multiplanar, multisequence MR imaging of  the lumbar spine was performed. No intravenous contrast was administered.   COMPARISON:  Prior radiograph from 08/04/2021   FINDINGS: Segmentation: Standard. Lowest well-formed disc space labeled the L5-S1 level.   Alignment: Trace degenerative anterolisthesis of L3 on L4 and L4 on L5. Alignment otherwise normal preservation of the normal lumbar lordosis.   Vertebrae: Vertebral body height maintained without acute or chronic fracture. Bone marrow signal intensity diffusely decreased on T1 weighted imaging, nonspecific, but most commonly related to anemia, smoking, or obesity. Mild reactive endplate change present about the L2-3 interspace. Reactive marrow edema present about the right greater than left L4-5 facets due to facet arthritis. No other abnormal marrow edema.   Conus medullaris and cauda equina: Conus extends to the  L1-2 level. Conus and cauda equina appear normal.   Paraspinal and other soft tissues: Paraspinous soft tissues demonstrate no acute finding. Visualized visceral structures unremarkable.   Disc levels:   T11-12: Seen only on sagittal projection. Mild disc bulge with disc desiccation. Bilateral facet hypertrophy. No significant spinal stenosis. Moderate bilateral foraminal narrowing.   T12-L1: Normal interspace. Mild bilateral facet hypertrophy. No stenosis.   L1-2: Negative interspace. Mild bilateral facet hypertrophy. No stenosis.   L2-3: Degenerative intervertebral disc space narrowing with diffuse disc bulge and disc desiccation. Superimposed biforaminal disc protrusions, right slightly larger than left (series 6, image 15). Superimposed small left subarticular component noted on the left (series 6, image 16). Moderate bilateral facet hypertrophy. Mild prominence of the dorsal epidural fat. Resultant moderate spinal stenosis. Moderate bilateral L2 foraminal narrowing.   L3-4: Trace listhesis. Diffuse disc bulge with disc desiccation. Superimposed right foraminal disc protrusion contacts the exiting right L3 nerve root (series 6, image 19). Moderate bilateral facet hypertrophy. Resultant mild spinal stenosis. Mild left with moderate right L3 foraminal narrowing.   L4-5: Right foraminal to extraforaminal disc protrusion contacts the exiting right L4 nerve root as it courses of the right neural foramen (series 6, image 24). Severe right worse than left facet arthrosis. No significant spinal stenosis. Mild left with moderate right L4 foraminal narrowing.   L5-S1: Negative interspace. Severe left with moderate right facet arthrosis. No significant spinal stenosis. Foramina remain patent.   IMPRESSION: 1. Multifactorial degenerative changes at L2-3 with resultant moderate canal and bilateral L2 foraminal stenosis. 2. Right foraminal to extraforaminal disc protrusions at L3-4  and L4-5, contacting and potentially affecting the exiting right L3 and L4 nerve roots respectively. 3. Moderate to severe lower lumbar facet arthrosis, most pronounced at L4-5 on the right and L5-S1 on the left. Findings could contribute to lower back pain.     Electronically Signed   By: Rise Mu M.D.   On: 10/03/2021 01:47  Imaging: No results found.   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   Arthralgia 03/30/2021   Hyperglycemia 03/30/2021   Shortness of breath 03/30/2021   Bilateral chronic serous otitis media 08/15/2020   Eustachian tube dysfunction, bilateral 08/15/2020   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   Ganglion cyst of dorsum of left wrist 10/01/2019   Decreased visual acuity 10/01/2019   PTSD (post-traumatic stress disorder) 09/28/2019  Nipple discharge 04/02/2019   Panic attacks 02/19/2019   Bilateral low back pain 02/19/2019   Seasonal allergies 02/19/2019   Anxiety and depression 01/14/2019   Syncope 01/14/2019   Breast mass, right 01/14/2019   Hypertension    Past Medical History:  Diagnosis Date   Anemia    Anxiety    Arthritis    Depression    Dyspnea    Headache    Hypertension    Muscle spasm    PTSD (post-traumatic stress disorder) 09/28/2019   Syncope     Family History  Problem Relation Age of Onset   Cancer Mother    Hypertension Mother    Heart attack Mother    Breast cancer Mother 8   CAD Father    Hypertension Father    Diabetes Father     Past Surgical History:  Procedure Laterality Date   ANTERIOR CERVICAL DECOMP/DISCECTOMY FUSION N/A  10/04/2022   Procedure: CERVICAL THREE-FOUR, CERVICAL FOUR-FIVE, CERVICAL FIVE-SIX, CERVICAL SIX-SEVEN, ANTERIOR CERVICAL DISCECTOMY FUSION, ALLOGRAFT, PLATE;  Surgeon: Eldred Manges, MD;  Location: [facility];  Service: Orthopedics;  Laterality: N/A;   BREAST EXCISIONAL BIOPSY Right    CARPAL TUNNEL RELEASE Left 02/20/2020   Procedure: LEFT CARPAL TUNNEL RELEASE;  Surgeon: Betha Loa, MD;  Location: [facility];  Service: Orthopedics;  Laterality: Left;   CARPAL TUNNEL RELEASE Right 02/28/2021   Procedure: CARPAL TUNNEL RELEASE;  Surgeon: Betha Loa, MD;  Location: [facility];  Service: Orthopedics;  Laterality: Right;   CHOLECYSTECTOMY     GANGLION CYST EXCISION Left 02/20/2020   Procedure: LEFT DORSAL GANGLION EXCISION;  Surgeon: Betha Loa, MD;  Location: [facility];  Service: Orthopedics;  Laterality: Left;   TRIGGER FINGER RELEASE Bilateral 08/29/2021   Procedure: RELEASE TRIGGER FINGER/A-1 PULLEY BILATERAL THUMBS;  Surgeon: Betha Loa, MD;  Location: [facility];  Service: Orthopedics;  Laterality: Bilateral;  30 MIN   TUBAL LIGATION     Social History   Occupational History   Occupation: Clinical biochemist  Tobacco Use   Smoking status: Former    Current packs/day: 0.00    Types: Cigarettes    Quit date: 10/26/2019    Years since quitting: 3.8   Smokeless tobacco: Never  Vaping Use   Vaping status: Never Used  Substance and Sexual Activity   Alcohol use: Yes    Comment: occasionally   Drug use: Yes    Types: Marijuana    Comment: occasionally   Sexual activity: Yes    Birth control/protection: Surgical

## 2023-09-15 LAB — LIPID PANEL
Chol/HDL Ratio: 2.6 ratio (ref 0.0–4.4)
Cholesterol, Total: 178 mg/dL (ref 100–199)
HDL: 69 mg/dL (ref >39)
LDL Chol Calc (NIH): 89 mg/dL (ref 0–99)
Triglycerides: 111 mg/dL (ref 0–149)
VLDL Cholesterol Cal: 20 mg/dL (ref 5–40)

## 2023-09-15 LAB — CMP14+EGFR
ALT: 9 [IU]/L (ref 0–32)
AST: 13 [IU]/L (ref 0–40)
Albumin: 4.3 g/dL (ref 3.9–4.9)
Alkaline Phosphatase: 55 [IU]/L (ref 44–121)
BUN/Creatinine Ratio: 13 (ref 9–23)
BUN: 9 mg/dL (ref 6–24)
Bilirubin Total: 0.4 mg/dL (ref 0.0–1.2)
CO2: 20 mmol/L (ref 20–29)
Calcium: 9.8 mg/dL (ref 8.7–10.2)
Chloride: 101 mmol/L (ref 96–106)
Creatinine, Ser: 0.71 mg/dL (ref 0.57–1.00)
Globulin, Total: 2.7 g/dL (ref 1.5–4.5)
Glucose: 87 mg/dL (ref 70–99)
Potassium: 4.2 mmol/L (ref 3.5–5.2)
Sodium: 141 mmol/L (ref 134–144)
Total Protein: 7 g/dL (ref 6.0–8.5)
eGFR: 105 mL/min/{1.73_m2} (ref >59)

## 2023-09-15 LAB — CBC WITH DIFFERENTIAL/PLATELET
Basophils Absolute: 0 10*3/uL (ref 0.0–0.2)
Basos: 1 %
EOS (ABSOLUTE): 0.1 10*3/uL (ref 0.0–0.4)
Eos: 2 %
Hematocrit: 36.2 % (ref 34.0–46.6)
Hemoglobin: 10.9 g/dL — ABNORMAL LOW (ref 11.1–15.9)
Immature Grans (Abs): 0 10*3/uL (ref 0.0–0.1)
Immature Granulocytes: 0 %
Lymphocytes Absolute: 2.5 10*3/uL (ref 0.7–3.1)
Lymphs: 39 %
MCH: 24.9 pg — ABNORMAL LOW (ref 26.6–33.0)
MCHC: 30.1 g/dL — ABNORMAL LOW (ref 31.5–35.7)
MCV: 83 fL (ref 79–97)
Monocytes Absolute: 0.6 10*3/uL (ref 0.1–0.9)
Monocytes: 10 %
Neutrophils Absolute: 3.1 10*3/uL (ref 1.4–7.0)
Neutrophils: 48 %
Platelets: 389 10*3/uL (ref 150–450)
RBC: 4.37 x10E6/uL (ref 3.77–5.28)
RDW: 17.1 % — ABNORMAL HIGH (ref 11.7–15.4)
WBC: 6.4 10*3/uL (ref 3.4–10.8)

## 2023-09-15 LAB — HEMOGLOBIN A1C
Est. average glucose Bld gHb Est-mCnc: 117 mg/dL
Hgb A1c MFr Bld: 5.7 % — ABNORMAL HIGH (ref 4.8–5.6)

## 2023-09-17 ENCOUNTER — Telehealth: Payer: Self-pay | Admitting: Radiology

## 2023-09-17 NOTE — Telephone Encounter (Signed)
Received paperwork to update claim for patient's disability from work. On office visit 07/13/2023, you had kept her out of work x 2 months while she attends PT, etc.  Per your note from 09/14/2023 visit, patient is to continue PT and once completed, if continued symptoms, is supposed to call us to get updated scan.  Do you want her to continue out of work until follow up in two months? We did not give a work note at last visit.  Please advise.

## 2023-09-18 ENCOUNTER — Ambulatory Visit (INDEPENDENT_AMBULATORY_CARE_PROVIDER_SITE_OTHER): Payer: BC Managed Care – PPO | Admitting: Rehabilitative and Restorative Service Providers"

## 2023-09-18 ENCOUNTER — Encounter: Payer: Self-pay | Admitting: Rehabilitative and Restorative Service Providers"

## 2023-09-18 DIAGNOSIS — M542 Cervicalgia: Secondary | ICD-10-CM

## 2023-09-18 DIAGNOSIS — M5459 Other low back pain: Secondary | ICD-10-CM | POA: Diagnosis not present

## 2023-09-18 DIAGNOSIS — M6281 Muscle weakness (generalized): Secondary | ICD-10-CM

## 2023-09-18 DIAGNOSIS — R262 Difficulty in walking, not elsewhere classified: Secondary | ICD-10-CM

## 2023-09-18 DIAGNOSIS — M546 Pain in thoracic spine: Secondary | ICD-10-CM

## 2023-09-18 NOTE — Telephone Encounter (Signed)
Work note entered, form completed and faxed.

## 2023-09-18 NOTE — Therapy (Signed)
OUTPATIENT PHYSICAL THERAPY  TREATMENT   Patient Name: Morgan Molina MRN: [id number] DOB:01/08/1976, 47 y.o., female Today's Date: 09/18/2023  END OF SESSION:  PT End of Session - 09/18/23 0827     Visit Number 9    Number of Visits 20    Date for PT Re-Evaluation 10/19/23    Progress Note Due on Visit 10    PT Start Time 0800    PT Stop Time 0840    PT Time Calculation (min) 40 min    Activity Tolerance Patient tolerated treatment well    Behavior During Therapy [facility] for tasks assessed/performed                     Past Medical History:  Diagnosis Date   Anemia    Anxiety    Arthritis    Depression    Dyspnea    Headache    Hypertension    Muscle spasm    PTSD (post-traumatic stress disorder) 09/28/2019   Syncope    Past Surgical History:  Procedure Laterality Date   ANTERIOR CERVICAL DECOMP/DISCECTOMY FUSION N/A 10/04/2022   Procedure: CERVICAL THREE-FOUR, CERVICAL FOUR-FIVE, CERVICAL FIVE-SIX, CERVICAL SIX-SEVEN, ANTERIOR CERVICAL DISCECTOMY FUSION, ALLOGRAFT, PLATE;  Surgeon: Eldred Manges, MD;  Location: [facility];  Service: Orthopedics;  Laterality: N/A;   BREAST EXCISIONAL BIOPSY Right    CARPAL TUNNEL RELEASE Left 02/20/2020   Procedure: LEFT CARPAL TUNNEL RELEASE;  Surgeon: Betha Loa, MD;  Location: [facility];  Service: Orthopedics;  Laterality: Left;   CARPAL TUNNEL RELEASE Right 02/28/2021   Procedure: CARPAL TUNNEL RELEASE;  Surgeon: Betha Loa, MD;  Location: [facility];  Service: Orthopedics;  Laterality: Right;   CHOLECYSTECTOMY     GANGLION CYST EXCISION Left 02/20/2020   Procedure: LEFT DORSAL GANGLION EXCISION;  Surgeon: Betha Loa, MD;  Location: [facility];  Service: Orthopedics;  Laterality: Left;   TRIGGER FINGER RELEASE Bilateral 08/29/2021   Procedure: RELEASE TRIGGER FINGER/A-1 PULLEY BILATERAL THUMBS;  Surgeon: Betha Loa, MD;  Location: [facility];  Service:  Orthopedics;  Laterality: Bilateral;  30 MIN   TUBAL LIGATION     Patient Active Problem List   Diagnosis Date Noted   Status post cervical spinal fusion 07/13/2023   Spinal stenosis of lumbar region 07/13/2023   Cervical spinal stenosis 10/04/2022   Chest pain 03/17/2022   Unstable angina (HCC) 03/15/2022   Paresthesias 06/23/2021   Neck pain 06/23/2021   Alteration consciousness 06/23/2021   Anxiety 06/23/2021   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   Arthralgia 03/30/2021   Hyperglycemia 03/30/2021   Shortness of breath 03/30/2021   Bilateral chronic serous otitis media 08/15/2020   Eustachian tube dysfunction, bilateral 08/15/2020   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   Ganglion cyst of dorsum of left wrist 10/01/2019   Decreased visual acuity 10/01/2019   PTSD (post-traumatic stress disorder) 09/28/2019   Nipple discharge 04/02/2019   Panic attacks 02/19/2019   Bilateral low back pain 02/19/2019   Seasonal allergies 02/19/2019   Anxiety and depression 01/14/2019   Syncope 01/14/2019   Breast mass, right 01/14/2019   Hypertension     PCP: Grayce Sessions, NP   REFERRING PROVIDER: Eldred Manges, MD   REFERRING DIAG:  Diagnosis  M54.42,M54.41,G89.29 (ICD-10-CM) - Chronic bilateral low back pain with bilateral sciatica    Rationale for Evaluation and Treatment: Rehabilitation  THERAPY DIAG:  Other low back pain  Pain in thoracic spine  Cervicalgia  Difficulty in walking, not elsewhere classified  Muscle weakness (generalized)  ONSET DATE: over a year  SUBJECTIVE:                                                                                                                                                                                           SUBJECTIVE STATEMENT: Pt indicated having achy complications from progression of life complications.  Pt indicated she had to cancel water therapy due to medical concerns but plans to do in future.  Does  feel like she is getting some improvements.   PERTINENT HISTORY:  4 level cervical fusion November 2023, recent spinal epidural, anxiety, PTSD, carpal tunnel, unstable angina, depression, arthritis, dyspnea, HA, muscle spasm  PAIN:  NPRS scale:  7/10 Pain location: low back and neck Pain description: achy, throbbing Aggravating factors: "basic activities" prolonged standing, house activity  Relieving factors: nothing seems to help  PRECAUTIONS: None  WEIGHT BEARING RESTRICTIONS: No  FALLS:  Has patient fallen in last 6 months? No  LIVING ENVIRONMENT: Lives with: lives with their family and lives with their spouse Lives in: House/apartment Stairs: Yes: External: 5 steps; on left going up Has following equipment at home: None  OCCUPATION: not currently working  PLOF: Independent  PATIENT GOALS: Be able to move better, be able to cook a full meal. Be able to perform ADL's and household chores. Be able to drive more than 10 minutes at a time.     OBJECTIVE:   DIAGNOSTIC FINDINGS:  MRI scan showed moderate stenosis L2-3 mild to moderate at 3 4 facet arthropathy bilateral at L2 bilateral L5-S1 and worse on the right at L4-5.  IMPRESSION: 1. Multifactorial degenerative changes at L2-3 with resultant moderate canal and bilateral L2 foraminal stenosis. 2. Right foraminal to extraforaminal disc protrusions at L3-4 and L4-5, contacting and potentially affecting the exiting right L3 and L4 nerve roots respectively. 3. Moderate to severe lower lumbar facet arthrosis, most pronounced at L4-5 on the right and L5-S1 on the left. Findings could contribute to lower back pain.  PATIENT SURVEYS:  09/11/23: FOTO: 32 08/08/23: FOTO eval:  25%   SCREENING FOR RED FLAGS: 08/08/23 Bowel or bladder incontinence: No Cauda equina syndrome: No  COGNITION: 08/08/23 Overall cognitive status: WFL normal      SENSATION: 08/08/23 WFL  POSTURE:  08/08/23 rounded shoulders and forward head,  increased lumbar lordosis  PALPATION: 08/08/23 TTP: cervical, throacic and lumbar paraspinals, bil upper traps, bil QL, bil iliosacral joint line  ROM:   LUMBAR AROM 08/08/23 08/28/2023  Flexion Finger tips to top of knees   Extension 0, unable 25% WFL,  moved to 50 % c repeated x 5 in standing.   End range pain noted  Right lateral flexion 16   Left lateral flexion 15   Right rotation Limited 80%   Left rotation limited 80%    (Blank rows = not tested)  CERVICAL AROM 08/08/23 08/28/2023  Flexion 10 22  Extension 12 25  Right lateral flexion 14   Left lateral flexion 11   Right rotation 25 35  Left rotation       32          45    LOWER EXTREMITY ROM:       Right 08/08/23 Left 08/08/23  Hip flexion 80 76  Hip extension    Hip abduction    Hip adduction    Hip internal rotation    Hip external rotation    Knee flexion    Knee extension     (Blank rows = not tested)  LOWER EXTREMITY MMT:    MMT Right 08/08/23 Left 08/08/23  Hip flexion 3- 3-  Hip extension    Hip abduction 3 3  Hip adduction 3 3  Hip internal rotation    Hip external rotation    Knee flexion 4 4  Knee extension 4 4  Cervical extension strength 15.6 pounds with hand-held dynamometer    (Blank rows = not tested)  LUMBAR SPECIAL TESTS:  08/08/23 Slump test: Negative bilateral pt reporting more hamstring tightness bilaterally  FUNCTIONAL TESTS:  08/08/23 5 times sit to stand: 90 seconds c UE support  GAIT: 08/08/23 Distance walked: clinic distances level surface Assistive device utilized: None Level of assistance: Complete Independence Comments: wide BOS, antalgic gait                                                                                                                                                                                                                  TODAY'S TREATMENT:                                                                        DATE: 09/18/2023:  TherEx:   Nustep Lvl 6 UE/LE 10 mins   Leg press double leg 100 lbs x 15, single leg x 15  56 lbs performed bilaterally  Seated green band bilateral shoudler ER c scap retraction 2 x 15 Seated multifidi isometric c UE lift into table 10 sec hold x 8 Seated press down UE into pball ab contraction 10 sec hold x 8  Manual Percussive device to bilateral upper traps, Rt lower lumbar musculature for myofascial relaxation.   TODAY'S TREATMENT:                                                                        DATE: 09/13/2023:  TherEx:  Nustep Lvl 7 8 mins UE/LE for aerobic exercise and mobility  Green tband rows 2 x 15 bilateral Green tband gh ext 2 x 15 bilateral  Manual Percussive device to bilateral upper traps, Rt lower lumbar musculature for myofascial relaxation.   TODAY'S TREATMENT:                                                                        DATE: 09/10/23:  TherEx:  Nustep: Level 7 x 8 minutes Rows L3 band 2x15; 3 sec hold Shoulder Extension: L3; 2 x 15   Manual:  Percussion device to bilateral upper traps, rhomboid; infraspinatus and teres minor; holds at trigger points with good tolerance today    TODAY'S TREATMENT:                                                                        DATE: 09/03/23:  TherEx:  Nustep: Level 7 x 8 minutes Rows: red TB 2 x 15 holding 3 sec Shoulder Extension: green TB 2 x 15  Door stretch standing and leaning away x 3 holding 10 sec Door stretch: 90 degrees x 5 holding 10 sec   Manual:  Percussion device to bilateral upper traps, Rt side rhomboids: pt with limited tolerance, STM using massage cream and Biofreeeze was performed with mild IASTM. Active trigger point release to pt's bil upper traps as tolerated.       TODAY'S TREATMENT:                                                                        DATE: 08/30/2023:  TherEx:  Nustep Lvl 6 10 mins UE/LE for aerobic exercise and mobility  Tband rows green 20x Tband gh ext  green 20x   Manual Percussive device to bilateral upper traps, Rt lower lumbar musculature for myofascial relaxation.    PATIENT EDUCATION:  Education details: HEP, POC Person educated: Patient Education method: Programmer, multimedia, Facilities manager, Verbal  cues, and Handouts Education comprehension: verbalized understanding, returned demonstration, and verbal cues required  HOME EXERCISE PROGRAM: Access Code: [email or web address] URL: [email or web address] Date: 08/08/2023 Prepared by: Narda Amber  Exercises - Seated Hamstring Stretch  - 2 x daily - 7 x weekly - 3 reps - 20-30 seconds hold - Sit to Stand with Counter Support  - 2 x daily - 7 x weekly - 2-3 sets - 5 reps - Standing Lumbar Extension at Wall - Forearms  - 1 x daily - 7 x weekly - 3 sets - 10 reps - Gentle Levator Scapulae Stretch  - 2 x daily - 7 x weekly - 3 reps - 10 seconds hold  ASSESSMENT:  CLINICAL IMPRESSION: Improving tolerance to physical activity tasks in clinic despite continued higher severity of pain levels reported by number.   OBJECTIVE IMPAIRMENTS: decreased mobility, difficulty walking, decreased ROM, decreased strength, and pain.   ACTIVITY LIMITATIONS: lifting, bending, sitting, standing, squatting, and sleeping  PARTICIPATION LIMITATIONS: cleaning, laundry, driving, and community activity, occupation, sleeping, transfers, cooking, and ADL's  PERSONAL FACTORS: 3+ comorbidities: see pertinent history  are also affecting patient's functional outcome.   REHAB POTENTIAL: Fair due to multiple areas to address  CLINICAL DECISION MAKING: Evolving/moderate complexity  EVALUATION COMPLEXITY: Moderate   GOALS: Goals reviewed with patient? Yes  SHORT TERM GOALS: (target date for Short term goals are 3 weeks 08/31/23)  1. Patient will demonstrate independent use of home exercise program to maintain progress from in clinic treatments.  Goal status: met 08/28/2023  LONG TERM GOALS: (target dates for  all long term goals are 10 weeks  10/19/23 )   1. Patient will demonstrate/report pain at worst less than or equal to 2/10 to facilitate minimal limitation in daily activity secondary to pain symptoms.  Goal status: on going 08/28/2023   2. Patient will demonstrate independent use of home exercise program to facilitate ability to maintain/progress functional gains from skilled physical therapy services.  Goal status:  on going 08/28/2023   3. Patient will demonstrate FOTO outcome > or = 46 % to indicate reduced disability due to condition.  Goal status:  on going 08/28/2023   4. Patient will improve her 5 time sit to stand to </= 30 seconds with/without UE support.   Goal status:  on going 08/28/2023   5.  Pt  will be able to navigate 5 steps with single hand rail with step over step gait pattern.  Goal status:  on going 08/28/2023   6.  Pt will improve her bilateral cervical rotation to >/= 50 degrees to improved functional mobility.  Goal status:  on going 08/28/2023  7. Pt will be able to tolerate sitting for up to 20 minutes with pain </= 3/10 for improved driving tolerance.  Goal Status:  on going 08/28/2023     PLAN:  PT FREQUENCY: 1-2x/week  PT DURATION: 10 weeks  PLANNED INTERVENTIONS: Therapeutic exercises, Therapeutic activity, Neuro Muscular re-education, Balance training, Gait training, Patient/Family education, Joint mobilization, Stair training, DME instructions, Dry Needling, Electrical stimulation, Cryotherapy, vasopneumatic device, Moist heat, Taping, Traction Ultrasound, Ionotophoresis 4mg /ml Dexamethasone, and aquatic therapy, Manual therapy.  All included unless contraindicated  PLAN FOR NEXT SESSION:  Continue activity tolerance improvements.  Percussive device as desired.    Chyrel Masson, PT, DPT, OCS, ATC 09/18/23  9:03 AM

## 2023-09-20 ENCOUNTER — Encounter: Payer: BC Managed Care – PPO | Admitting: Rehabilitative and Restorative Service Providers"

## 2023-09-21 ENCOUNTER — Telehealth: Payer: Self-pay | Admitting: Orthopaedic Surgery

## 2023-09-21 ENCOUNTER — Ambulatory Visit: Payer: BC Managed Care – PPO | Admitting: Physical Therapy

## 2023-09-21 NOTE — Telephone Encounter (Signed)
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 If not [name] need to set up a peer to peer. Please fax clarification to [phone].

## 2023-09-21 NOTE — Telephone Encounter (Signed)
I called Morgan Molina and spoke with [name].  She states that they cannot take only a doctor's note for an excuse and that they need measurements sent to then in regards to patient's PT.  I called and spoke with patient who gave me the ok to send the PT note from 08/28/2023 only.  This note only faxed to [phone].

## 2023-09-25 ENCOUNTER — Ambulatory Visit (INDEPENDENT_AMBULATORY_CARE_PROVIDER_SITE_OTHER): Payer: BC Managed Care – PPO | Admitting: Physical Therapy

## 2023-09-25 ENCOUNTER — Other Ambulatory Visit: Payer: Self-pay | Admitting: Radiology

## 2023-09-25 ENCOUNTER — Encounter: Payer: Self-pay | Admitting: Physical Therapy

## 2023-09-25 DIAGNOSIS — M542 Cervicalgia: Secondary | ICD-10-CM | POA: Diagnosis not present

## 2023-09-25 DIAGNOSIS — R262 Difficulty in walking, not elsewhere classified: Secondary | ICD-10-CM | POA: Diagnosis not present

## 2023-09-25 DIAGNOSIS — M546 Pain in thoracic spine: Secondary | ICD-10-CM

## 2023-09-25 DIAGNOSIS — M48061 Spinal stenosis, lumbar region without neurogenic claudication: Secondary | ICD-10-CM

## 2023-09-25 DIAGNOSIS — M5459 Other low back pain: Secondary | ICD-10-CM

## 2023-09-25 DIAGNOSIS — M6281 Muscle weakness (generalized): Secondary | ICD-10-CM

## 2023-09-25 NOTE — Therapy (Signed)
OUTPATIENT PHYSICAL THERAPY  TREATMENT PROGRESS NOTE   Patient Name: Morgan Molina MRN: [id number] DOB:January 10, 1976, 47 y.o., female Today's Date: 09/25/2023 Progress Note Reporting Period 08/08/23 to 09/25/23  See note below for Objective Data and Assessment of Progress/Goals.      END OF SESSION:  PT End of Session - 09/25/23 0815     Visit Number 10    Number of Visits 20    Date for PT Re-Evaluation 10/19/23    Progress Note Due on Visit 20    PT Start Time 0800    PT Stop Time 0835    PT Time Calculation (min) 35 min    Activity Tolerance Patient tolerated treatment well    Behavior During Therapy [facility] for tasks assessed/performed                     Past Medical History:  Diagnosis Date   Anemia    Anxiety    Arthritis    Depression    Dyspnea    Headache    Hypertension    Muscle spasm    PTSD (post-traumatic stress disorder) 09/28/2019   Syncope    Past Surgical History:  Procedure Laterality Date   ANTERIOR CERVICAL DECOMP/DISCECTOMY FUSION N/A 10/04/2022   Procedure: CERVICAL THREE-FOUR, CERVICAL FOUR-FIVE, CERVICAL FIVE-SIX, CERVICAL SIX-SEVEN, ANTERIOR CERVICAL DISCECTOMY FUSION, ALLOGRAFT, PLATE;  Surgeon: Eldred Manges, MD;  Location: [facility] OR;  Service: Orthopedics;  Laterality: N/A;   BREAST EXCISIONAL BIOPSY Right    CARPAL TUNNEL RELEASE Left 02/20/2020   Procedure: LEFT CARPAL TUNNEL RELEASE;  Surgeon: Betha Loa, MD;  Location: [facility];  Service: Orthopedics;  Laterality: Left;   CARPAL TUNNEL RELEASE Right 02/28/2021   Procedure: CARPAL TUNNEL RELEASE;  Surgeon: Betha Loa, MD;  Location: [facility];  Service: Orthopedics;  Laterality: Right;   CHOLECYSTECTOMY     GANGLION CYST EXCISION Left 02/20/2020   Procedure: LEFT DORSAL GANGLION EXCISION;  Surgeon: Betha Loa, MD;  Location: [facility];  Service: Orthopedics;  Laterality: Left;   TRIGGER FINGER RELEASE Bilateral 08/29/2021    Procedure: RELEASE TRIGGER FINGER/A-1 PULLEY BILATERAL THUMBS;  Surgeon: Betha Loa, MD;  Location: [facility];  Service: Orthopedics;  Laterality: Bilateral;  30 MIN   TUBAL LIGATION     Patient Active Problem List   Diagnosis Date Noted   Status post cervical spinal fusion 07/13/2023   Spinal stenosis of lumbar region 07/13/2023   Cervical spinal stenosis 10/04/2022   Chest pain 03/17/2022   Unstable angina (HCC) 03/15/2022   Paresthesias 06/23/2021   Neck pain 06/23/2021   Alteration consciousness 06/23/2021   Anxiety 06/23/2021   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   Arthralgia 03/30/2021   Hyperglycemia 03/30/2021   Shortness of breath 03/30/2021   Bilateral chronic serous otitis media 08/15/2020   Eustachian tube dysfunction, bilateral 08/15/2020   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   Ganglion cyst of dorsum of left wrist 10/01/2019   Decreased visual acuity 10/01/2019   PTSD (post-traumatic stress disorder) 09/28/2019   Nipple discharge 04/02/2019   Panic attacks 02/19/2019   Bilateral low back pain 02/19/2019   Seasonal allergies 02/19/2019   Anxiety and depression 01/14/2019   Syncope 01/14/2019   Breast mass, right 01/14/2019   Hypertension     PCP: Grayce Sessions, NP   REFERRING PROVIDER: Eldred Manges, MD   REFERRING DIAG:  Diagnosis  M54.42,M54.41,G89.29 (ICD-10-CM) - Chronic bilateral low back pain with bilateral  sciatica    Rationale for Evaluation and Treatment: Rehabilitation  THERAPY DIAG:  Other low back pain  Pain in thoracic spine  Cervicalgia  Difficulty in walking, not elsewhere classified  Muscle weakness (generalized)  ONSET DATE: over a year  SUBJECTIVE:                                                                                                                                                                                           SUBJECTIVE STATEMENT: Pt arriving today in 9/10 pain in her Rt  sided low back. Pt in tears and wishing to try anything to help.   PERTINENT HISTORY:  4 level cervical fusion November 2023, recent spinal epidural, anxiety, PTSD, carpal tunnel, unstable angina, depression, arthritis, dyspnea, HA, muscle spasm  PAIN:  NPRS scale:  9/10 upon arrival, 10/10 at worse Pain location: low back and neck Pain description: achy, throbbing Aggravating factors: "basic activities" prolonged standing, house activity  Relieving factors: nothing seems to help  PRECAUTIONS: None  WEIGHT BEARING RESTRICTIONS: No  FALLS:  Has patient fallen in last 6 months? No  LIVING ENVIRONMENT: Lives with: lives with their family and lives with their spouse Lives in: House/apartment Stairs: Yes: External: 5 steps; on left going up Has following equipment at home: None  OCCUPATION: not currently working  PLOF: Independent  PATIENT GOALS: Be able to move better, be able to cook a full meal. Be able to perform ADL's and household chores. Be able to drive more than 10 minutes at a time.     OBJECTIVE:   DIAGNOSTIC FINDINGS:  MRI scan showed moderate stenosis L2-3 mild to moderate at 3 4 facet arthropathy bilateral at L2 bilateral L5-S1 and worse on the right at L4-5.  IMPRESSION: 1. Multifactorial degenerative changes at L2-3 with resultant moderate canal and bilateral L2 foraminal stenosis. 2. Right foraminal to extraforaminal disc protrusions at L3-4 and L4-5, contacting and potentially affecting the exiting right L3 and L4 nerve roots respectively. 3. Moderate to severe lower lumbar facet arthrosis, most pronounced at L4-5 on the right and L5-S1 on the left. Findings could contribute to lower back pain.  PATIENT SURVEYS:  09/11/23: FOTO: 32 08/08/23: FOTO eval:  25%   SCREENING FOR RED FLAGS: 08/08/23 Bowel or bladder incontinence: No Cauda equina syndrome: No  COGNITION: 08/08/23 Overall cognitive status: WFL  normal      SENSATION: 08/08/23 WFL  POSTURE:  08/08/23 rounded shoulders and forward head, increased lumbar lordosis  PALPATION: 08/08/23 TTP: cervical, throacic and lumbar paraspinals, bil upper traps, bil QL, bil iliosacral joint line  ROM:   LUMBAR AROM 08/08/23 08/28/2023  Flexion  Finger tips to top of knees   Extension 0, unable 25% WFL, moved to 50 % c repeated x 5 in standing.   End range pain noted  Right lateral flexion 16   Left lateral flexion 15   Right rotation Limited 80%   Left rotation limited 80%    (Blank rows = not tested)  CERVICAL AROM 08/08/23 08/28/2023 09/25/23  Flexion 10 22 24   Extension 12 25 25   Right lateral flexion 14    Left lateral flexion 11    Right rotation 25 35 40  Left rotation       32          45         45    LOWER EXTREMITY ROM:       Right 08/08/23 Left 08/08/23  Hip flexion 80 76  Hip extension    Hip abduction    Hip adduction    Hip internal rotation    Hip external rotation    Knee flexion    Knee extension     (Blank rows = not tested)  LOWER EXTREMITY MMT:    MMT Right 08/08/23 Left 08/08/23  Hip flexion 3- 3-  Hip extension    Hip abduction 3 3  Hip adduction 3 3  Hip internal rotation    Hip external rotation    Knee flexion 4 4  Knee extension 4 4  Cervical extension strength 15.6 pounds with hand-held dynamometer    (Blank rows = not tested)  LUMBAR SPECIAL TESTS:  08/08/23 Slump test: Negative bilateral pt reporting more hamstring tightness bilaterally  FUNCTIONAL TESTS:  08/08/23 5 times sit to stand: 90 seconds c UE support  GAIT: 08/08/23 Distance walked: clinic distances level surface Assistive device utilized: None Level of assistance: Complete Independence Comments: wide BOS, antalgic gait                                                                                                                                                                                                                     TODAY'S TREATMENT:  DATE: 09/25/2023:  Modalities:  Pre-modulated E-stim performed to pt's Rt lumbar paraspinals and QL x 30 minutes, moist heat applied but pt unable to tolerate     TODAY'S TREATMENT:                                                                        DATE: 09/18/2023:  TherEx:  Nustep Lvl 6 UE/LE 10 mins   Leg press double leg 100 lbs x 15, single leg x 15  56 lbs performed bilaterally  Seated green band bilateral shoudler ER c scap retraction 2 x 15 Seated multifidi isometric c UE lift into table 10 sec hold x 8 Seated press down UE into pball ab contraction 10 sec hold x 8  Manual Percussive device to bilateral upper traps, Rt lower lumbar musculature for myofascial relaxation.   TODAY'S TREATMENT:                                                                        DATE: 09/13/2023:  TherEx:  Nustep Lvl 7 8 mins UE/LE for aerobic exercise and mobility  Green tband rows 2 x 15 bilateral Green tband gh ext 2 x 15 bilateral  Manual Percussive device to bilateral upper traps, Rt lower lumbar musculature for myofascial relaxation.   TODAY'S TREATMENT:                                                                        DATE: 09/10/23:  TherEx:  Nustep: Level 7 x 8 minutes Rows L3 band 2x15; 3 sec hold Shoulder Extension: L3; 2 x 15   Manual:  Percussion device to bilateral upper traps, rhomboid; infraspinatus and teres minor; holds at trigger points with good tolerance today      PATIENT EDUCATION:  Education details: HEP, POC Person educated: Patient Education method: Programmer, multimedia, Demonstration, Verbal cues, and Handouts Education comprehension: verbalized understanding, returned demonstration, and verbal cues required  HOME EXERCISE PROGRAM: Access Code: [email or web address] URL: [email or web address] Date: 08/08/2023 Prepared by: Narda Amber  Exercises -  Seated Hamstring Stretch  - 2 x daily - 7 x weekly - 3 reps - 20-30 seconds hold - Sit to Stand with Counter Support  - 2 x daily - 7 x weekly - 2-3 sets - 5 reps - Standing Lumbar Extension at Wall - Forearms  - 1 x daily - 7 x weekly - 3 sets - 10 reps - Gentle Levator Scapulae Stretch  - 2 x daily - 7 x weekly - 3 reps - 10 seconds hold  ASSESSMENT:  CLINICAL IMPRESSION: Pt arriving today to PT in 9/10 pain and in tears. Pt stating she was willing to try anything to help but due to increase pain was  only able to tolerate E-stim at low intensity. Pt in pain with all movements. I advised pt to follow up with Dr. Ophelia Charter for further evaluation of her Rt sided low back pain with radiation down Rt LE and into Rt hip.   OBJECTIVE IMPAIRMENTS: decreased mobility, difficulty walking, decreased ROM, decreased strength, and pain.   ACTIVITY LIMITATIONS: lifting, bending, sitting, standing, squatting, and sleeping  PARTICIPATION LIMITATIONS: cleaning, laundry, driving, and community activity, occupation, sleeping, transfers, cooking, and ADL's  PERSONAL FACTORS: 3+ comorbidities: see pertinent history  are also affecting patient's functional outcome.   REHAB POTENTIAL: Fair due to multiple areas to address  CLINICAL DECISION MAKING: Evolving/moderate complexity  EVALUATION COMPLEXITY: Moderate   GOALS: Goals reviewed with patient? Yes  SHORT TERM GOALS: (target date for Short term goals are 3 weeks 08/31/23)  1. Patient will demonstrate independent use of home exercise program to maintain progress from in clinic treatments.  Goal status: met 08/28/2023  LONG TERM GOALS: (target dates for all long term goals are 10 weeks  10/19/23 )   1. Patient will demonstrate/report pain at worst less than or equal to 2/10 to facilitate minimal limitation in daily activity secondary to pain symptoms.  Goal status: on going 08/28/2023   2. Patient will demonstrate independent use of home exercise  program to facilitate ability to maintain/progress functional gains from skilled physical therapy services.  Goal status:  on going 09/25/23   3. Patient will demonstrate FOTO outcome > or = 46 % to indicate reduced disability due to condition.  Goal status:  on going 09/25/23   4. Patient will improve her 5 time sit to stand to </= 30 seconds with/without UE support.   Goal status:  on going 09/25/23   5.  Pt  will be able to navigate 5 steps with single hand rail with step over step gait pattern.  Goal status:  on going 09/25/23   6.  Pt will improve her bilateral cervical rotation to >/= 50 degrees to improved functional mobility.  Goal status:  on going 09/25/23  7. Pt will be able to tolerate sitting for up to 20 minutes with pain </= 3/10 for improved driving tolerance.  Goal Status:  on going 09/25/23     PLAN:  PT FREQUENCY: 1-2x/week  PT DURATION: 10 weeks  PLANNED INTERVENTIONS: Therapeutic exercises, Therapeutic activity, Neuro Muscular re-education, Balance training, Gait training, Patient/Family education, Joint mobilization, Stair training, DME instructions, Dry Needling, Electrical stimulation, Cryotherapy, vasopneumatic device, Moist heat, Taping, Traction Ultrasound, Ionotophoresis 4mg /ml Dexamethasone, and aquatic therapy, Manual therapy.  All included unless contraindicated  PLAN FOR NEXT SESSION:  Continue activity tolerance improvements.  Percussive device and  E-stim as needed/desired   Narda Amber, PT, MPT 09/25/23 8:29 AM   09/25/23  8:29 AM

## 2023-09-27 ENCOUNTER — Encounter: Payer: BC Managed Care – PPO | Admitting: Rehabilitative and Restorative Service Providers"

## 2023-09-28 ENCOUNTER — Ambulatory Visit (INDEPENDENT_AMBULATORY_CARE_PROVIDER_SITE_OTHER): Payer: BC Managed Care – PPO | Admitting: Physical Therapy

## 2023-09-28 ENCOUNTER — Encounter: Payer: Self-pay | Admitting: Physical Therapy

## 2023-09-28 DIAGNOSIS — M546 Pain in thoracic spine: Secondary | ICD-10-CM

## 2023-09-28 DIAGNOSIS — M542 Cervicalgia: Secondary | ICD-10-CM

## 2023-09-28 DIAGNOSIS — R262 Difficulty in walking, not elsewhere classified: Secondary | ICD-10-CM

## 2023-09-28 DIAGNOSIS — M5459 Other low back pain: Secondary | ICD-10-CM

## 2023-09-28 DIAGNOSIS — M6281 Muscle weakness (generalized): Secondary | ICD-10-CM

## 2023-09-28 NOTE — Therapy (Signed)
OUTPATIENT PHYSICAL THERAPY  TREATMENT PROGRESS NOTE   Patient Name: Morgan Molina MRN: [id number] DOB:[birth year], 47 y.o., female Today's Date: 09/28/2023 Progress Note Reporting Period 08/08/23 to 09/25/23  See note below for Objective Data and Assessment of Progress/Goals.      END OF SESSION:  PT End of Session - 09/28/23 0837     Visit Number 11    Number of Visits 20    Date for PT Re-Evaluation 10/19/23    Progress Note Due on Visit 20    PT Start Time 0830    PT Stop Time 0908    PT Time Calculation (min) 38 min    Activity Tolerance Patient tolerated treatment well    Behavior During Therapy [facility] for tasks assessed/performed                     Past Medical History:  Diagnosis Date   Anemia    Anxiety    Arthritis    Depression    Dyspnea    Headache    Hypertension    Muscle spasm    PTSD (post-traumatic stress disorder) 09/28/2019   Syncope    Past Surgical History:  Procedure Laterality Date   ANTERIOR CERVICAL DECOMP/DISCECTOMY FUSION N/A 10/04/2022   Procedure: CERVICAL THREE-FOUR, CERVICAL FOUR-FIVE, CERVICAL FIVE-SIX, CERVICAL SIX-SEVEN, ANTERIOR CERVICAL DISCECTOMY FUSION, ALLOGRAFT, PLATE;  Surgeon: Eldred Manges, MD;  Location: [facility] OR;  Service: Orthopedics;  Laterality: N/A;   BREAST EXCISIONAL BIOPSY Right    CARPAL TUNNEL RELEASE Left 02/20/2020   Procedure: LEFT CARPAL TUNNEL RELEASE;  Surgeon: Betha Loa, MD;  Location: [facility];  Service: Orthopedics;  Laterality: Left;   CARPAL TUNNEL RELEASE Right 02/28/2021   Procedure: CARPAL TUNNEL RELEASE;  Surgeon: Betha Loa, MD;  Location: [facility];  Service: Orthopedics;  Laterality: Right;   CHOLECYSTECTOMY     GANGLION CYST EXCISION Left 02/20/2020   Procedure: LEFT DORSAL GANGLION EXCISION;  Surgeon: Betha Loa, MD;  Location: [facility];  Service: Orthopedics;  Laterality: Left;   TRIGGER FINGER RELEASE Bilateral 08/29/2021    Procedure: RELEASE TRIGGER FINGER/A-1 PULLEY BILATERAL THUMBS;  Surgeon: Betha Loa, MD;  Location:  SURGERY CENTER;  Service: Orthopedics;  Laterality: Bilateral;  30 MIN   TUBAL LIGATION     Patient Active Problem List   Diagnosis Date Noted   Status post cervical spinal fusion 07/13/2023   Spinal stenosis of lumbar region 07/13/2023   Cervical spinal stenosis 10/04/2022   Chest pain 03/17/2022   Unstable angina (HCC) 03/15/2022   Paresthesias 06/23/2021   Neck pain 06/23/2021   Alteration consciousness 06/23/2021   Anxiety 06/23/2021   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   Arthralgia 03/30/2021   Hyperglycemia 03/30/2021   Shortness of breath 03/30/2021   Bilateral chronic serous otitis media 08/15/2020   Eustachian tube dysfunction, bilateral 08/15/2020   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   Ganglion cyst of dorsum of left wrist 10/01/2019   Decreased visual acuity 10/01/2019   PTSD (post-traumatic stress disorder) 09/28/2019   Nipple discharge 04/02/2019   Panic attacks 02/19/2019   Bilateral low back pain 02/19/2019   Seasonal allergies 02/19/2019   Anxiety and depression 01/14/2019   Syncope 01/14/2019   Breast mass, right 01/14/2019   Hypertension     PCP: Grayce Sessions, NP   REFERRING PROVIDER: Eldred Manges, MD   REFERRING DIAG:  Diagnosis  M54.42,M54.41,G89.29 (ICD-10-CM) - Chronic bilateral low back pain with bilateral  sciatica    Rationale for Evaluation and Treatment: Rehabilitation  THERAPY DIAG:  Other low back pain  Pain in thoracic spine  Cervicalgia  Difficulty in walking, not elsewhere classified  Muscle weakness (generalized)  ONSET DATE: over a year  SUBJECTIVE:                                                                                                                                                                                           SUBJECTIVE STATEMENT: Pt arriving today 8/10 pain overall,  better than it was last time  PERTINENT HISTORY:  4 level cervical fusion November 2023, recent spinal epidural, anxiety, PTSD, carpal tunnel, unstable angina, depression, arthritis, dyspnea, HA, muscle spasm  PAIN:  NPRS scale:  9/10 upon arrival, 10/10 at worse Pain location: low back and neck Pain description: achy, throbbing Aggravating factors: "basic activities" prolonged standing, house activity  Relieving factors: nothing seems to help  PRECAUTIONS: None  WEIGHT BEARING RESTRICTIONS: No  FALLS:  Has patient fallen in last 6 months? No  LIVING ENVIRONMENT: Lives with: lives with their family and lives with their spouse Lives in: House/apartment Stairs: Yes: External: 5 steps; on left going up Has following equipment at home: None  OCCUPATION: not currently working  PLOF: Independent  PATIENT GOALS: Be able to move better, be able to cook a full meal. Be able to perform ADL's and household chores. Be able to drive more than 10 minutes at a time.     OBJECTIVE:   DIAGNOSTIC FINDINGS:  MRI scan showed moderate stenosis L2-3 mild to moderate at 3 4 facet arthropathy bilateral at L2 bilateral L5-S1 and worse on the right at L4-5.  IMPRESSION: 1. Multifactorial degenerative changes at L2-3 with resultant moderate canal and bilateral L2 foraminal stenosis. 2. Right foraminal to extraforaminal disc protrusions at L3-4 and L4-5, contacting and potentially affecting the exiting right L3 and L4 nerve roots respectively. 3. Moderate to severe lower lumbar facet arthrosis, most pronounced at L4-5 on the right and L5-S1 on the left. Findings could contribute to lower back pain.  PATIENT SURVEYS:  09/11/23: FOTO: 32 08/08/23: FOTO eval:  25%   SCREENING FOR RED FLAGS: 08/08/23 Bowel or bladder incontinence: No Cauda equina syndrome: No  COGNITION: 08/08/23 Overall cognitive status: WFL normal      SENSATION: 08/08/23 WFL  POSTURE:  08/08/23 rounded shoulders and  forward head, increased lumbar lordosis  PALPATION: 08/08/23 TTP: cervical, throacic and lumbar paraspinals, bil upper traps, bil QL, bil iliosacral joint line  ROM:   LUMBAR AROM 08/08/23 08/28/2023  Flexion Finger tips to top of knees   Extension 0, unable  25% WFL, moved to 50 % c repeated x 5 in standing.   End range pain noted  Right lateral flexion 16   Left lateral flexion 15   Right rotation Limited 80%   Left rotation limited 80%    (Blank rows = not tested)  CERVICAL AROM 08/08/23 08/28/2023 09/25/23  Flexion 10 22 24   Extension 12 25 25   Right lateral flexion 14    Left lateral flexion 11    Right rotation 25 35 40  Left rotation       32          45         45    LOWER EXTREMITY ROM:       Right 08/08/23 Left 08/08/23  Hip flexion 80 76  Hip extension    Hip abduction    Hip adduction    Hip internal rotation    Hip external rotation    Knee flexion    Knee extension     (Blank rows = not tested)  LOWER EXTREMITY MMT:    MMT Right 08/08/23 Left 08/08/23  Hip flexion 3- 3-  Hip extension    Hip abduction 3 3  Hip adduction 3 3  Hip internal rotation    Hip external rotation    Knee flexion 4 4  Knee extension 4 4  Cervical extension strength 15.6 pounds with hand-held dynamometer    (Blank rows = not tested)  LUMBAR SPECIAL TESTS:  08/08/23 Slump test: Negative bilateral pt reporting more hamstring tightness bilaterally  FUNCTIONAL TESTS:  08/08/23 5 times sit to stand: 90 seconds c UE support  GAIT: 08/08/23 Distance walked: clinic distances level surface Assistive device utilized: None Level of assistance: Complete Independence Comments: wide BOS, antalgic gait                                                                                                                                                                                                                    TODAY'S TREATMENT:                                                                          DATE: 09/28/23 Pt seen for aquatic therapy today.  Treatment  took place in water 3.5-4.75 ft in depth at the [facility] pool. Temp of water was 91.  Pt entered/exited the pool via stairs with hand rail.   Pt requires the buoyancy and hydrostatic pressure of water for support, and to offload joints by unweighting joint load by at least 50 % in navel deep water and by at least 75-80% in chest to neck deep water.  Viscosity of the water is needed for resistance of strengthening. Water current perturbations provides challenge to standing balance requiring increased core activation. Aquatic PT exercises Forward walking and backward walking 3 round trips, began with floating bar then progressed to no UE support Sidestepping 4 round trips Tandem walk 2 round trips with floating bar Lumbar flexion L stretch 5 sec X 10 holding at pool wall Leg swings hip abd/add X 10 bilat with UE support with UE support Leg swings hip flexion/extension X 15 bilat Marches X 15 bilat Monster walks 3 round trips Seated on noodle bicycle X 2 min for spinal decompression  DATE: 09/25/2023:  Modalities:  Pre-modulated E-stim performed to pt's Rt lumbar paraspinals and QL x 30 minutes, intensity to pt's tolerance moist heat applied but pt unable to tolerate more than 5 miinutes     TODAY'S TREATMENT:                                                                        DATE: 09/18/2023:  TherEx:  Nustep Lvl 6 UE/LE 10 mins   Leg press double leg 100 lbs x 15, single leg x 15  56 lbs performed bilaterally  Seated green band bilateral shoudler ER c scap retraction 2 x 15 Seated multifidi isometric c UE lift into table 10 sec hold x 8 Seated press down UE into pball ab contraction 10 sec hold x 8  Manual Percussive device to bilateral upper traps, Rt lower lumbar musculature for myofascial relaxation.   TODAY'S TREATMENT:                                                                         DATE: 09/13/2023:  TherEx:  Nustep Lvl 7 8 mins UE/LE for aerobic exercise and mobility  Green tband rows 2 x 15 bilateral Green tband gh ext 2 x 15 bilateral  Manual Percussive device to bilateral upper traps, Rt lower lumbar musculature for myofascial relaxation.   TODAY'S TREATMENT:                                                                        DATE: 09/10/23:  TherEx:  Nustep: Level 7 x 8 minutes Rows L3 band 2x15; 3 sec hold Shoulder Extension: L3; 2 x 15   Manual:  Percussion device  to bilateral upper traps, rhomboid; infraspinatus and teres minor; holds at trigger points with good tolerance today      PATIENT EDUCATION:  Education details: HEP, POC Person educated: Patient Education method: Explanation, Demonstration, Verbal cues, and Handouts Education comprehension: verbalized understanding, returned demonstration, and verbal cues required  HOME EXERCISE PROGRAM: Access Code: [email or web address] URL: [email or web address] Date: 08/08/2023 Prepared by: Narda Amber  Exercises - Seated Hamstring Stretch  - 2 x daily - 7 x weekly - 3 reps - 20-30 seconds hold - Sit to Stand with Counter Support  - 2 x daily - 7 x weekly - 2-3 sets - 5 reps - Standing Lumbar Extension at Wall - Forearms  - 1 x daily - 7 x weekly - 3 sets - 10 reps - Gentle Levator Scapulae Stretch  - 2 x daily - 7 x weekly - 3 reps - 10 seconds hold  ASSESSMENT:  CLINICAL IMPRESSION: She had her first aquatic PT session and this overall went well. She appears comfortable with the water and reports she likes how unweighted she feels to improve exercise tolerance. We will monitor her soreness and longer term response to the aquatic PT and adjust PRN.  OBJECTIVE IMPAIRMENTS: decreased mobility, difficulty walking, decreased ROM, decreased strength, and pain.   ACTIVITY LIMITATIONS: lifting, bending, sitting, standing, squatting, and sleeping  PARTICIPATION LIMITATIONS: cleaning,  laundry, driving, and community activity, occupation, sleeping, transfers, cooking, and ADL's  PERSONAL FACTORS: 3+ comorbidities: see pertinent history  are also affecting patient's functional outcome.   REHAB POTENTIAL: Fair due to multiple areas to address  CLINICAL DECISION MAKING: Evolving/moderate complexity  EVALUATION COMPLEXITY: Moderate   GOALS: Goals reviewed with patient? Yes  SHORT TERM GOALS: (target date for Short term goals are 3 weeks 08/31/23)  1. Patient will demonstrate independent use of home exercise program to maintain progress from in clinic treatments.  Goal status: met 08/28/2023  LONG TERM GOALS: (target dates for all long term goals are 10 weeks  10/19/23 )   1. Patient will demonstrate/report pain at worst less than or equal to 2/10 to facilitate minimal limitation in daily activity secondary to pain symptoms.  Goal status: on going 08/28/2023   2. Patient will demonstrate independent use of home exercise program to facilitate ability to maintain/progress functional gains from skilled physical therapy services.  Goal status:  on going 09/25/23   3. Patient will demonstrate FOTO outcome > or = 46 % to indicate reduced disability due to condition.  Goal status:  on going 09/25/23   4. Patient will improve her 5 time sit to stand to </= 30 seconds with/without UE support.   Goal status:  on going 09/25/23   5.  Pt  will be able to navigate 5 steps with single hand rail with step over step gait pattern.  Goal status:  on going 09/25/23   6.  Pt will improve her bilateral cervical rotation to >/= 50 degrees to improved functional mobility.  Goal status:  on going 09/25/23  7. Pt will be able to tolerate sitting for up to 20 minutes with pain </= 3/10 for improved driving tolerance.  Goal Status:  on going 09/25/23     PLAN:  PT FREQUENCY: 1-2x/week  PT DURATION: 10 weeks  PLANNED INTERVENTIONS: Therapeutic exercises, Therapeutic activity,  Neuro Muscular re-education, Balance training, Gait training, Patient/Family education, Joint mobilization, Stair training, DME instructions, Dry Needling, Electrical stimulation, Cryotherapy, vasopneumatic device, Moist heat, Taping, Traction Ultrasound, Ionotophoresis 4mg /ml Dexamethasone, and aquatic therapy, Manual  therapy.  All included unless contraindicated  PLAN FOR NEXT SESSION:   How was aquatic PT? Continue activity tolerance improvements.  Percussive device and  E-stim as needed/desired

## 2023-10-02 ENCOUNTER — Encounter: Payer: BC Managed Care – PPO | Admitting: Rehabilitative and Restorative Service Providers"

## 2023-10-09 ENCOUNTER — Encounter: Payer: Self-pay | Admitting: Physical Therapy

## 2023-10-09 ENCOUNTER — Ambulatory Visit: Payer: BC Managed Care – PPO | Admitting: Orthopaedic Surgery

## 2023-10-09 ENCOUNTER — Ambulatory Visit (INDEPENDENT_AMBULATORY_CARE_PROVIDER_SITE_OTHER): Payer: BC Managed Care – PPO | Admitting: Physical Therapy

## 2023-10-09 DIAGNOSIS — R262 Difficulty in walking, not elsewhere classified: Secondary | ICD-10-CM

## 2023-10-09 DIAGNOSIS — M542 Cervicalgia: Secondary | ICD-10-CM

## 2023-10-09 DIAGNOSIS — M546 Pain in thoracic spine: Secondary | ICD-10-CM

## 2023-10-09 DIAGNOSIS — M6281 Muscle weakness (generalized): Secondary | ICD-10-CM

## 2023-10-09 DIAGNOSIS — M5459 Other low back pain: Secondary | ICD-10-CM

## 2023-10-09 NOTE — Therapy (Signed)
OUTPATIENT PHYSICAL THERAPY  TREATMENT PROGRESS NOTE   Patient Name: Morgan Molina MRN: [id number] DOB:1976-03-16, 47 y.o., female Today's Date: 10/09/2023 Progress Note Reporting Period 08/08/23 to 09/25/23  See note below for Objective Data and Assessment of Progress/Goals.      END OF SESSION:  PT End of Session - 10/09/23 0848     Visit Number 12    Number of Visits 20    Date for PT Re-Evaluation 10/19/23    Progress Note Due on Visit 20    PT Start Time 0802    PT Stop Time 0845    PT Time Calculation (min) 43 min    Activity Tolerance Patient tolerated treatment well    Behavior During Therapy [facility] for tasks assessed/performed                      Past Medical History:  Diagnosis Date   Anemia    Anxiety    Arthritis    Depression    Dyspnea    Headache    Hypertension    Muscle spasm    PTSD (post-traumatic stress disorder) 09/28/2019   Syncope    Past Surgical History:  Procedure Laterality Date   ANTERIOR CERVICAL DECOMP/DISCECTOMY FUSION N/A 10/04/2022   Procedure: CERVICAL THREE-FOUR, CERVICAL FOUR-FIVE, CERVICAL FIVE-SIX, CERVICAL SIX-SEVEN, ANTERIOR CERVICAL DISCECTOMY FUSION, ALLOGRAFT, PLATE;  Surgeon: Eldred Manges, MD;  Location: [facility] OR;  Service: Orthopedics;  Laterality: N/A;   BREAST EXCISIONAL BIOPSY Right    CARPAL TUNNEL RELEASE Left 02/20/2020   Procedure: LEFT CARPAL TUNNEL RELEASE;  Surgeon: Betha Loa, MD;  Location: [facility];  Service: Orthopedics;  Laterality: Left;   CARPAL TUNNEL RELEASE Right 02/28/2021   Procedure: CARPAL TUNNEL RELEASE;  Surgeon: Betha Loa, MD;  Location: [facility];  Service: Orthopedics;  Laterality: Right;   CHOLECYSTECTOMY     GANGLION CYST EXCISION Left 02/20/2020   Procedure: LEFT DORSAL GANGLION EXCISION;  Surgeon: Betha Loa, MD;  Location: [facility];  Service: Orthopedics;  Laterality: Left;   TRIGGER FINGER RELEASE Bilateral 08/29/2021    Procedure: RELEASE TRIGGER FINGER/A-1 PULLEY BILATERAL THUMBS;  Surgeon: Betha Loa, MD;  Location: [facility];  Service: Orthopedics;  Laterality: Bilateral;  30 MIN   TUBAL LIGATION     Patient Active Problem List   Diagnosis Date Noted   Status post cervical spinal fusion 07/13/2023   Spinal stenosis of lumbar region 07/13/2023   Cervical spinal stenosis 10/04/2022   Chest pain 03/17/2022   Unstable angina (HCC) 03/15/2022   Paresthesias 06/23/2021   Neck pain 06/23/2021   Alteration consciousness 06/23/2021   Anxiety 06/23/2021   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   Arthralgia 03/30/2021   Hyperglycemia 03/30/2021   Shortness of breath 03/30/2021   Bilateral chronic serous otitis media 08/15/2020   Eustachian tube dysfunction, bilateral 08/15/2020   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   Ganglion cyst of dorsum of left wrist 10/01/2019   Decreased visual acuity 10/01/2019   PTSD (post-traumatic stress disorder) 09/28/2019   Nipple discharge 04/02/2019   Panic attacks 02/19/2019   Bilateral low back pain 02/19/2019   Seasonal allergies 02/19/2019   Anxiety and depression 01/14/2019   Syncope 01/14/2019   Breast mass, right 01/14/2019   Hypertension     PCP: Grayce Sessions, NP   REFERRING PROVIDER: Eldred Manges, MD   REFERRING DIAG:  Diagnosis  M54.42,M54.41,G89.29 (ICD-10-CM) - Chronic bilateral low back pain with  bilateral sciatica    Rationale for Evaluation and Treatment: Rehabilitation  THERAPY DIAG:  Other low back pain  Pain in thoracic spine  Cervicalgia  Difficulty in walking, not elsewhere classified  Muscle weakness (generalized)  ONSET DATE: over a year  SUBJECTIVE:                                                                                                                                                                                           SUBJECTIVE STATEMENT: Pt stating aquatic therapy was great. Pt  did report after leaving the pool she felt compression return.   PERTINENT HISTORY:  4 level cervical fusion November 2023, recent spinal epidural, anxiety, PTSD, carpal tunnel, unstable angina, depression, arthritis, dyspnea, HA, muscle spasm  PAIN:  NPRS scale:  7/10 upon arrival, 10/10 at worse Pain location: low back and neck Pain description: achy, throbbing Aggravating factors: "basic activities" prolonged standing, house activity  Relieving factors: nothing seems to help  PRECAUTIONS: None  WEIGHT BEARING RESTRICTIONS: No  FALLS:  Has patient fallen in last 6 months? No  LIVING ENVIRONMENT: Lives with: lives with their family and lives with their spouse Lives in: House/apartment Stairs: Yes: External: 5 steps; on left going up Has following equipment at home: None  OCCUPATION: not currently working  PLOF: Independent  PATIENT GOALS: Be able to move better, be able to cook a full meal. Be able to perform ADL's and household chores. Be able to drive more than 10 minutes at a time.     OBJECTIVE:   DIAGNOSTIC FINDINGS:  MRI scan showed moderate stenosis L2-3 mild to moderate at 3 4 facet arthropathy bilateral at L2 bilateral L5-S1 and worse on the right at L4-5.  IMPRESSION: 1. Multifactorial degenerative changes at L2-3 with resultant moderate canal and bilateral L2 foraminal stenosis. 2. Right foraminal to extraforaminal disc protrusions at L3-4 and L4-5, contacting and potentially affecting the exiting right L3 and L4 nerve roots respectively. 3. Moderate to severe lower lumbar facet arthrosis, most pronounced at L4-5 on the right and L5-S1 on the left. Findings could contribute to lower back pain.  PATIENT SURVEYS:  09/11/23: FOTO: 32 08/08/23: FOTO eval:  25%   SCREENING FOR RED FLAGS: 08/08/23 Bowel or bladder incontinence: No Cauda equina syndrome: No  COGNITION: 08/08/23 Overall cognitive status: WFL normal      SENSATION: 08/08/23 WFL  POSTURE:   08/08/23 rounded shoulders and forward head, increased lumbar lordosis  PALPATION: 08/08/23 TTP: cervical, throacic and lumbar paraspinals, bil upper traps, bil QL, bil iliosacral joint line  ROM:   LUMBAR AROM 08/08/23 08/28/2023 10/09/23  Flexion Finger tips to  top of knees  Finger tips to top of knees  Extension 0, unable 25% WFL, moved to 50 % c repeated x 5 in standing.   End range pain noted 12 degrees  Right lateral flexion 16  14  Left lateral flexion 15  10  Right rotation Limited 80%  Limited 50%  Left rotation limited 80%  Limited 75%   (Blank rows = not tested)  CERVICAL AROM 08/08/23 08/28/2023 09/25/23 10/09/23  Flexion 10 22 24 15   Extension 12 25 25 16   Right lateral flexion 14     Left lateral flexion 11     Right rotation 25 35 40 25  Left rotation       32          45         45         24    LOWER EXTREMITY ROM:       Right 08/08/23 Left 08/08/23 Rt 10/09/23 Left 10/09/23  Hip flexion 80 76 90 92  Hip extension      Hip abduction      Hip adduction      Hip internal rotation      Hip external rotation      Knee flexion      Knee extension       (Blank rows = not tested)  LOWER EXTREMITY MMT:    MMT Right 08/08/23 Left 08/08/23 Rt / Left 10/09/23  Hip flexion 3- 3- 4 / 4   Hip extension     Hip abduction 3 3 4  / 4   Hip adduction 3 3 4  / 4  Hip internal rotation     Hip external rotation     Knee flexion 4 4 5  / 5   Knee extension 4 4 5  / 5   Cervical extension strength 15.6 pounds with hand-held dynamometer     (Blank rows = not tested)  LUMBAR SPECIAL TESTS:  08/08/23 Slump test: Negative bilateral pt reporting more hamstring tightness bilaterally  FUNCTIONAL TESTS:  08/08/23 5 times sit to stand: 90 seconds c UE support 10/09/23:  5 time sit to stand: 37.8 seconds UE support  GAIT: 08/08/23 Distance walked: clinic distances level surface Assistive device utilized: None Level of assistance: Complete Independence Comments: wide BOS,  antalgic gait  TODAY'S TREATMENT:                                                                         DATE: 09/28/23 TherEx:  Nustep: level 5 x 8 minutes UE/LE Sit to stand x 5 c UE support Supine trunk rotation x 3 bil side holding 30 sec Supine marching with abdominal activation  MMT and ROM measurements repeated this visit.    TODAY'S TREATMENT:                                                                         DATE: 09/28/23 Pt seen for aquatic therapy today.  Treatment took place in water 3.5-4.75 ft in depth at the [facility] pool. Temp of water was 91.  Pt entered/exited the pool via stairs with hand rail.   Pt requires the buoyancy and hydrostatic pressure of water for support, and to offload joints by unweighting joint load by at least 50 % in navel deep water and by at least 75-80% in chest to neck deep water.  Viscosity of the water is needed for resistance of strengthening. Water current perturbations provides challenge to standing balance requiring increased core activation. Aquatic PT exercises Forward walking and backward walking 3 round trips, began with floating bar then progressed to no UE support Sidestepping 4 round trips Tandem walk 2 round trips with floating bar Lumbar flexion L stretch 5 sec X 10 holding at pool wall Leg swings hip abd/add X 10 bilat with UE support with UE support Leg swings hip flexion/extension X 15 bilat Marches X 15 bilat Monster walks 3 round trips Seated on noodle bicycle X 2 min for spinal decompression  DATE: 09/25/2023:  Modalities:  Pre-modulated E-stim performed to pt's Rt lumbar paraspinals and QL x 30 minutes, intensity to pt's tolerance moist heat applied but pt unable to tolerate more than 5  miinutes     TODAY'S TREATMENT:                                                                        DATE: 09/18/2023:  TherEx:  Nustep Lvl 6 UE/LE 10 mins   Leg press double leg 100 lbs x 15, single leg x 15  56 lbs performed bilaterally  Seated green band bilateral shoudler ER c scap retraction 2 x 15 Seated multifidi isometric c UE lift into table 10 sec hold x 8 Seated press down UE into pball ab contraction 10 sec hold x 8  Manual Percussive device to bilateral upper traps, Rt lower lumbar musculature for myofascial relaxation.   TODAY'S TREATMENT:  DATE: 09/13/2023:  TherEx:  Nustep Lvl 7 8 mins UE/LE for aerobic exercise and mobility  Green tband rows 2 x 15 bilateral Green tband gh ext 2 x 15 bilateral  Manual Percussive device to bilateral upper traps, Rt lower lumbar musculature for myofascial relaxation.   TODAY'S TREATMENT:                                                                        DATE: 09/10/23:  TherEx:  Nustep: Level 7 x 8 minutes Rows L3 band 2x15; 3 sec hold Shoulder Extension: L3; 2 x 15   Manual:  Percussion device to bilateral upper traps, rhomboid; infraspinatus and teres minor; holds at trigger points with good tolerance today      PATIENT EDUCATION:  Education details: HEP, POC Person educated: Patient Education method: Programmer, multimedia, Demonstration, Verbal cues, and Handouts Education comprehension: verbalized understanding, returned demonstration, and verbal cues required  HOME EXERCISE PROGRAM: Access Code: [email or web address] URL: [email or web address] Date: 08/08/2023 Prepared by: Narda Amber  Exercises - Seated Hamstring Stretch  - 2 x daily - 7 x weekly - 3 reps - 20-30 seconds hold - Sit to Stand with Counter Support  - 2 x daily - 7 x weekly - 2-3 sets - 5 reps - Standing Lumbar Extension at Wall - Forearms  - 1 x daily - 7 x weekly - 3 sets - 10  reps - Gentle Levator Scapulae Stretch  - 2 x daily - 7 x weekly - 3 reps - 10 seconds hold  ASSESSMENT:  CLINICAL IMPRESSION: Pt stating she feels like aquatic therapy helped, but pain returned following. Pt stating she has an MRI tomorrow. All ROM measurements and MMT performed today. Pt with strength improvements noted in pt's bilateral LE's but still progressing with ROM which is limited by pain and can vary certain days. Recommended continues skilled PT with more aquatic visits scheduled.   OBJECTIVE IMPAIRMENTS: decreased mobility, difficulty walking, decreased ROM, decreased strength, and pain.   ACTIVITY LIMITATIONS: lifting, bending, sitting, standing, squatting, and sleeping  PARTICIPATION LIMITATIONS: cleaning, laundry, driving, and community activity, occupation, sleeping, transfers, cooking, and ADL's  PERSONAL FACTORS: 3+ comorbidities: see pertinent history  are also affecting patient's functional outcome.   REHAB POTENTIAL: Fair due to multiple areas to address  CLINICAL DECISION MAKING: Evolving/moderate complexity  EVALUATION COMPLEXITY: Moderate   GOALS: Goals reviewed with patient? Yes  SHORT TERM GOALS: (target date for Short term goals are 3 weeks 08/31/23)  1. Patient will demonstrate independent use of home exercise program to maintain progress from in clinic treatments.  Goal status: met 08/28/2023  LONG TERM GOALS: (target dates for all long term goals are 10 weeks  10/19/23 )   1. Patient will demonstrate/report pain at worst less than or equal to 2/10 to facilitate minimal limitation in daily activity secondary to pain symptoms.  Goal status: on going 10/09/23   2. Patient will demonstrate independent use of home exercise program to facilitate ability to maintain/progress functional gains from skilled physical therapy services.  Goal status:  on going 10/09/23   3. Patient will demonstrate FOTO outcome > or = 46 % to indicate reduced disability due  to condition.  Goal status:  on going 09/25/23  4. Patient will improve her 5 time sit to stand to </= 30 seconds with/without UE support.   Goal status:  on going 12/3 /24   5.  Pt  will be able to navigate 5 steps with single hand rail with step over step gait pattern.  Goal status:  on going 10/09/23   6.  Pt will improve her bilateral cervical rotation to >/= 50 degrees to improved functional mobility.  Goal status:  on going 10/09/23  7. Pt will be able to tolerate sitting for up to 20 minutes with pain </= 3/10 for improved driving tolerance.  Goal Status:  on going 10/09/23     PLAN:  PT FREQUENCY: 1-2x/week  PT DURATION: 10 weeks  PLANNED INTERVENTIONS: Therapeutic exercises, Therapeutic activity, Neuro Muscular re-education, Balance training, Gait training, Patient/Family education, Joint mobilization, Stair training, DME instructions, Dry Needling, Electrical stimulation, Cryotherapy, vasopneumatic device, Moist heat, Taping, Traction Ultrasound, Ionotophoresis 4mg /ml Dexamethasone, and aquatic therapy, Manual therapy.  All included unless contraindicated  PLAN FOR NEXT SESSION:    Continue activity tolerance improvements.  Percussive device and  E-stim as needed/desired Continue land and aquatic therapy

## 2023-10-10 ENCOUNTER — Ambulatory Visit
Admission: RE | Admit: 2023-10-10 | Discharge: 2023-10-10 | Disposition: A | Discharge status: Home / Self Care | Payer: BC Managed Care – PPO | Source: Ambulatory Visit | Attending: Orthopaedic Surgery | Admitting: Orthopaedic Surgery

## 2023-10-10 DIAGNOSIS — M48061 Spinal stenosis, lumbar region without neurogenic claudication: Secondary | ICD-10-CM

## 2023-10-12 ENCOUNTER — Ambulatory Visit: Payer: BC Managed Care – PPO | Admitting: Physical Therapy

## 2023-10-17 ENCOUNTER — Encounter: Payer: Self-pay | Admitting: Physical Therapy

## 2023-10-17 ENCOUNTER — Telehealth: Payer: Self-pay | Admitting: Orthopaedic Surgery

## 2023-10-17 ENCOUNTER — Ambulatory Visit (INDEPENDENT_AMBULATORY_CARE_PROVIDER_SITE_OTHER): Payer: BC Managed Care – PPO | Admitting: Physical Therapy

## 2023-10-17 DIAGNOSIS — M5459 Other low back pain: Secondary | ICD-10-CM | POA: Diagnosis not present

## 2023-10-17 DIAGNOSIS — M546 Pain in thoracic spine: Secondary | ICD-10-CM

## 2023-10-17 DIAGNOSIS — M542 Cervicalgia: Secondary | ICD-10-CM

## 2023-10-17 NOTE — Telephone Encounter (Signed)
Pt requesting handicap placard please advise

## 2023-10-17 NOTE — Therapy (Signed)
OUTPATIENT PHYSICAL THERAPY  TREATMENT   Patient Name: Morgan Molina MRN: [id number] DOB:1976/08/31, 47 y.o., female Today's Date: 10/17/2023   END OF SESSION:  PT End of Session - 10/17/23 0819     Visit Number 13    Number of Visits 20    Date for PT Re-Evaluation 10/19/23    Progress Note Due on Visit 20    PT Start Time 0810    PT Stop Time 0845    PT Time Calculation (min) 35 min    Activity Tolerance Patient tolerated treatment well    Behavior During Therapy [facility] for tasks assessed/performed               Past Medical History:  Diagnosis Date   Anemia    Anxiety    Arthritis    Depression    Dyspnea    Headache    Hypertension    Muscle spasm    PTSD (post-traumatic stress disorder) 09/28/2019   Syncope    Past Surgical History:  Procedure Laterality Date   ANTERIOR CERVICAL DECOMP/DISCECTOMY FUSION N/A 10/04/2022   Procedure: CERVICAL THREE-FOUR, CERVICAL FOUR-FIVE, CERVICAL FIVE-SIX, CERVICAL SIX-SEVEN, ANTERIOR CERVICAL DISCECTOMY FUSION, ALLOGRAFT, PLATE;  Surgeon: Eldred Manges, MD;  Location: [facility] OR;  Service: Orthopedics;  Laterality: N/A;   BREAST EXCISIONAL BIOPSY Right    CARPAL TUNNEL RELEASE Left 02/20/2020   Procedure: LEFT CARPAL TUNNEL RELEASE;  Surgeon: Betha Loa, MD;  Location: [facility];  Service: Orthopedics;  Laterality: Left;   CARPAL TUNNEL RELEASE Right 02/28/2021   Procedure: CARPAL TUNNEL RELEASE;  Surgeon: Betha Loa, MD;  Location: [facility];  Service: Orthopedics;  Laterality: Right;   CHOLECYSTECTOMY     GANGLION CYST EXCISION Left 02/20/2020   Procedure: LEFT DORSAL GANGLION EXCISION;  Surgeon: Betha Loa, MD;  Location: [facility];  Service: Orthopedics;  Laterality: Left;   TRIGGER FINGER RELEASE Bilateral 08/29/2021   Procedure: RELEASE TRIGGER FINGER/A-1 PULLEY BILATERAL THUMBS;  Surgeon: Betha Loa, MD;  Location: [facility];  Service: Orthopedics;   Laterality: Bilateral;  30 MIN   TUBAL LIGATION     Patient Active Problem List   Diagnosis Date Noted   Status post cervical spinal fusion 07/13/2023   Spinal stenosis of lumbar region 07/13/2023   Cervical spinal stenosis 10/04/2022   Chest pain 03/17/2022   Unstable angina (HCC) 03/15/2022   Paresthesias 06/23/2021   Neck pain 06/23/2021   Alteration consciousness 06/23/2021   Anxiety 06/23/2021   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   Arthralgia 03/30/2021   Hyperglycemia 03/30/2021   Shortness of breath 03/30/2021   Bilateral chronic serous otitis media 08/15/2020   Eustachian tube dysfunction, bilateral 08/15/2020   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   Ganglion cyst of dorsum of left wrist 10/01/2019   Decreased visual acuity 10/01/2019   PTSD (post-traumatic stress disorder) 09/28/2019   Nipple discharge 04/02/2019   Panic attacks 02/19/2019   Bilateral low back pain 02/19/2019   Seasonal allergies 02/19/2019   Anxiety and depression 01/14/2019   Syncope 01/14/2019   Breast mass, right 01/14/2019   Hypertension     PCP: Grayce Sessions, NP   REFERRING PROVIDER: Eldred Manges, MD   REFERRING DIAG:  Diagnosis  M54.42,M54.41,G89.29 (ICD-10-CM) - Chronic bilateral low back pain with bilateral sciatica    Rationale for Evaluation and Treatment: Rehabilitation  THERAPY DIAG:  Other low back pain  Pain in thoracic spine  Cervicalgia  ONSET DATE: over  a year  SUBJECTIVE:                                                                                                                                                                                           SUBJECTIVE STATEMENT: Pt stating her back is bothering her.   PERTINENT HISTORY:  4 level cervical fusion November 2023, recent spinal epidural, anxiety, PTSD, carpal tunnel, unstable angina, depression, arthritis, dyspnea, HA, muscle spasm  PAIN:  NPRS scale:  7/10 upon arrival Pain location:  low back and neck Pain description: achy, throbbing Aggravating factors: "basic activities" prolonged standing, house activity  Relieving factors: nothing seems to help  PRECAUTIONS: None  WEIGHT BEARING RESTRICTIONS: No  FALLS:  Has patient fallen in last 6 months? No  LIVING ENVIRONMENT: Lives with: lives with their family and lives with their spouse Lives in: House/apartment Stairs: Yes: External: 5 steps; on left going up Has following equipment at home: None  OCCUPATION: not currently working  PLOF: Independent  PATIENT GOALS: Be able to move better, be able to cook a full meal. Be able to perform ADL's and household chores. Be able to drive more than 10 minutes at a time.     OBJECTIVE:   DIAGNOSTIC FINDINGS:  MRI scan showed moderate stenosis L2-3 mild to moderate at 3 4 facet arthropathy bilateral at L2 bilateral L5-S1 and worse on the right at L4-5.  IMPRESSION: 1. Multifactorial degenerative changes at L2-3 with resultant moderate canal and bilateral L2 foraminal stenosis. 2. Right foraminal to extraforaminal disc protrusions at L3-4 and L4-5, contacting and potentially affecting the exiting right L3 and L4 nerve roots respectively. 3. Moderate to severe lower lumbar facet arthrosis, most pronounced at L4-5 on the right and L5-S1 on the left. Findings could contribute to lower back pain.  PATIENT SURVEYS:  09/11/23: FOTO: 32 08/08/23: FOTO eval:  25%   SCREENING FOR RED FLAGS: 08/08/23 Bowel or bladder incontinence: No Cauda equina syndrome: No  COGNITION: 08/08/23 Overall cognitive status: WFL normal      SENSATION: 08/08/23 WFL  POSTURE:  08/08/23 rounded shoulders and forward head, increased lumbar lordosis  PALPATION: 08/08/23 TTP: cervical, throacic and lumbar paraspinals, bil upper traps, bil QL, bil iliosacral joint line  ROM:   LUMBAR AROM 08/08/23 08/28/2023 10/09/23  Flexion Finger tips to top of knees  Finger tips to top of knees   Extension 0, unable 25% WFL, moved to 50 % c repeated x 5 in standing.   End range pain noted 12 degrees  Right lateral flexion 16  14  Left lateral flexion 15  10  Right rotation Limited 80%  Limited 50%  Left rotation limited 80%  Limited 75%   (Blank rows = not tested)  CERVICAL AROM 08/08/23 08/28/2023 09/25/23 10/09/23  Flexion 10 22 24 15   Extension 12 25 25 16   Right lateral flexion 14     Left lateral flexion 11     Right rotation 25 35 40 25  Left rotation       32          45         45         24    LOWER EXTREMITY ROM:       Right 08/08/23 Left 08/08/23 Rt 10/09/23 Left 10/09/23  Hip flexion 80 76 90 92  Hip extension      Hip abduction      Hip adduction      Hip internal rotation      Hip external rotation      Knee flexion      Knee extension       (Blank rows = not tested)  LOWER EXTREMITY MMT:    MMT Right 08/08/23 Left 08/08/23 Rt / Left 10/09/23  Hip flexion 3- 3- 4 / 4   Hip extension     Hip abduction 3 3 4  / 4   Hip adduction 3 3 4  / 4  Hip internal rotation     Hip external rotation     Knee flexion 4 4 5  / 5   Knee extension 4 4 5  / 5   Cervical extension strength 15.6 pounds with hand-held dynamometer     (Blank rows = not tested)  LUMBAR SPECIAL TESTS:  08/08/23 Slump test: Negative bilateral pt reporting more hamstring tightness bilaterally  FUNCTIONAL TESTS:  08/08/23 5 times sit to stand: 90 seconds c UE support 10/09/23:  5 time sit to stand: 37.8 seconds UE support  GAIT: 08/08/23 Distance walked: clinic distances level surface Assistive device utilized: None Level of assistance: Complete Independence Comments: wide BOS, antalgic gait                                                                                                                                                                                                                    TODAY'S TREATMENT:  DATE: 10/17/23 TherEx:  Nustep: level 5 x 8 minutes UE/LE Standing alternating unilat rows green X 15 bilat Standing alternating unilat extensions green X 15 bilat Standing L stretch 5 sec X10 at sink, holding 5 sec flexion Standing lumbar extensions X10, holding 5 sec Anti rotation press green X 15 bilat Sit to stand x 5 c UE support Pball roll up wall for lumbar-thoracic stretching 5 sec X10  DATE: 09/28/23 TherEx:  Nustep: level 5 x 8 minutes UE/LE Sit to stand x 5 c UE support Supine trunk rotation x 3 bil side holding 30 sec Supine marching with abdominal activation  MMT and ROM measurements repeated this visit.    TODAY'S TREATMENT:                                                                         DATE: 09/28/23 Pt seen for aquatic therapy today.  Treatment took place in water 3.5-4.75 ft in depth at the [facility] pool. Temp of water was 91.  Pt entered/exited the pool via stairs with hand rail.   Pt requires the buoyancy and hydrostatic pressure of water for support, and to offload joints by unweighting joint load by at least 50 % in navel deep water and by at least 75-80% in chest to neck deep water.  Viscosity of the water is needed for resistance of strengthening. Water current perturbations provides challenge to standing balance requiring increased core activation. Aquatic PT exercises Forward walking and backward walking 3 round trips, began with floating bar then progressed to no UE support Sidestepping 4 round trips Tandem walk 2 round trips with floating bar Lumbar flexion L stretch 5 sec X 10 holding at pool wall Leg swings hip abd/add X 10 bilat with UE support with UE support Leg swings hip flexion/extension X 15 bilat Marches X 15 bilat Monster walks 3 round trips Seated on noodle bicycle X 2 min for spinal decompression  DATE: 09/25/2023:  Modalities:  Pre-modulated E-stim performed to pt's Rt lumbar paraspinals and QL x 30 minutes,  intensity to pt's tolerance moist heat applied but pt unable to tolerate more than 5 miinutes     TODAY'S TREATMENT:                                                                        DATE: 09/18/2023:  TherEx:  Nustep Lvl 6 UE/LE 10 mins   Leg press double leg 100 lbs x 15, single leg x 15  56 lbs performed bilaterally  Seated green band bilateral shoudler ER c scap retraction 2 x 15 Seated multifidi isometric c UE lift into table 10 sec hold x 8 Seated press down UE into pball ab contraction 10 sec hold x 8  Manual Percussive device to bilateral upper traps, Rt lower lumbar musculature for myofascial relaxation.   TODAY'S TREATMENT:  DATE: 09/13/2023:  TherEx:  Nustep Lvl 7 8 mins UE/LE for aerobic exercise and mobility  Green tband rows 2 x 15 bilateral Green tband gh ext 2 x 15 bilateral  Manual Percussive device to bilateral upper traps, Rt lower lumbar musculature for myofascial relaxation.   TODAY'S TREATMENT:                                                                        DATE: 09/10/23:  TherEx:  Nustep: Level 7 x 8 minutes Rows L3 band 2x15; 3 sec hold Shoulder Extension: L3; 2 x 15   Manual:  Percussion device to bilateral upper traps, rhomboid; infraspinatus and teres minor; holds at trigger points with good tolerance today      PATIENT EDUCATION:  Education details: HEP, POC Person educated: Patient Education method: Programmer, multimedia, Demonstration, Verbal cues, and Handouts Education comprehension: verbalized understanding, returned demonstration, and verbal cues required  HOME EXERCISE PROGRAM: Access Code: [email or web address] URL: [email or web address] Date: 08/08/2023 Prepared by: Narda Amber  Exercises - Seated Hamstring Stretch  - 2 x daily - 7 x weekly - 3 reps - 20-30 seconds hold - Sit to Stand with Counter Support  - 2 x daily - 7 x weekly - 2-3 sets - 5 reps -  Standing Lumbar Extension at Wall - Forearms  - 1 x daily - 7 x weekly - 3 sets - 10 reps - Gentle Levator Scapulae Stretch  - 2 x daily - 7 x weekly - 3 reps - 10 seconds hold  ASSESSMENT:  CLINICAL IMPRESSION: We worked to improve overall mobility, strength, and functional endurance as tolerated. I did add in additional core strength exercise today.  Recommended continues skilled PT with more aquatic visits scheduled.   OBJECTIVE IMPAIRMENTS: decreased mobility, difficulty walking, decreased ROM, decreased strength, and pain.   ACTIVITY LIMITATIONS: lifting, bending, sitting, standing, squatting, and sleeping  PARTICIPATION LIMITATIONS: cleaning, laundry, driving, and community activity, occupation, sleeping, transfers, cooking, and ADL's  PERSONAL FACTORS: 3+ comorbidities: see pertinent history  are also affecting patient's functional outcome.   REHAB POTENTIAL: Fair due to multiple areas to address  CLINICAL DECISION MAKING: Evolving/moderate complexity  EVALUATION COMPLEXITY: Moderate   GOALS: Goals reviewed with patient? Yes  SHORT TERM GOALS: (target date for Short term goals are 3 weeks 08/31/23)  1. Patient will demonstrate independent use of home exercise program to maintain progress from in clinic treatments.  Goal status: met 08/28/2023  LONG TERM GOALS: (target dates for all long term goals are 10 weeks  10/19/23 )   1. Patient will demonstrate/report pain at worst less than or equal to 2/10 to facilitate minimal limitation in daily activity secondary to pain symptoms.  Goal status: on going 10/09/23   2. Patient will demonstrate independent use of home exercise program to facilitate ability to maintain/progress functional gains from skilled physical therapy services.  Goal status:  on going 10/09/23   3. Patient will demonstrate FOTO outcome > or = 46 % to indicate reduced disability due to condition.  Goal status:  on going 09/25/23   4. Patient will  improve her 5 time sit to stand to </= 30 seconds with/without UE support.   Goal status:  on going 12/3 /24  5.  Pt  will be able to navigate 5 steps with single hand rail with step over step gait pattern.  Goal status:  on going 10/09/23   6.  Pt will improve her bilateral cervical rotation to >/= 50 degrees to improved functional mobility.  Goal status:  on going 10/09/23  7. Pt will be able to tolerate sitting for up to 20 minutes with pain </= 3/10 for improved driving tolerance.  Goal Status:  on going 10/09/23     PLAN:  PT FREQUENCY: 1-2x/week  PT DURATION: 10 weeks  PLANNED INTERVENTIONS: Therapeutic exercises, Therapeutic activity, Neuro Muscular re-education, Balance training, Gait training, Patient/Family education, Joint mobilization, Stair training, DME instructions, Dry Needling, Electrical stimulation, Cryotherapy, vasopneumatic device, Moist heat, Taping, Traction Ultrasound, Ionotophoresis 4mg /ml Dexamethasone, and aquatic therapy, Manual therapy.  All included unless contraindicated  PLAN FOR NEXT SESSION:    Continue activity tolerance improvements.  Percussive device and  E-stim as needed/desired Continue land and aquatic therapy

## 2023-10-19 NOTE — Telephone Encounter (Signed)
Placard at front for patient to pick up. I left voicemail advising.

## 2023-10-23 ENCOUNTER — Ambulatory Visit (INDEPENDENT_AMBULATORY_CARE_PROVIDER_SITE_OTHER): Payer: BC Managed Care – PPO | Admitting: Physical Therapy

## 2023-10-23 ENCOUNTER — Encounter: Payer: Self-pay | Admitting: Physical Therapy

## 2023-10-23 DIAGNOSIS — R262 Difficulty in walking, not elsewhere classified: Secondary | ICD-10-CM

## 2023-10-23 DIAGNOSIS — M6281 Muscle weakness (generalized): Secondary | ICD-10-CM

## 2023-10-23 DIAGNOSIS — M546 Pain in thoracic spine: Secondary | ICD-10-CM

## 2023-10-23 DIAGNOSIS — M542 Cervicalgia: Secondary | ICD-10-CM

## 2023-10-23 DIAGNOSIS — M5459 Other low back pain: Secondary | ICD-10-CM

## 2023-10-23 NOTE — Therapy (Addendum)
OUTPATIENT PHYSICAL THERAPY  TREATMENT Re-certification   Patient Name: Morgan Molina MRN: [id number] DOB:[birth year], 47 y.o., female Today's Date: 10/23/2023   END OF SESSION:  PT End of Session - 10/23/23 0821     Visit Number 14    Number of Visits 20    Date for PT Re-Evaluation 10/19/23    Progress Note Due on Visit 20    PT Start Time 0802    PT Stop Time 0840    PT Time Calculation (min) 38 min    Activity Tolerance Patient tolerated treatment well    Behavior During Therapy [facility] for tasks assessed/performed            Updated date for re-evaluation is 12/14/2023    Past Medical History:  Diagnosis Date   Anemia    Anxiety    Arthritis    Depression    Dyspnea    Headache    Hypertension    Muscle spasm    PTSD (post-traumatic stress disorder) 09/28/2019   Syncope    Past Surgical History:  Procedure Laterality Date   ANTERIOR CERVICAL DECOMP/DISCECTOMY FUSION N/A 10/04/2022   Procedure: CERVICAL THREE-FOUR, CERVICAL FOUR-FIVE, CERVICAL FIVE-SIX, CERVICAL SIX-SEVEN, ANTERIOR CERVICAL DISCECTOMY FUSION, ALLOGRAFT, PLATE;  Surgeon: Eldred Manges, MD;  Location: [facility];  Service: Orthopedics;  Laterality: N/A;   BREAST EXCISIONAL BIOPSY Right    CARPAL TUNNEL RELEASE Left 02/20/2020   Procedure: LEFT CARPAL TUNNEL RELEASE;  Surgeon: Betha Loa, MD;  Location: [facility];  Service: Orthopedics;  Laterality: Left;   CARPAL TUNNEL RELEASE Right 02/28/2021   Procedure: CARPAL TUNNEL RELEASE;  Surgeon: Betha Loa, MD;  Location: [facility];  Service: Orthopedics;  Laterality: Right;   CHOLECYSTECTOMY     GANGLION CYST EXCISION Left 02/20/2020   Procedure: LEFT DORSAL GANGLION EXCISION;  Surgeon: Betha Loa, MD;  Location: [facility];  Service: Orthopedics;  Laterality: Left;   TRIGGER FINGER RELEASE Bilateral 08/29/2021   Procedure: RELEASE TRIGGER FINGER/A-1 PULLEY BILATERAL THUMBS;  Surgeon: Betha Loa, MD;   Location: [facility];  Service: Orthopedics;  Laterality: Bilateral;  30 MIN   TUBAL LIGATION     Patient Active Problem List   Diagnosis Date Noted   Status post cervical spinal fusion 07/13/2023   Spinal stenosis of lumbar region 07/13/2023   Cervical spinal stenosis 10/04/2022   Chest pain 03/17/2022   Unstable angina (HCC) 03/15/2022   Paresthesias 06/23/2021   Neck pain 06/23/2021   Alteration consciousness 06/23/2021   Anxiety 06/23/2021   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   Arthralgia 03/30/2021   Hyperglycemia 03/30/2021   Shortness of breath 03/30/2021   Bilateral chronic serous otitis media 08/15/2020   Eustachian tube dysfunction, bilateral 08/15/2020   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   Ganglion cyst of dorsum of left wrist 10/01/2019   Decreased visual acuity 10/01/2019   PTSD (post-traumatic stress disorder) 09/28/2019   Nipple discharge 04/02/2019   Panic attacks 02/19/2019   Bilateral low back pain 02/19/2019   Seasonal allergies 02/19/2019   Anxiety and depression 01/14/2019   Syncope 01/14/2019   Breast mass, right 01/14/2019   Hypertension     PCP: Grayce Sessions, NP   REFERRING PROVIDER: Eldred Manges, MD   REFERRING DIAG:  Diagnosis  M54.42,M54.41,G89.29 (ICD-10-CM) - Chronic bilateral low back pain with bilateral sciatica    Rationale for Evaluation and Treatment: Rehabilitation  THERAPY DIAG:  Other low back pain  Pain in thoracic  spine  Cervicalgia  Difficulty in walking, not elsewhere classified  Muscle weakness (generalized)  ONSET DATE: over a year  SUBJECTIVE:                                                                                                                                                                                           SUBJECTIVE STATEMENT: Pt stating she has been up a while walking her neighborhood looking for a lost package that was delivered to wrong place.   PERTINENT  HISTORY:  4 level cervical fusion November 2023, recent spinal epidural, anxiety, PTSD, carpal tunnel, unstable angina, depression, arthritis, dyspnea, HA, muscle spasm  PAIN:  NPRS scale:  8/10 upon arrival Pain location: low back and neck Pain description: achy, throbbing Aggravating factors: "basic activities" prolonged standing, house activity  Relieving factors: nothing seems to help  PRECAUTIONS: None  WEIGHT BEARING RESTRICTIONS: No  FALLS:  Has patient fallen in last 6 months? No  LIVING ENVIRONMENT: Lives with: lives with their family and lives with their spouse Lives in: House/apartment Stairs: Yes: External: 5 steps; on left going up Has following equipment at home: None  OCCUPATION: not currently working  PLOF: Independent  PATIENT GOALS: Be able to move better, be able to cook a full meal. Be able to perform ADL's and household chores. Be able to drive more than 10 minutes at a time.     OBJECTIVE:   DIAGNOSTIC FINDINGS:  MRI scan showed moderate stenosis L2-3 mild to moderate at 3 4 facet arthropathy bilateral at L2 bilateral L5-S1 and worse on the right at L4-5.  IMPRESSION: 1. Multifactorial degenerative changes at L2-3 with resultant moderate canal and bilateral L2 foraminal stenosis. 2. Right foraminal to extraforaminal disc protrusions at L3-4 and L4-5, contacting and potentially affecting the exiting right L3 and L4 nerve roots respectively. 3. Moderate to severe lower lumbar facet arthrosis, most pronounced at L4-5 on the right and L5-S1 on the left. Findings could contribute to lower back pain.  PATIENT SURVEYS:  09/11/23: FOTO: 32 08/08/23: FOTO eval:  25%   SCREENING FOR RED FLAGS: 08/08/23 Bowel or bladder incontinence: No Cauda equina syndrome: No  COGNITION: 08/08/23 Overall cognitive status: WFL normal      SENSATION: 08/08/23 WFL  POSTURE:  08/08/23 rounded shoulders and forward head, increased lumbar  lordosis  PALPATION: 08/08/23 TTP: cervical, throacic and lumbar paraspinals, bil upper traps, bil QL, bil iliosacral joint line  ROM:   LUMBAR AROM 08/08/23 08/28/2023 10/09/23  Flexion Finger tips to top of knees  Finger tips to top of knees  Extension 0, unable 25% WFL, moved to 50 % c  repeated x 5 in standing.   End range pain noted 12 degrees  Right lateral flexion 16  14  Left lateral flexion 15  10  Right rotation Limited 80%  Limited 50%  Left rotation limited 80%  Limited 75%   (Blank rows = not tested)  CERVICAL AROM 08/08/23 08/28/2023 09/25/23 10/09/23  Flexion 10 22 24 15   Extension 12 25 25 16   Right lateral flexion 14     Left lateral flexion 11     Right rotation 25 35 40 25  Left rotation       32          45         45         24    LOWER EXTREMITY ROM:       Right 08/08/23 Left 08/08/23 Rt 10/09/23 Left 10/09/23  Hip flexion 80 76 90 92  Hip extension      Hip abduction      Hip adduction      Hip internal rotation      Hip external rotation      Knee flexion      Knee extension       (Blank rows = not tested)  LOWER EXTREMITY MMT:    MMT Right 08/08/23 Left 08/08/23 Rt / Left 10/09/23  Hip flexion 3- 3- 4 / 4   Hip extension     Hip abduction 3 3 4  / 4   Hip adduction 3 3 4  / 4  Hip internal rotation     Hip external rotation     Knee flexion 4 4 5  / 5   Knee extension 4 4 5  / 5   Cervical extension strength 15.6 pounds with hand-held dynamometer     (Blank rows = not tested)  LUMBAR SPECIAL TESTS:  08/08/23 Slump test: Negative bilateral pt reporting more hamstring tightness bilaterally  FUNCTIONAL TESTS:  08/08/23 5 times sit to stand: 90 seconds c UE support 10/09/23:  5 time sit to stand: 37.8 seconds UE support 10/23/23: 5 time sit to stand: 24.8 seconds UE support  GAIT: 08/08/23 Distance walked: clinic distances level surface Assistive device utilized: None Level of assistance: Complete Independence Comments: wide BOS, antalgic  gait  TODAY'S TREATMENT:                                                                          DATE: 10/23/23 TherEx:  Recumbent bike: Level 4 x 7 minutes Mini wall squats with red physio ball behind pt's back 2 x 10 Standing trunk extension: x 10 holding 10 sec Standing rows: green TB 2 x 10 holding 3 sec Standing hip extension: 2 x 10 bil LE c UE support Supine marching x 20 c core activation Supine SLR: 2 x 10 bil LE Trunk rotation: x 2 bil holding 30 sec     DATE: 10/17/23 TherEx:  Nustep: level 5 x 8 minutes UE/LE Standing alternating unilat rows green X 15 bilat Standing alternating unilat extensions green X 15 bilat Standing L stretch 5 sec X10 at sink, holding 5 sec flexion Standing lumbar extensions X10, holding 5 sec Anti rotation press green X 15 bilat Sit to stand x 5 c UE support Pball roll up wall for lumbar-thoracic stretching 5 sec X10  DATE: 09/28/23 TherEx:  Nustep: level 5 x 8 minutes UE/LE Sit to stand x 5 c UE support Supine trunk rotation x 3 bil side holding 30 sec Supine marching with abdominal activation  MMT and ROM measurements repeated this visit.    TODAY'S TREATMENT:                                                                         DATE: 09/28/23 Pt seen for aquatic therapy today.  Treatment took place in water 3.5-4.75 ft in depth at the [facility] pool. Temp of water was 91.  Pt entered/exited the pool via stairs with hand rail.   Pt requires the buoyancy and hydrostatic pressure of water for support, and to offload joints by unweighting joint load by at least 50 % in navel deep water and by at least 75-80% in chest to neck deep water.  Viscosity of the water is needed for resistance of strengthening. Water  current perturbations provides challenge to standing balance requiring increased core activation. Aquatic PT exercises Forward walking and backward walking 3 round trips, began with floating bar then progressed to no UE support Sidestepping 4 round trips Tandem walk 2 round trips with floating bar Lumbar flexion L stretch 5 sec X 10 holding at pool wall Leg swings hip abd/add X 10 bilat with UE support with UE support Leg swings hip flexion/extension X 15 bilat Marches X 15 bilat Monster walks 3 round trips Seated on noodle bicycle X 2 min for spinal decompression             PATIENT EDUCATION:  Education details: HEP, POC Person educated: Patient Education method: Programmer, multimedia, Facilities manager, Verbal cues, and Handouts Education comprehension: verbalized understanding, returned demonstration, and verbal cues required  HOME EXERCISE PROGRAM: Access Code: [email or web address] URL: [email or web address] Date: 08/08/2023 Prepared by: Narda Amber  Exercises - Seated Hamstring Stretch  - 2 x daily - 7  x weekly - 3 reps - 20-30 seconds hold - Sit to Stand with Counter Support  - 2 x daily - 7 x weekly - 2-3 sets - 5 reps - Standing Lumbar Extension at Wall - Forearms  - 1 x daily - 7 x weekly - 3 sets - 10 reps - Gentle Levator Scapulae Stretch  - 2 x daily - 7 x weekly - 3 reps - 10 seconds hold  ASSESSMENT:  CLINICAL IMPRESSION: Pt reporting aquatic therapy seems to help more due to decreased pain. Pt tolerating exercises well with mild increased in pain reported during hip extension and trunk rotation.pt has improved her 5 time sit to stand to 24.8 seconds and met her LTG. 'm requesting an additional 8 weeks to progress toward pt's goals not met.    OBJECTIVE IMPAIRMENTS: decreased mobility, difficulty walking, decreased ROM, decreased strength, and pain.   ACTIVITY LIMITATIONS: lifting, bending, sitting, standing, squatting, and sleeping  PARTICIPATION LIMITATIONS:  cleaning, laundry, driving, and community activity, occupation, sleeping, transfers, cooking, and ADL's  PERSONAL FACTORS: 3+ comorbidities: see pertinent history  are also affecting patient's functional outcome.   REHAB POTENTIAL: Fair due to multiple areas to address  CLINICAL DECISION MAKING: Evolving/moderate complexity  EVALUATION COMPLEXITY: Moderate   GOALS: Goals reviewed with patient? Yes  SHORT TERM GOALS: (target date for Short term goals are 3 weeks 08/31/23)  1. Patient will demonstrate independent use of home exercise program to maintain progress from in clinic treatments.  Goal status: met 08/28/2023  LONG TERM GOALS: (target dates for all long term goals are 10 weeks  12/14/23 )   1. Patient will demonstrate/report pain at worst less than or equal to 2/10 to facilitate minimal limitation in daily activity secondary to pain symptoms.  Goal status: on going 10/09/23   2. Patient will demonstrate independent use of home exercise program to facilitate ability to maintain/progress functional gains from skilled physical therapy services.  Goal status:  on going 10/09/23   3. Patient will demonstrate FOTO outcome > or = 46 % to indicate reduced disability due to condition.  Goal status:  on going 09/25/23   4. Patient will improve her 5 time sit to stand to </= 30 seconds with/without UE support.   Goal status:  MET 10/23/23   5.  Pt  will be able to navigate 5 steps with single hand rail with step over step gait pattern.  Goal status:  on going 10/09/23   6.  Pt will improve her bilateral cervical rotation to >/= 50 degrees to improved functional mobility.  Goal status:  on going 10/09/23  7. Pt will be able to tolerate sitting for up to 20 minutes with pain </= 3/10 for improved driving tolerance.  Goal Status:  on going 10/09/23     PLAN:  PT FREQUENCY: 1-2x/week  PT DURATION: 8 weeks  PLANNED INTERVENTIONS: Therapeutic exercises, Therapeutic activity,  Neuro Muscular re-education, Balance training, Gait training, Patient/Family education, Joint mobilization, Stair training, DME instructions, Dry Needling, Electrical stimulation, Cryotherapy, vasopneumatic device, Moist heat, Taping, Traction Ultrasound, Ionotophoresis 4mg /ml Dexamethasone, and aquatic therapy, Manual therapy.  All included unless contraindicated  PLAN FOR NEXT SESSION:    Continue activity tolerance improvements.  Percussive device and  E-stim as needed/desired Continue land and aquatic therapy

## 2023-10-24 ENCOUNTER — Other Ambulatory Visit (INDEPENDENT_AMBULATORY_CARE_PROVIDER_SITE_OTHER): Payer: Self-pay | Admitting: Primary Care

## 2023-10-24 DIAGNOSIS — I10 Essential (primary) hypertension: Secondary | ICD-10-CM

## 2023-10-24 DIAGNOSIS — Z760 Encounter for issue of repeat prescription: Secondary | ICD-10-CM

## 2023-10-24 DIAGNOSIS — L304 Erythema intertrigo: Secondary | ICD-10-CM

## 2023-10-24 DIAGNOSIS — L259 Unspecified contact dermatitis, unspecified cause: Secondary | ICD-10-CM

## 2023-10-24 MED ORDER — IRBESARTAN-HYDROCHLOROTHIAZIDE 150-12.5 MG PO TABS: 1.0000 | ORAL_TABLET | Freq: Every day | ORAL | 1 refills | Status: DC

## 2023-10-24 MED ORDER — ALCLOMETASONE DIPROPIONATE 0.05 % EX CREA: TOPICAL_CREAM | Freq: Two times a day (BID) | CUTANEOUS | 0 refills | Status: DC

## 2023-10-24 MED ORDER — TRIAMCINOLONE ACETONIDE 0.1 % EX CREA
TOPICAL_CREAM | Freq: Two times a day (BID) | CUTANEOUS | 3 refills | Status: AC | PRN
Start: 2023-10-24 — End: ?

## 2023-10-24 NOTE — Progress Notes (Signed)
ket 

## 2023-10-26 ENCOUNTER — Encounter: Payer: Self-pay | Admitting: Physical Therapy

## 2023-10-26 ENCOUNTER — Ambulatory Visit (INDEPENDENT_AMBULATORY_CARE_PROVIDER_SITE_OTHER): Payer: BC Managed Care – PPO | Admitting: Physical Therapy

## 2023-10-26 DIAGNOSIS — M542 Cervicalgia: Secondary | ICD-10-CM

## 2023-10-26 DIAGNOSIS — M5459 Other low back pain: Secondary | ICD-10-CM

## 2023-10-26 DIAGNOSIS — R262 Difficulty in walking, not elsewhere classified: Secondary | ICD-10-CM | POA: Diagnosis not present

## 2023-10-26 DIAGNOSIS — M6281 Muscle weakness (generalized): Secondary | ICD-10-CM

## 2023-10-26 DIAGNOSIS — M546 Pain in thoracic spine: Secondary | ICD-10-CM | POA: Diagnosis not present

## 2023-10-26 NOTE — Therapy (Signed)
OUTPATIENT PHYSICAL THERAPY  TREATMENT   Patient Name: Morgan Molina MRN: [id number] DOB:1976/02/24, 47 y.o., female Today's Date: 10/26/2023   END OF SESSION:  PT End of Session - 10/26/23 0846     Visit Number 15    Number of Visits 20    Date for PT Re-Evaluation 10/19/23    Progress Note Due on Visit 20    PT Start Time 0840    PT Stop Time 0920    PT Time Calculation (min) 40 min    Activity Tolerance Patient tolerated treatment well    Behavior During Therapy [facility] for tasks assessed/performed                Past Medical History:  Diagnosis Date   Anemia    Anxiety    Arthritis    Depression    Dyspnea    Headache    Hypertension    Muscle spasm    PTSD (post-traumatic stress disorder) 09/28/2019   Syncope    Past Surgical History:  Procedure Laterality Date   ANTERIOR CERVICAL DECOMP/DISCECTOMY FUSION N/A 10/04/2022   Procedure: CERVICAL THREE-FOUR, CERVICAL FOUR-FIVE, CERVICAL FIVE-SIX, CERVICAL SIX-SEVEN, ANTERIOR CERVICAL DISCECTOMY FUSION, ALLOGRAFT, PLATE;  Surgeon: Eldred Manges, MD;  Location: [facility] OR;  Service: Orthopedics;  Laterality: N/A;   BREAST EXCISIONAL BIOPSY Right    CARPAL TUNNEL RELEASE Left 02/20/2020   Procedure: LEFT CARPAL TUNNEL RELEASE;  Surgeon: Betha Loa, MD;  Location: [facility];  Service: Orthopedics;  Laterality: Left;   CARPAL TUNNEL RELEASE Right 02/28/2021   Procedure: CARPAL TUNNEL RELEASE;  Surgeon: Betha Loa, MD;  Location: [facility];  Service: Orthopedics;  Laterality: Right;   CHOLECYSTECTOMY     GANGLION CYST EXCISION Left 02/20/2020   Procedure: LEFT DORSAL GANGLION EXCISION;  Surgeon: Betha Loa, MD;  Location: [facility];  Service: Orthopedics;  Laterality: Left;   TRIGGER FINGER RELEASE Bilateral 08/29/2021   Procedure: RELEASE TRIGGER FINGER/A-1 PULLEY BILATERAL THUMBS;  Surgeon: Betha Loa, MD;  Location: [facility];  Service: Orthopedics;   Laterality: Bilateral;  30 MIN   TUBAL LIGATION     Patient Active Problem List   Diagnosis Date Noted   Status post cervical spinal fusion 07/13/2023   Spinal stenosis of lumbar region 07/13/2023   Cervical spinal stenosis 10/04/2022   Chest pain 03/17/2022   Unstable angina (HCC) 03/15/2022   Paresthesias 06/23/2021   Neck pain 06/23/2021   Alteration consciousness 06/23/2021   Anxiety 06/23/2021   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   Arthralgia 03/30/2021   Hyperglycemia 03/30/2021   Shortness of breath 03/30/2021   Bilateral chronic serous otitis media 08/15/2020   Eustachian tube dysfunction, bilateral 08/15/2020   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   Ganglion cyst of dorsum of left wrist 10/01/2019   Decreased visual acuity 10/01/2019   PTSD (post-traumatic stress disorder) 09/28/2019   Nipple discharge 04/02/2019   Panic attacks 02/19/2019   Bilateral low back pain 02/19/2019   Seasonal allergies 02/19/2019   Anxiety and depression 01/14/2019   Syncope 01/14/2019   Breast mass, right 01/14/2019   Hypertension     PCP: Grayce Sessions, NP   REFERRING PROVIDER: Eldred Manges, MD   REFERRING DIAG:  Diagnosis  M54.42,M54.41,G89.29 (ICD-10-CM) - Chronic bilateral low back pain with bilateral sciatica    Rationale for Evaluation and Treatment: Rehabilitation  THERAPY DIAG:  Other low back pain  Pain in thoracic spine  Cervicalgia  Difficulty in  walking, not elsewhere classified  Muscle weakness (generalized)  ONSET DATE: over a year  SUBJECTIVE:                                                                                                                                                                                           SUBJECTIVE STATEMENT: Pt says her whole right side continues to hurt.    PERTINENT HISTORY:  4 level cervical fusion November 2023, recent spinal epidural, anxiety, PTSD, carpal tunnel, unstable angina, depression,  arthritis, dyspnea, HA, muscle spasm  PAIN:  NPRS scale:  8/10 upon arrival Pain location: low back and neck Pain description: achy, throbbing Aggravating factors: "basic activities" prolonged standing, house activity  Relieving factors: nothing seems to help  PRECAUTIONS: None  WEIGHT BEARING RESTRICTIONS: No  FALLS:  Has patient fallen in last 6 months? No  LIVING ENVIRONMENT: Lives with: lives with their family and lives with their spouse Lives in: House/apartment Stairs: Yes: External: 5 steps; on left going up Has following equipment at home: None  OCCUPATION: not currently working  PLOF: Independent  PATIENT GOALS: Be able to move better, be able to cook a full meal. Be able to perform ADL's and household chores. Be able to drive more than 10 minutes at a time.     OBJECTIVE:   DIAGNOSTIC FINDINGS:  MRI scan showed moderate stenosis L2-3 mild to moderate at 3 4 facet arthropathy bilateral at L2 bilateral L5-S1 and worse on the right at L4-5.  IMPRESSION: 1. Multifactorial degenerative changes at L2-3 with resultant moderate canal and bilateral L2 foraminal stenosis. 2. Right foraminal to extraforaminal disc protrusions at L3-4 and L4-5, contacting and potentially affecting the exiting right L3 and L4 nerve roots respectively. 3. Moderate to severe lower lumbar facet arthrosis, most pronounced at L4-5 on the right and L5-S1 on the left. Findings could contribute to lower back pain.  PATIENT SURVEYS:  09/11/23: FOTO: 32 08/08/23: FOTO eval:  25%   SCREENING FOR RED FLAGS: 08/08/23 Bowel or bladder incontinence: No Cauda equina syndrome: No  COGNITION: 08/08/23 Overall cognitive status: WFL normal      SENSATION: 08/08/23 WFL  POSTURE:  08/08/23 rounded shoulders and forward head, increased lumbar lordosis  PALPATION: 08/08/23 TTP: cervical, throacic and lumbar paraspinals, bil upper traps, bil QL, bil iliosacral joint line  ROM:   LUMBAR AROM  08/08/23 08/28/2023 10/09/23  Flexion Finger tips to top of knees  Finger tips to top of knees  Extension 0, unable 25% WFL, moved to 50 % c repeated x 5 in standing.   End range pain noted 12 degrees  Right lateral flexion 16  14  Left lateral flexion 15  10  Right rotation Limited 80%  Limited 50%  Left rotation limited 80%  Limited 75%   (Blank rows = not tested)  CERVICAL AROM 08/08/23 08/28/2023 09/25/23 10/09/23  Flexion 10 22 24 15   Extension 12 25 25 16   Right lateral flexion 14     Left lateral flexion 11     Right rotation 25 35 40 25  Left rotation       32          45         45         24    LOWER EXTREMITY ROM:       Right 08/08/23 Left 08/08/23 Rt 10/09/23 Left 10/09/23  Hip flexion 80 76 90 92  Hip extension      Hip abduction      Hip adduction      Hip internal rotation      Hip external rotation      Knee flexion      Knee extension       (Blank rows = not tested)  LOWER EXTREMITY MMT:    MMT Right 08/08/23 Left 08/08/23 Rt / Left 10/09/23  Hip flexion 3- 3- 4 / 4   Hip extension     Hip abduction 3 3 4  / 4   Hip adduction 3 3 4  / 4  Hip internal rotation     Hip external rotation     Knee flexion 4 4 5  / 5   Knee extension 4 4 5  / 5   Cervical extension strength 15.6 pounds with hand-held dynamometer     (Blank rows = not tested)  LUMBAR SPECIAL TESTS:  08/08/23 Slump test: Negative bilateral pt reporting more hamstring tightness bilaterally  FUNCTIONAL TESTS:  08/08/23 5 times sit to stand: 90 seconds c UE support 10/09/23:  5 time sit to stand: 37.8 seconds UE support 10/23/23: 5 time sit to stand: 24.8 seconds UE support  GAIT: 08/08/23 Distance walked: clinic distances level surface Assistive device utilized: None Level of assistance: Complete Independence Comments: wide BOS, antalgic gait                                                                                                                                                                                                                     TODAY'S TREATMENT:  TODAY'S TREATMENT:                                                                         DATE: 10/26/23 Pt seen for aquatic therapy today.  Treatment took place in water 3.5-4.75 ft in depth at the [facility] pool. Temp of water was 91.  Pt entered/exited the pool via stairs with hand rail.   Pt requires the buoyancy and hydrostatic pressure of water for support, and to offload joints by unweighting joint load by at least 50 % in navel deep water and by at least 75-80% in chest to neck deep water.  Viscosity of the water is needed for resistance of strengthening. Water current perturbations provides challenge to standing balance requiring increased core activation. Aquatic PT exercises Forward walking and backward walking 3 round trips, began with floating bar then progressed to no UE support Sidestepping 4 round trips Tandem walk 2 round trips with floating bar Lumbar flexion L stretch 5 sec X 10 holding at pool wall Leg swings hip abd/add X 20 bilat with UE support with UE support Leg swings hip flexion/extension X 20 bilat Marches X 15 bilat Monster walks 3 round trips Seated on noodle bicycle X 5 min for spinal decompression  DATE: 10/23/23 TherEx:  Recumbent bike: Level 4 x 7 minutes Mini wall squats with red physio ball behind pt's back 2 x 10 Standing trunk extension: x 10 holding 10 sec Standing rows: green TB 2 x 10 holding 3 sec Standing hip extension: 2 x 10 bil LE c UE support Supine marching x 20 c core activation Supine SLR: 2 x 10 bil LE Trunk rotation: x 2 bil holding 30 sec     DATE: 10/17/23 TherEx:  Nustep: level 5 x 8 minutes UE/LE Standing alternating unilat rows green X 15 bilat Standing alternating unilat extensions green X 15 bilat Standing L stretch 5 sec X10 at sink, holding 5 sec  flexion Standing lumbar extensions X10, holding 5 sec Anti rotation press green X 15 bilat Sit to stand x 5 c UE support Pball roll up wall for lumbar-thoracic stretching 5 sec X10    PATIENT EDUCATION:  Education details: HEP, POC Person educated: Patient Education method: Programmer, multimedia, Demonstration, Verbal cues, and Handouts Education comprehension: verbalized understanding, returned demonstration, and verbal cues required  HOME EXERCISE PROGRAM: Access Code: [email or web address] URL: [email or web address] Date: 08/08/2023 Prepared by: Narda Amber  Exercises - Seated Hamstring Stretch  - 2 x daily - 7 x weekly - 3 reps - 20-30 seconds hold - Sit to Stand with Counter Support  - 2 x daily - 7 x weekly - 2-3 sets - 5 reps - Standing Lumbar Extension at Wall - Forearms  - 1 x daily - 7 x weekly - 3 sets - 10 reps - Gentle Levator Scapulae Stretch  - 2 x daily - 7 x weekly - 3 reps - 10 seconds hold  ASSESSMENT:  CLINICAL IMPRESSION: Aquatic PT session today for spinal decompression with good overall tolerance to this. Plan to continue with PT until she meets with MD about MRI results.   OBJECTIVE IMPAIRMENTS: decreased mobility, difficulty walking, decreased ROM, decreased strength, and pain.   ACTIVITY LIMITATIONS: lifting, bending, sitting, standing, squatting,  and sleeping  PARTICIPATION LIMITATIONS: cleaning, laundry, driving, and community activity, occupation, sleeping, transfers, cooking, and ADL's  PERSONAL FACTORS: 3+ comorbidities: see pertinent history  are also affecting patient's functional outcome.   REHAB POTENTIAL: Fair due to multiple areas to address  CLINICAL DECISION MAKING: Evolving/moderate complexity  EVALUATION COMPLEXITY: Moderate   GOALS: Goals reviewed with patient? Yes  SHORT TERM GOALS: (target date for Short term goals are 3 weeks 08/31/23)  1. Patient will demonstrate independent use of home exercise program to maintain progress  from in clinic treatments.  Goal status: met 08/28/2023  LONG TERM GOALS: (target dates for all long term goals are 10 weeks  10/19/23 )   1. Patient will demonstrate/report pain at worst less than or equal to 2/10 to facilitate minimal limitation in daily activity secondary to pain symptoms.  Goal status: on going 10/09/23   2. Patient will demonstrate independent use of home exercise program to facilitate ability to maintain/progress functional gains from skilled physical therapy services.  Goal status:  on going 10/09/23   3. Patient will demonstrate FOTO outcome > or = 46 % to indicate reduced disability due to condition.  Goal status:  on going 09/25/23   4. Patient will improve her 5 time sit to stand to </= 30 seconds with/without UE support.   Goal status:  MET 10/23/23   5.  Pt  will be able to navigate 5 steps with single hand rail with step over step gait pattern.  Goal status:  on going 10/09/23   6.  Pt will improve her bilateral cervical rotation to >/= 50 degrees to improved functional mobility.  Goal status:  on going 10/09/23  7. Pt will be able to tolerate sitting for up to 20 minutes with pain </= 3/10 for improved driving tolerance.  Goal Status:  on going 10/09/23     PLAN:  PT FREQUENCY: 1-2x/week  PT DURATION: 10 weeks  PLANNED INTERVENTIONS: Therapeutic exercises, Therapeutic activity, Neuro Muscular re-education, Balance training, Gait training, Patient/Family education, Joint mobilization, Stair training, DME instructions, Dry Needling, Electrical stimulation, Cryotherapy, vasopneumatic device, Moist heat, Taping, Traction Ultrasound, Ionotophoresis 4mg /ml Dexamethasone, and aquatic therapy, Manual therapy.  All included unless contraindicated  PLAN FOR NEXT SESSION:    Continue activity tolerance improvements.  Percussive device and  E-stim as needed/desired Continue land and aquatic therapy

## 2023-11-06 ENCOUNTER — Ambulatory Visit (INDEPENDENT_AMBULATORY_CARE_PROVIDER_SITE_OTHER): Payer: BC Managed Care – PPO | Admitting: Physical Therapy

## 2023-11-06 ENCOUNTER — Encounter: Payer: Self-pay | Admitting: Physical Therapy

## 2023-11-06 DIAGNOSIS — R262 Difficulty in walking, not elsewhere classified: Secondary | ICD-10-CM | POA: Diagnosis not present

## 2023-11-06 DIAGNOSIS — M5459 Other low back pain: Secondary | ICD-10-CM | POA: Diagnosis not present

## 2023-11-06 DIAGNOSIS — M542 Cervicalgia: Secondary | ICD-10-CM

## 2023-11-06 DIAGNOSIS — M546 Pain in thoracic spine: Secondary | ICD-10-CM

## 2023-11-06 DIAGNOSIS — M6281 Muscle weakness (generalized): Secondary | ICD-10-CM

## 2023-11-06 NOTE — Addendum Note (Signed)
Addended by: Narda Amber R on: 11/06/2023 01:07 PM   Modules accepted: Orders

## 2023-11-06 NOTE — Therapy (Signed)
 OUTPATIENT PHYSICAL THERAPY  TREATMENT   Patient Name: Morgan Molina MRN: [id number] DOB:07/16/1976, 47 y.o., female Today's Date: 11/06/2023   END OF SESSION:  PT End of Session - 11/06/23 0810     Visit Number 16    Number of Visits 20    Date for PT Re-Evaluation 12/14/23    PT Start Time 0802    PT Stop Time 0840    PT Time Calculation (min) 38 min    Activity Tolerance Patient tolerated treatment well    Behavior During Therapy [facility] for tasks assessed/performed                Past Medical History:  Diagnosis Date   Anemia    Anxiety    Arthritis    Depression    Dyspnea    Headache    Hypertension    Muscle spasm    PTSD (post-traumatic stress disorder) 09/28/2019   Syncope    Past Surgical History:  Procedure Laterality Date   ANTERIOR CERVICAL DECOMP/DISCECTOMY FUSION N/A 10/04/2022   Procedure: CERVICAL THREE-FOUR, CERVICAL FOUR-FIVE, CERVICAL FIVE-SIX, CERVICAL SIX-SEVEN, ANTERIOR CERVICAL DISCECTOMY FUSION, ALLOGRAFT, PLATE;  Surgeon: Barbarann Oneil BROCKS, MD;  Location: [facility];  Service: Orthopedics;  Laterality: N/A;   BREAST EXCISIONAL BIOPSY Right    CARPAL TUNNEL RELEASE Left 02/20/2020   Procedure: LEFT CARPAL TUNNEL RELEASE;  Surgeon: Murrell Drivers, MD;  Location: [facility];  Service: Orthopedics;  Laterality: Left;   CARPAL TUNNEL RELEASE Right 02/28/2021   Procedure: CARPAL TUNNEL RELEASE;  Surgeon: Murrell Drivers, MD;  Location: [facility];  Service: Orthopedics;  Laterality: Right;   CHOLECYSTECTOMY     GANGLION CYST EXCISION Left 02/20/2020   Procedure: LEFT DORSAL GANGLION EXCISION;  Surgeon: Murrell Drivers, MD;  Location: [facility];  Service: Orthopedics;  Laterality: Left;   TRIGGER FINGER RELEASE Bilateral 08/29/2021   Procedure: RELEASE TRIGGER FINGER/A-1 PULLEY BILATERAL THUMBS;  Surgeon: Murrell Drivers, MD;  Location: [facility];  Service: Orthopedics;  Laterality: Bilateral;  30 MIN    TUBAL LIGATION     Patient Active Problem List   Diagnosis Date Noted   Status post cervical spinal fusion 07/13/2023   Spinal stenosis of lumbar region 07/13/2023   Cervical spinal stenosis 10/04/2022   Chest pain 03/17/2022   Unstable angina (HCC) 03/15/2022   Paresthesias 06/23/2021   Neck pain 06/23/2021   Alteration consciousness 06/23/2021   Anxiety 06/23/2021   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   Arthralgia 03/30/2021   Hyperglycemia 03/30/2021   Shortness of breath 03/30/2021   Bilateral chronic serous otitis media 08/15/2020   Eustachian tube dysfunction, bilateral 08/15/2020   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   Ganglion cyst of dorsum of left wrist 10/01/2019   Decreased visual acuity 10/01/2019   PTSD (post-traumatic stress disorder) 09/28/2019   Nipple discharge 04/02/2019   Panic attacks 02/19/2019   Bilateral low back pain 02/19/2019   Seasonal allergies 02/19/2019   Anxiety and depression 01/14/2019   Syncope 01/14/2019   Breast mass, right 01/14/2019   Hypertension     PCP: Celestia Rosaline SQUIBB, NP   REFERRING PROVIDER: Barbarann Oneil BROCKS, MD   REFERRING DIAG:  Diagnosis  M54.42,M54.41,G89.29 (ICD-10-CM) - Chronic bilateral low back pain with bilateral sciatica    Rationale for Evaluation and Treatment: Rehabilitation  THERAPY DIAG:  Other low back pain  Pain in thoracic spine  Cervicalgia  Difficulty in walking, not elsewhere classified  Muscle weakness (generalized)  ONSET DATE: over a year  SUBJECTIVE:                                                                                                                                                                                           SUBJECTIVE STATEMENT: Pt arriving today reporting last week was a bad week. Pt reporting 7/10 pain in her Rt side neck  PERTINENT HISTORY:  4 level cervical fusion November 2023, recent spinal epidural, anxiety, PTSD, carpal tunnel, unstable angina,  depression, arthritis, dyspnea, HA, muscle spasm  PAIN:  NPRS scale:  7/10 upon arrival Pain location: low back and neck Pain description: achy, throbbing Aggravating factors: basic activities prolonged standing, house activity  Relieving factors: nothing seems to help  PRECAUTIONS: None  WEIGHT BEARING RESTRICTIONS: No  FALLS:  Has patient fallen in last 6 months? No  LIVING ENVIRONMENT: Lives with: lives with their family and lives with their spouse Lives in: House/apartment Stairs: Yes: External: 5 steps; on left going up Has following equipment at home: None  OCCUPATION: not currently working  PLOF: Independent  PATIENT GOALS: Be able to move better, be able to cook a full meal. Be able to perform ADL's and household chores. Be able to drive more than 10 minutes at a time.     OBJECTIVE:   DIAGNOSTIC FINDINGS:  MRI scan showed moderate stenosis L2-3 mild to moderate at 3 4 facet arthropathy bilateral at L2 bilateral L5-S1 and worse on the right at L4-5.  IMPRESSION: 1. Multifactorial degenerative changes at L2-3 with resultant moderate canal and bilateral L2 foraminal stenosis. 2. Right foraminal to extraforaminal disc protrusions at L3-4 and L4-5, contacting and potentially affecting the exiting right L3 and L4 nerve roots respectively. 3. Moderate to severe lower lumbar facet arthrosis, most pronounced at L4-5 on the right and L5-S1 on the left. Findings could contribute to lower back pain.  PATIENT SURVEYS:   11/06/23: FOTO update 29% 09/11/23: FOTO: 32% 08/08/23: FOTO eval:  25%   SCREENING FOR RED FLAGS: 08/08/23 Bowel or bladder incontinence: No Cauda equina syndrome: No  COGNITION: 08/08/23 Overall cognitive status: WFL normal      SENSATION: 08/08/23 WFL  POSTURE:  08/08/23 rounded shoulders and forward head, increased lumbar lordosis  PALPATION: 08/08/23 TTP: cervical, throacic and lumbar paraspinals, bil upper traps, bil QL, bil  iliosacral joint line  ROM:   LUMBAR AROM 08/08/23 08/28/2023 10/09/23 11/06/23  Flexion Finger tips to top of knees  Finger tips to top of knees Finger tips to top of knees  Extension 0, unable 25% WFL, moved to 50 % c repeated x 5 in standing.  End range pain noted 12 degrees 14   Right lateral flexion 16  14 14   Left lateral flexion 15  10 10   Right rotation Limited 80%  Limited 50% Limited 50%  Left rotation limited 80%  Limited 75% Imited 75%   (Blank rows = not tested)  CERVICAL AROM 08/08/23 08/28/2023 09/25/23 10/09/23 11/06/23  Flexion 10 22 24 15 12  c pain today  Extension 12 25 25 16 15   Right lateral flexion 14      Left lateral flexion 11      Right rotation 25 35 40 25 14 c pain today  Left rotation       32          45         45         24          26    LOWER EXTREMITY ROM:       Right 08/08/23 Left 08/08/23 Rt 10/09/23 Left 10/09/23  Hip flexion 80 76 90 92  Hip extension      Hip abduction      Hip adduction      Hip internal rotation      Hip external rotation      Knee flexion      Knee extension       (Blank rows = not tested)  LOWER EXTREMITY MMT:    MMT Right 08/08/23 Left 08/08/23 Rt / Left 10/09/23 11/06/23 Rt / Left  Hip flexion 3- 3- 4 / 4  4 / 4+  Hip extension      Hip abduction 3 3 4  / 4  4 / 4+  Hip adduction 3 3 4  / 4 4+ / 5  Hip internal rotation      Hip external rotation      Knee flexion 4 4 5  / 5  5 / 5  Knee extension 4 4 5  / 5  5 / 5  Cervical extension strength 15.6 pounds with hand-held dynamometer   Held this visit due to increased neck pain   (Blank rows = not tested)  LUMBAR SPECIAL TESTS:  08/08/23 Slump test: Negative bilateral pt reporting more hamstring tightness bilaterally  FUNCTIONAL TESTS:  08/08/23 5 times sit to stand: 90 seconds c UE support 10/09/23:  5 time sit to stand: 37.8 seconds UE support 10/23/23: 5 time sit to stand: 24.8 seconds UE support  GAIT: 08/08/23 Distance walked: clinic distances  level surface Assistive device utilized: None Level of assistance: Complete Independence Comments: wide BOS, antalgic gait  TODAY'S TREATMENT:                                                                         DATE: 10/23/23 TherEx:  Nustep Level 5 x 8 minutes Standing hamstring curls x 15 bil LE's c UE support Standing hip extension attempted, pt still reporting increased low back pain Standing calf raises x 20 c UE support Standing on Airex beam tandem stance x 3 each LE holding 30 sec with intermittent finger tap support  Leg Press: 75# bil LE's 2 x 15      DATE: 10/26/23 Pt seen for aquatic therapy today.  Treatment took place in water 3.5-4.75 ft in depth at the [facility] pool. Temp of water was 91.  Pt entered/exited the pool via stairs with hand rail.   Pt requires the buoyancy and hydrostatic pressure of water for support, and to offload joints by unweighting joint load by at least 50 % in navel deep water and by at least 75-80% in chest to neck deep water.  Viscosity of the water is needed for resistance of strengthening. Water current perturbations provides challenge to standing balance requiring increased core activation. Aquatic PT exercises Forward walking and backward walking 3 round trips, began with floating bar then progressed to no UE support Sidestepping 4 round trips Tandem walk 2 round trips with floating bar Lumbar flexion L stretch 5 sec X 10 holding at pool wall Leg swings hip abd/add X 20 bilat with UE support with UE support Leg swings hip flexion/extension X 20 bilat Marches X 15 bilat Monster walks 3 round trips Seated on noodle bicycle X 5 min for spinal decompression  DATE: 10/23/23 TherEx:  Recumbent bike: Level 4 x 7  minutes Mini wall squats with red physio ball behind pt's back 2 x 10 Standing trunk extension: x 10 holding 10 sec Standing rows: green TB 2 x 10 holding 3 sec Standing hip extension: 2 x 10 bil LE c UE support Supine marching x 20 c core activation Supine SLR: 2 x 10 bil LE Trunk rotation: x 2 bil holding 30 sec         PATIENT EDUCATION:  Education details: HEP, POC Person educated: Patient Education method: Programmer, Multimedia, Demonstration, Verbal cues, and Handouts Education comprehension: verbalized understanding, returned demonstration, and verbal cues required  HOME EXERCISE PROGRAM: Access Code: [email or web address] URL: [email or web address] Date: 08/08/2023 Prepared by: Delon Lunger  Exercises - Seated Hamstring Stretch  - 2 x daily - 7 x weekly - 3 reps - 20-30 seconds hold - Sit to Stand with Counter Support  - 2 x daily - 7 x weekly - 2-3 sets - 5 reps - Standing Lumbar Extension at Wall - Forearms  - 1 x daily - 7 x weekly - 3 sets - 10 reps - Gentle Levator Scapulae Stretch  - 2 x daily - 7 x weekly - 3 reps - 10 seconds hold  ASSESSMENT:  CLINICAL IMPRESSION: Pt presenting with pain in her lumbar and cervical spine. Due to having an episode of increased pain over the Christmas Holiday. Pt with limited ROM today especially with Rt cervical rotation (see chart above). Pt feels she receives most benefit from aquatic therapy and is able to  move more freely in the water. We are adjusting her schedule to have back to back aquatic sessions followed by land session.   OBJECTIVE IMPAIRMENTS: decreased mobility, difficulty walking, decreased ROM, decreased strength, and pain.   ACTIVITY LIMITATIONS: lifting, bending, sitting, standing, squatting, and sleeping  PARTICIPATION LIMITATIONS: cleaning, laundry, driving, and community activity, occupation, sleeping, transfers, cooking, and ADL's  PERSONAL FACTORS: 3+ comorbidities: see pertinent history  are also affecting  patient's functional outcome.   REHAB POTENTIAL: Fair due to multiple areas to address  CLINICAL DECISION MAKING: Evolving/moderate complexity  EVALUATION COMPLEXITY: Moderate   GOALS: Goals reviewed with patient? Yes  SHORT TERM GOALS: (target date for Short term goals are 3 weeks 08/31/23)  1. Patient will demonstrate independent use of home exercise program to maintain progress from in clinic treatments.  Goal status: met 08/28/2023  LONG TERM GOALS: (target dates for all long term goals are 10 weeks  12/14/23 )   1. Patient will demonstrate/report pain at worst less than or equal to 2/10 to facilitate minimal limitation in daily activity secondary to pain symptoms.  Goal status: on going 10/23/23   2. Patient will demonstrate independent use of home exercise program to facilitate ability to maintain/progress functional gains from skilled physical therapy services.  Goal status:  on going 10/23/23   3. Patient will demonstrate FOTO outcome > or = 46 % to indicate reduced disability due to condition.  Goal status:  on going 10/23/23   4. Patient will improve her 5 time sit to stand to </= 30 seconds with/without UE support.   Goal status:  MET 10/23/23   5.  Pt  will be able to navigate 5 steps with single hand rail with step over step gait pattern.  Goal status:  on going 10/23/23   6.  Pt will improve her bilateral cervical rotation to >/= 50 degrees to improved functional mobility.  Goal status:  on going 10/23/23  7. Pt will be able to tolerate sitting for up to 20 minutes with pain </= 3/10 for improved driving tolerance.  Goal Status:  on going 10/23/23     PLAN:  PT FREQUENCY: 1-2x/week  PT DURATION: 8 weeks  PLANNED INTERVENTIONS: Therapeutic exercises, Therapeutic activity, Neuro Muscular re-education, Balance training, Gait training, Patient/Family education, Joint mobilization, Stair training, DME instructions, Dry Needling, Electrical stimulation,  Cryotherapy, vasopneumatic device, Moist heat, Taping, Traction Ultrasound, Ionotophoresis 4mg /ml Dexamethasone , and aquatic therapy, Manual therapy.  All included unless contraindicated  PLAN FOR NEXT SESSION:   Continue activity tolerance improvements for posture and core strengthening Continue land and aquatic therapy

## 2023-11-09 ENCOUNTER — Encounter: Payer: BC Managed Care – PPO | Admitting: Physical Therapy

## 2023-11-09 ENCOUNTER — Encounter: Payer: Self-pay | Admitting: Physical Therapy

## 2023-11-09 ENCOUNTER — Ambulatory Visit (INDEPENDENT_AMBULATORY_CARE_PROVIDER_SITE_OTHER): Payer: BC Managed Care – PPO | Admitting: Physical Therapy

## 2023-11-09 DIAGNOSIS — R262 Difficulty in walking, not elsewhere classified: Secondary | ICD-10-CM

## 2023-11-09 DIAGNOSIS — M6281 Muscle weakness (generalized): Secondary | ICD-10-CM

## 2023-11-09 DIAGNOSIS — M542 Cervicalgia: Secondary | ICD-10-CM

## 2023-11-09 DIAGNOSIS — M546 Pain in thoracic spine: Secondary | ICD-10-CM | POA: Diagnosis not present

## 2023-11-09 DIAGNOSIS — M5459 Other low back pain: Secondary | ICD-10-CM

## 2023-11-09 NOTE — Therapy (Signed)
 OUTPATIENT PHYSICAL THERAPY  TREATMENT   Patient Name: Morgan Molina MRN: [id number] DOB:02-27-1976, 48 y.o., female Today's Date: 11/09/2023   END OF SESSION:  PT End of Session - 11/09/23 0853     Visit Number 17    Number of Visits 20    Date for PT Re-Evaluation 12/14/23    PT Start Time 0840    PT Stop Time 0920    PT Time Calculation (min) 40 min    Activity Tolerance Patient tolerated treatment well    Behavior During Therapy [facility] for tasks assessed/performed                Past Medical History:  Diagnosis Date   Anemia    Anxiety    Arthritis    Depression    Dyspnea    Headache    Hypertension    Muscle spasm    PTSD (post-traumatic stress disorder) 09/28/2019   Syncope    Past Surgical History:  Procedure Laterality Date   ANTERIOR CERVICAL DECOMP/DISCECTOMY FUSION N/A 10/04/2022   Procedure: CERVICAL THREE-FOUR, CERVICAL FOUR-FIVE, CERVICAL FIVE-SIX, CERVICAL SIX-SEVEN, ANTERIOR CERVICAL DISCECTOMY FUSION, ALLOGRAFT, PLATE;  Surgeon: Barbarann Oneil BROCKS, MD;  Location: [facility];  Service: Orthopedics;  Laterality: N/A;   BREAST EXCISIONAL BIOPSY Right    CARPAL TUNNEL RELEASE Left 02/20/2020   Procedure: LEFT CARPAL TUNNEL RELEASE;  Surgeon: Murrell Drivers, MD;  Location: [facility];  Service: Orthopedics;  Laterality: Left;   CARPAL TUNNEL RELEASE Right 02/28/2021   Procedure: CARPAL TUNNEL RELEASE;  Surgeon: Murrell Drivers, MD;  Location: [facility];  Service: Orthopedics;  Laterality: Right;   CHOLECYSTECTOMY     GANGLION CYST EXCISION Left 02/20/2020   Procedure: LEFT DORSAL GANGLION EXCISION;  Surgeon: Murrell Drivers, MD;  Location: [facility];  Service: Orthopedics;  Laterality: Left;   TRIGGER FINGER RELEASE Bilateral 08/29/2021   Procedure: RELEASE TRIGGER FINGER/A-1 PULLEY BILATERAL THUMBS;  Surgeon: Murrell Drivers, MD;  Location: [facility];  Service: Orthopedics;  Laterality: Bilateral;  30 MIN    TUBAL LIGATION     Patient Active Problem List   Diagnosis Date Noted   Status post cervical spinal fusion 07/13/2023   Spinal stenosis of lumbar region 07/13/2023   Cervical spinal stenosis 10/04/2022   Chest pain 03/17/2022   Unstable angina (HCC) 03/15/2022   Paresthesias 06/23/2021   Neck pain 06/23/2021   Alteration consciousness 06/23/2021   Anxiety 06/23/2021   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   Arthralgia 03/30/2021   Hyperglycemia 03/30/2021   Shortness of breath 03/30/2021   Bilateral chronic serous otitis media 08/15/2020   Eustachian tube dysfunction, bilateral 08/15/2020   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   Ganglion cyst of dorsum of left wrist 10/01/2019   Decreased visual acuity 10/01/2019   PTSD (post-traumatic stress disorder) 09/28/2019   Nipple discharge 04/02/2019   Panic attacks 02/19/2019   Bilateral low back pain 02/19/2019   Seasonal allergies 02/19/2019   Anxiety and depression 01/14/2019   Syncope 01/14/2019   Breast mass, right 01/14/2019   Hypertension     PCP: Celestia Rosaline SQUIBB, NP   REFERRING PROVIDER: Barbarann Oneil BROCKS, MD   REFERRING DIAG:  Diagnosis  M54.42,M54.41,G89.29 (ICD-10-CM) - Chronic bilateral low back pain with bilateral sciatica    Rationale for Evaluation and Treatment: Rehabilitation  THERAPY DIAG:  Other low back pain  Pain in thoracic spine  Cervicalgia  Difficulty in walking, not elsewhere classified  Muscle weakness (generalized)  ONSET DATE: over a year  SUBJECTIVE:                                                                                                                                                                                           SUBJECTIVE STATEMENT: She has about 7/10 pain constantly all over  PERTINENT HISTORY:  4 level cervical fusion November 2023, recent spinal epidural, anxiety, PTSD, carpal tunnel, unstable angina, depression, arthritis, dyspnea, HA, muscle  spasm  PAIN:  NPRS scale:  7/10 upon arrival Pain location: low back and neck Pain description: achy, throbbing Aggravating factors: basic activities prolonged standing, house activity  Relieving factors: nothing seems to help  PRECAUTIONS: None  WEIGHT BEARING RESTRICTIONS: No  FALLS:  Has patient fallen in last 6 months? No  LIVING ENVIRONMENT: Lives with: lives with their family and lives with their spouse Lives in: House/apartment Stairs: Yes: External: 5 steps; on left going up Has following equipment at home: None  OCCUPATION: not currently working  PLOF: Independent  PATIENT GOALS: Be able to move better, be able to cook a full meal. Be able to perform ADL's and household chores. Be able to drive more than 10 minutes at a time.     OBJECTIVE:   DIAGNOSTIC FINDINGS:  MRI scan showed moderate stenosis L2-3 mild to moderate at 3 4 facet arthropathy bilateral at L2 bilateral L5-S1 and worse on the right at L4-5.  IMPRESSION: 1. Multifactorial degenerative changes at L2-3 with resultant moderate canal and bilateral L2 foraminal stenosis. 2. Right foraminal to extraforaminal disc protrusions at L3-4 and L4-5, contacting and potentially affecting the exiting right L3 and L4 nerve roots respectively. 3. Moderate to severe lower lumbar facet arthrosis, most pronounced at L4-5 on the right and L5-S1 on the left. Findings could contribute to lower back pain.  PATIENT SURVEYS:   11/06/23: FOTO update 29% 09/11/23: FOTO: 32% 08/08/23: FOTO eval:  25%   SCREENING FOR RED FLAGS: 08/08/23 Bowel or bladder incontinence: No Cauda equina syndrome: No  COGNITION: 08/08/23 Overall cognitive status: WFL normal      SENSATION: 08/08/23 WFL  POSTURE:  08/08/23 rounded shoulders and forward head, increased lumbar lordosis  PALPATION: 08/08/23 TTP: cervical, throacic and lumbar paraspinals, bil upper traps, bil QL, bil iliosacral joint line  ROM:   LUMBAR AROM  08/08/23 08/28/2023 10/09/23 11/06/23  Flexion Finger tips to top of knees  Finger tips to top of knees Finger tips to top of knees  Extension 0, unable 25% WFL, moved to 50 % c repeated x 5 in standing.   End range pain noted 12 degrees 14   Right lateral  flexion 16  14 14   Left lateral flexion 15  10 10   Right rotation Limited 80%  Limited 50% Limited 50%  Left rotation limited 80%  Limited 75% Imited 75%   (Blank rows = not tested)  CERVICAL AROM 08/08/23 08/28/2023 09/25/23 10/09/23 11/06/23  Flexion 10 22 24 15 12  c pain today  Extension 12 25 25 16 15   Right lateral flexion 14      Left lateral flexion 11      Right rotation 25 35 40 25 14 c pain today  Left rotation       32          45         45         24          26    LOWER EXTREMITY ROM:       Right 08/08/23 Left 08/08/23 Rt 10/09/23 Left 10/09/23  Hip flexion 80 76 90 92  Hip extension      Hip abduction      Hip adduction      Hip internal rotation      Hip external rotation      Knee flexion      Knee extension       (Blank rows = not tested)  LOWER EXTREMITY MMT:    MMT Right 08/08/23 Left 08/08/23 Rt / Left 10/09/23 11/06/23 Rt / Left  Hip flexion 3- 3- 4 / 4  4 / 4+  Hip extension      Hip abduction 3 3 4  / 4  4 / 4+  Hip adduction 3 3 4  / 4 4+ / 5  Hip internal rotation      Hip external rotation      Knee flexion 4 4 5  / 5  5 / 5  Knee extension 4 4 5  / 5  5 / 5  Cervical extension strength 15.6 pounds with hand-held dynamometer   Held this visit due to increased neck pain   (Blank rows = not tested)  LUMBAR SPECIAL TESTS:  08/08/23 Slump test: Negative bilateral pt reporting more hamstring tightness bilaterally  FUNCTIONAL TESTS:  08/08/23 5 times sit to stand: 90 seconds c UE support 10/09/23:  5 time sit to stand: 37.8 seconds UE support 10/23/23: 5 time sit to stand: 24.8 seconds UE support  GAIT: 08/08/23 Distance walked: clinic distances level surface Assistive device utilized:  None Level of assistance: Complete Independence Comments: wide BOS, antalgic gait  TODAY'S TREATMENT:                                                                          DATE: 11/09/23 Pt seen for aquatic therapy today.  Treatment took place in water 3.5-4.75 ft in depth at the [facility] pool. Temp of water was 91.  Pt entered/exited the pool via stairs with hand rail.   Pt requires the buoyancy and hydrostatic pressure of water for support, and to offload joints by unweighting joint load by at least 50 % in navel deep water and by at least 75-80% in chest to neck deep water.  Viscosity of the water is needed for resistance of strengthening. Water current perturbations provides challenge to standing balance requiring increased core activation. Aquatic PT exercises Forward walking and backward walking 3 round trips, no UE support Sidestepping 4 round trips with shoulder circles  Tandem walk 2 round trips with UE support of noodle, then 1 trip without noodle Lumbar flexion L stretch 5 sec X 10 holding at pool wall Leg swings hip abd/add 2X15 bilat with UE support with UE support Leg swings hip flexion/extension X 20 bilat Marches X 15 bilat Shoulder horizontal adduction/abduction X 15 bilat with water dumbells Shoulder push/pull X 15 bilat with water dumbells Stir the pot with single water dumbell X 15 Clockwise, X 15 CCW. Monster walks 3 round trips Seated on noodle bicycle X 5 min for spinal decompression     DATE: 10/23/23 TherEx:  Nustep Level 5 x 8 minutes Standing hamstring curls x 15 bil LE's c UE support Standing hip extension attempted, pt still reporting increased low back pain Standing calf raises x 20 c UE support Standing on Airex beam tandem stance  x 3 each LE holding 30 sec with intermittent finger tap support  Leg Press: 75# bil LE's 2 x 15      PATIENT EDUCATION:  Education details: HEP, POC Person educated: Patient Education method: Programmer, Multimedia, Demonstration, Verbal cues, and Handouts Education comprehension: verbalized understanding, returned demonstration, and verbal cues required  HOME EXERCISE PROGRAM: Access Code: [email or web address] URL: [email or web address] Date: 08/08/2023 Prepared by: Delon Lunger  Exercises - Seated Hamstring Stretch  - 2 x daily - 7 x weekly - 3 reps - 20-30 seconds hold - Sit to Stand with Counter Support  - 2 x daily - 7 x weekly - 2-3 sets - 5 reps - Standing Lumbar Extension at Wall - Forearms  - 1 x daily - 7 x weekly - 3 sets - 10 reps - Gentle Levator Scapulae Stretch  - 2 x daily - 7 x weekly - 3 reps - 10 seconds hold  ASSESSMENT:  CLINICAL IMPRESSION: She did good in her aquatic PT session today but still having high amounts of pain overall. Did discuss role of diet, sleep, mental health and lifestyle and how it all can contribute to chronic pain.   OBJECTIVE IMPAIRMENTS: decreased mobility, difficulty walking, decreased ROM, decreased strength, and pain.   ACTIVITY LIMITATIONS: lifting, bending, sitting, standing, squatting, and sleeping  PARTICIPATION LIMITATIONS: cleaning, laundry, driving, and community activity, occupation, sleeping, transfers, cooking, and ADL's  PERSONAL FACTORS: 3+ comorbidities: see pertinent history  are also affecting patient's functional outcome.   REHAB  POTENTIAL: Fair due to multiple areas to address  CLINICAL DECISION MAKING: Evolving/moderate complexity  EVALUATION COMPLEXITY: Moderate   GOALS: Goals reviewed with patient? Yes  SHORT TERM GOALS: (target date for Short term goals are 3 weeks 08/31/23)  1. Patient will demonstrate independent use of home exercise program to maintain progress from in clinic treatments.  Goal  status: met 08/28/2023  LONG TERM GOALS: (target dates for all long term goals are 10 weeks  12/14/23 )   1. Patient will demonstrate/report pain at worst less than or equal to 2/10 to facilitate minimal limitation in daily activity secondary to pain symptoms.  Goal status: on going 10/23/23   2. Patient will demonstrate independent use of home exercise program to facilitate ability to maintain/progress functional gains from skilled physical therapy services.  Goal status:  on going 10/23/23   3. Patient will demonstrate FOTO outcome > or = 46 % to indicate reduced disability due to condition.  Goal status:  on going 10/23/23   4. Patient will improve her 5 time sit to stand to </= 30 seconds with/without UE support.   Goal status:  MET 10/23/23   5.  Pt  will be able to navigate 5 steps with single hand rail with step over step gait pattern.  Goal status:  on going 10/23/23   6.  Pt will improve her bilateral cervical rotation to >/= 50 degrees to improved functional mobility.  Goal status:  on going 10/23/23  7. Pt will be able to tolerate sitting for up to 20 minutes with pain </= 3/10 for improved driving tolerance.  Goal Status:  on going 10/23/23     PLAN:  PT FREQUENCY: 1-2x/week  PT DURATION: 8 weeks  PLANNED INTERVENTIONS: Therapeutic exercises, Therapeutic activity, Neuro Muscular re-education, Balance training, Gait training, Patient/Family education, Joint mobilization, Stair training, DME instructions, Dry Needling, Electrical stimulation, Cryotherapy, vasopneumatic device, Moist heat, Taping, Traction Ultrasound, Ionotophoresis 4mg /ml Dexamethasone , and aquatic therapy, Manual therapy.  All included unless contraindicated  PLAN FOR NEXT SESSION:   Continue activity tolerance improvements for posture and core strengthening Continue land and aquatic therapy

## 2023-11-13 ENCOUNTER — Ambulatory Visit (INDEPENDENT_AMBULATORY_CARE_PROVIDER_SITE_OTHER): Payer: BC Managed Care – PPO | Admitting: Orthopaedic Surgery

## 2023-11-13 ENCOUNTER — Encounter: Payer: Self-pay | Admitting: Orthopaedic Surgery

## 2023-11-13 VITALS — BP 143/84 | HR 85 | Ht 63.0 in | Wt 276.0 lb

## 2023-11-13 DIAGNOSIS — M48061 Spinal stenosis, lumbar region without neurogenic claudication: Secondary | ICD-10-CM | POA: Diagnosis not present

## 2023-11-13 DIAGNOSIS — Z981 Arthrodesis status: Secondary | ICD-10-CM | POA: Diagnosis not present

## 2023-11-13 NOTE — Progress Notes (Signed)
 Office Visit Note   Patient: Morgan Molina           Date of Birth: 1976/01/05           MRN: [id number] Visit Date: 11/13/2023              Requested by: Celestia Rosaline SQUIBB, NP [address] [place],  [place] [address] PCP: Celestia Rosaline SQUIBB, NP   Assessment & Plan: Visit Diagnoses:  1. Spinal stenosis of lumbar region without neurogenic claudication   2. Status post cervical spinal fusion     Plan: Patient is lost 18 pounds and is congratulated.  She has progression of facet arthropathy L4-5 L5-S1.  Some lateral recess narrowing at L4-5.  Mild to moderate central stenosis at L2-3.  Mild stenosis L3-4.  Encourage continued weight loss to help unload her back.  At this point no lumbar surgery is recommended.  Gave her a copy of the report we discussed pathophysiology.  Follow-Up Instructions: Return if symptoms worsen or fail to improve.   Orders:  No orders of the defined types were placed in this encounter.  No orders of the defined types were placed in this encounter.     Procedures: No procedures performed   Clinical Data: No additional findings.   Subjective: Chief Complaint  Patient presents with   Neck - Follow-up, Pain   Lower Back - Pain, Follow-up    MRI review    HPI 48 year old female patient turns previous 4 level cervical fusion.  She states her neck is stiff does not turn but good relief of her arm numbness and hand tingling.  She has some discomfort with her shoulder.  Continue problems with back pain.  She states she uses ibuprofen  if she is having significant increase in pain.  He states with problems with her back and neck, PTSD, hyperglycemia she is not able to work.  BMI 48.  Persistent problems with ongoing back pain.  MRI 12//24 is reviewed, lumbar spine done at [facility].  Comparison previous MRI 10/02/2021.  Review of Systems all systems update noncontributory to HPI.   Objective: Vital Signs: BP (!) 143/84   Pulse  85   Ht 5' 3 (1.6 m)   Wt 276 lb (125.2 kg)   BMI 48.89 kg/m   Physical Exam  Ortho Exam  Specialty Comments:  MRI LUMBAR SPINE WITHOUT CONTRAST   TECHNIQUE: Multiplanar, multisequence MR imaging of the lumbar spine was performed. No intravenous contrast was administered.   COMPARISON:  Prior radiograph from 08/04/2021   FINDINGS: Segmentation: Standard. Lowest well-formed disc space labeled the L5-S1 level.   Alignment: Trace degenerative anterolisthesis of L3 on L4 and L4 on L5. Alignment otherwise normal preservation of the normal lumbar lordosis.   Vertebrae: Vertebral body height maintained without acute or chronic fracture. Bone marrow signal intensity diffusely decreased on T1 weighted imaging, nonspecific, but most commonly related to anemia, smoking, or obesity. Mild reactive endplate change present about the L2-3 interspace. Reactive marrow edema present about the right greater than left L4-5 facets due to facet arthritis. No other abnormal marrow edema.   Conus medullaris and cauda equina: Conus extends to the L1-2 level. Conus and cauda equina appear normal.   Paraspinal and other soft tissues: Paraspinous soft tissues demonstrate no acute finding. Visualized visceral structures unremarkable.   Disc levels:   T11-12: Seen only on sagittal projection. Mild disc bulge with disc desiccation. Bilateral facet hypertrophy. No significant spinal stenosis. Moderate bilateral  foraminal narrowing.   T12-L1: Normal interspace. Mild bilateral facet hypertrophy. No stenosis.   L1-2: Negative interspace. Mild bilateral facet hypertrophy. No stenosis.   L2-3: Degenerative intervertebral disc space narrowing with diffuse disc bulge and disc desiccation. Superimposed biforaminal disc protrusions, right slightly larger than left (series 6, image 15). Superimposed small left subarticular component noted on the left (series 6, image 16). Moderate bilateral facet  hypertrophy. Mild prominence of the dorsal epidural fat. Resultant moderate spinal stenosis. Moderate bilateral L2 foraminal narrowing.   L3-4: Trace listhesis. Diffuse disc bulge with disc desiccation. Superimposed right foraminal disc protrusion contacts the exiting right L3 nerve root (series 6, image 19). Moderate bilateral facet hypertrophy. Resultant mild spinal stenosis. Mild left with moderate right L3 foraminal narrowing.   L4-5: Right foraminal to extraforaminal disc protrusion contacts the exiting right L4 nerve root as it courses of the right neural foramen (series 6, image 24). Severe right worse than left facet arthrosis. No significant spinal stenosis. Mild left with moderate right L4 foraminal narrowing.   L5-S1: Negative interspace. Severe left with moderate right facet arthrosis. No significant spinal stenosis. Foramina remain patent.   IMPRESSION: 1. Multifactorial degenerative changes at L2-3 with resultant moderate canal and bilateral L2 foraminal stenosis. 2. Right foraminal to extraforaminal disc protrusions at L3-4 and L4-5, contacting and potentially affecting the exiting right L3 and L4 nerve roots respectively. 3. Moderate to severe lower lumbar facet arthrosis, most pronounced at L4-5 on the right and L5-S1 on the left. Findings could contribute to lower back pain.     Electronically Signed   By: Morene Hoard M.D.   On: 10/03/2021 01:47  Imaging: No results found.   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   Arthralgia 03/30/2021   Hyperglycemia 03/30/2021   Shortness of breath  03/30/2021   Bilateral chronic serous otitis media 08/15/2020   Eustachian tube dysfunction, bilateral 08/15/2020   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   Ganglion cyst of dorsum of left wrist 10/01/2019   Decreased visual acuity 10/01/2019   PTSD (post-traumatic stress disorder) 09/28/2019   Nipple discharge 04/02/2019   Panic attacks 02/19/2019   Bilateral low back pain 02/19/2019   Seasonal allergies 02/19/2019   Anxiety and depression 01/14/2019   Syncope 01/14/2019   Breast mass, right 01/14/2019   Hypertension    Past Medical History:  Diagnosis Date   Anemia    Anxiety    Arthritis    Depression    Dyspnea    Headache    Hypertension    Muscle spasm    PTSD (post-traumatic stress disorder) 09/28/2019   Syncope     Family History  Problem Relation Age of Onset   Cancer Mother    Hypertension Mother    Heart attack Mother    Breast cancer Mother 37   CAD Father    Hypertension Father    Diabetes Father     Past Surgical History:  Procedure Laterality Date   ANTERIOR CERVICAL DECOMP/DISCECTOMY FUSION N/A 10/04/2022   Procedure: CERVICAL THREE-FOUR, CERVICAL FOUR-FIVE, CERVICAL FIVE-SIX, CERVICAL SIX-SEVEN, ANTERIOR CERVICAL DISCECTOMY FUSION, ALLOGRAFT, PLATE;  Surgeon: Barbarann Oneil BROCKS,  MD;  Location: [facility] OR;  Service: Orthopedics;  Laterality: N/A;   BREAST EXCISIONAL BIOPSY Right    CARPAL TUNNEL RELEASE Left 02/20/2020   Procedure: LEFT CARPAL TUNNEL RELEASE;  Surgeon: Murrell Drivers, MD;  Location: [facility];  Service: Orthopedics;  Laterality: Left;   CARPAL TUNNEL RELEASE Right 02/28/2021   Procedure: CARPAL TUNNEL RELEASE;  Surgeon: Murrell Drivers, MD;  Location: [facility];  Service: Orthopedics;  Laterality: Right;   CHOLECYSTECTOMY     GANGLION CYST EXCISION Left 02/20/2020   Procedure: LEFT DORSAL GANGLION EXCISION;  Surgeon: Murrell Drivers, MD;  Location: [facility];  Service: Orthopedics;  Laterality:  Left;   TRIGGER FINGER RELEASE Bilateral 08/29/2021   Procedure: RELEASE TRIGGER FINGER/A-1 PULLEY BILATERAL THUMBS;  Surgeon: Murrell Drivers, MD;  Location: [facility];  Service: Orthopedics;  Laterality: Bilateral;  30 MIN   TUBAL LIGATION     Social History   Occupational History   Occupation: Clinical Biochemist  Tobacco Use   Smoking status: Former    Current packs/day: 0.00    Types: Cigarettes    Quit date: 10/26/2019    Years since quitting: 4.0   Smokeless tobacco: Never  Vaping Use   Vaping status: Never Used  Substance and Sexual Activity   Alcohol use: Yes    Comment: occasionally   Drug use: Yes    Types: Marijuana    Comment: occasionally   Sexual activity: Yes    Birth control/protection: Surgical

## 2023-11-23 ENCOUNTER — Ambulatory Visit (INDEPENDENT_AMBULATORY_CARE_PROVIDER_SITE_OTHER): Payer: BC Managed Care – PPO | Admitting: Physical Therapy

## 2023-11-23 ENCOUNTER — Encounter: Payer: Self-pay | Admitting: Physical Therapy

## 2023-11-23 DIAGNOSIS — M546 Pain in thoracic spine: Secondary | ICD-10-CM | POA: Diagnosis not present

## 2023-11-23 DIAGNOSIS — R262 Difficulty in walking, not elsewhere classified: Secondary | ICD-10-CM

## 2023-11-23 DIAGNOSIS — M542 Cervicalgia: Secondary | ICD-10-CM

## 2023-11-23 DIAGNOSIS — M5459 Other low back pain: Secondary | ICD-10-CM

## 2023-11-23 DIAGNOSIS — M6281 Muscle weakness (generalized): Secondary | ICD-10-CM

## 2023-11-23 NOTE — Therapy (Signed)
OUTPATIENT PHYSICAL THERAPY  TREATMENT   Patient Name: Morgan Molina MRN: [id number] DOB:[birth year], 48 y.o., female Today's Date: 11/23/2023   END OF SESSION:  PT End of Session - 11/23/23 0854     Visit Number 18    Number of Visits 20    Date for PT Re-Evaluation 12/14/23    Progress Note Due on Visit 20    PT Start Time 0845    PT Stop Time 0925    PT Time Calculation (min) 40 min    Activity Tolerance Patient tolerated treatment well    Behavior During Therapy [facility] for tasks assessed/performed                Past Medical History:  Diagnosis Date   Anemia    Anxiety    Arthritis    Depression    Dyspnea    Headache    Hypertension    Muscle spasm    PTSD (post-traumatic stress disorder) 09/28/2019   Syncope    Past Surgical History:  Procedure Laterality Date   ANTERIOR CERVICAL DECOMP/DISCECTOMY FUSION N/A 10/04/2022   Procedure: CERVICAL THREE-FOUR, CERVICAL FOUR-FIVE, CERVICAL FIVE-SIX, CERVICAL SIX-SEVEN, ANTERIOR CERVICAL DISCECTOMY FUSION, ALLOGRAFT, PLATE;  Surgeon: Eldred Manges, MD;  Location: [facility] OR;  Service: Orthopedics;  Laterality: N/A;   BREAST EXCISIONAL BIOPSY Right    CARPAL TUNNEL RELEASE Left 02/20/2020   Procedure: LEFT CARPAL TUNNEL RELEASE;  Surgeon: Betha Loa, MD;  Location: [facility];  Service: Orthopedics;  Laterality: Left;   CARPAL TUNNEL RELEASE Right 02/28/2021   Procedure: CARPAL TUNNEL RELEASE;  Surgeon: Betha Loa, MD;  Location: [facility];  Service: Orthopedics;  Laterality: Right;   CHOLECYSTECTOMY     GANGLION CYST EXCISION Left 02/20/2020   Procedure: LEFT DORSAL GANGLION EXCISION;  Surgeon: Betha Loa, MD;  Location: [facility];  Service: Orthopedics;  Laterality: Left;   TRIGGER FINGER RELEASE Bilateral 08/29/2021   Procedure: RELEASE TRIGGER FINGER/A-1 PULLEY BILATERAL THUMBS;  Surgeon: Betha Loa, MD;  Location: [facility];  Service: Orthopedics;   Laterality: Bilateral;  30 MIN   TUBAL LIGATION     Patient Active Problem List   Diagnosis Date Noted   Status post cervical spinal fusion 07/13/2023   Spinal stenosis of lumbar region 07/13/2023   Cervical spinal stenosis 10/04/2022   Chest pain 03/17/2022   Unstable angina (HCC) 03/15/2022   Paresthesias 06/23/2021   Neck pain 06/23/2021   Alteration consciousness 06/23/2021   Anxiety 06/23/2021   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   Arthralgia 03/30/2021   Hyperglycemia 03/30/2021   Shortness of breath 03/30/2021   Bilateral chronic serous otitis media 08/15/2020   Eustachian tube dysfunction, bilateral 08/15/2020   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   Ganglion cyst of dorsum of left wrist 10/01/2019   Decreased visual acuity 10/01/2019   PTSD (post-traumatic stress disorder) 09/28/2019   Nipple discharge 04/02/2019   Panic attacks 02/19/2019   Bilateral low back pain 02/19/2019   Seasonal allergies 02/19/2019   Anxiety and depression 01/14/2019   Syncope 01/14/2019   Breast mass, right 01/14/2019   Hypertension     PCP: Grayce Sessions, NP   REFERRING PROVIDER: Grayce Sessions, NP   REFERRING DIAG:  Diagnosis  M54.42,M54.41,G89.29 (ICD-10-CM) - Chronic bilateral low back pain with bilateral sciatica    Rationale for Evaluation and Treatment: Rehabilitation  THERAPY DIAG:  Other low back pain  Pain in thoracic spine  Cervicalgia  Difficulty in  walking, not elsewhere classified  Muscle weakness (generalized)  ONSET DATE: over a year  SUBJECTIVE:                                                                                                                                                                                           SUBJECTIVE STATEMENT: She complains of Rt hip/lumbar pain 6/10  PERTINENT HISTORY:  4 level cervical fusion November 2023, recent spinal epidural, anxiety, PTSD, carpal tunnel, unstable angina, depression,  arthritis, dyspnea, HA, muscle spasm  PAIN:  NPRS scale:  6/10 upon arrival Pain location: low back and neck Pain description: achy, throbbing Aggravating factors: "basic activities" prolonged standing, house activity  Relieving factors: nothing seems to help  PRECAUTIONS: None  WEIGHT BEARING RESTRICTIONS: No  FALLS:  Has patient fallen in last 6 months? No  LIVING ENVIRONMENT: Lives with: lives with their family and lives with their spouse Lives in: House/apartment Stairs: Yes: External: 5 steps; on left going up Has following equipment at home: None  OCCUPATION: not currently working  PLOF: Independent  PATIENT GOALS: Be able to move better, be able to cook a full meal. Be able to perform ADL's and household chores. Be able to drive more than 10 minutes at a time.     OBJECTIVE:   DIAGNOSTIC FINDINGS:  MRI scan showed moderate stenosis L2-3 mild to moderate at 3 4 facet arthropathy bilateral at L2 bilateral L5-S1 and worse on the right at L4-5.  IMPRESSION: 1. Multifactorial degenerative changes at L2-3 with resultant moderate canal and bilateral L2 foraminal stenosis. 2. Right foraminal to extraforaminal disc protrusions at L3-4 and L4-5, contacting and potentially affecting the exiting right L3 and L4 nerve roots respectively. 3. Moderate to severe lower lumbar facet arthrosis, most pronounced at L4-5 on the right and L5-S1 on the left. Findings could contribute to lower back pain.  PATIENT SURVEYS:   11/06/23: FOTO update 29% 09/11/23: FOTO: 32% 08/08/23: FOTO eval:  25%   SCREENING FOR RED FLAGS: 08/08/23 Bowel or bladder incontinence: No Cauda equina syndrome: No  COGNITION: 08/08/23 Overall cognitive status: WFL normal      SENSATION: 08/08/23 WFL  POSTURE:  08/08/23 rounded shoulders and forward head, increased lumbar lordosis  PALPATION: 08/08/23 TTP: cervical, throacic and lumbar paraspinals, bil upper traps, bil QL, bil iliosacral joint  line  ROM:   LUMBAR AROM 08/08/23 08/28/2023 10/09/23 11/06/23  Flexion Finger tips to top of knees  Finger tips to top of knees Finger tips to top of knees  Extension 0, unable 25% WFL, moved to 50 % c repeated x 5 in standing.   End range pain  noted 12 degrees 14   Right lateral flexion 16  14 14   Left lateral flexion 15  10 10   Right rotation Limited 80%  Limited 50% Limited 50%  Left rotation limited 80%  Limited 75% Imited 75%   (Blank rows = not tested)  CERVICAL AROM 08/08/23 08/28/2023 09/25/23 10/09/23 11/06/23  Flexion 10 22 24 15 12  c pain today  Extension 12 25 25 16 15   Right lateral flexion 14      Left lateral flexion 11      Right rotation 25 35 40 25 14 c pain today  Left rotation       32          45         45         24          26    LOWER EXTREMITY ROM:       Right 08/08/23 Left 08/08/23 Rt 10/09/23 Left 10/09/23  Hip flexion 80 76 90 92  Hip extension      Hip abduction      Hip adduction      Hip internal rotation      Hip external rotation      Knee flexion      Knee extension       (Blank rows = not tested)  LOWER EXTREMITY MMT:    MMT Right 08/08/23 Left 08/08/23 Rt / Left 10/09/23 11/06/23 Rt / Left  Hip flexion 3- 3- 4 / 4  4 / 4+  Hip extension      Hip abduction 3 3 4  / 4  4 / 4+  Hip adduction 3 3 4  / 4 4+ / 5  Hip internal rotation      Hip external rotation      Knee flexion 4 4 5  / 5  5 / 5  Knee extension 4 4 5  / 5  5 / 5  Cervical extension strength 15.6 pounds with hand-held dynamometer   Held this visit due to increased neck pain   (Blank rows = not tested)  LUMBAR SPECIAL TESTS:  08/08/23 Slump test: Negative bilateral pt reporting more hamstring tightness bilaterally  FUNCTIONAL TESTS:  08/08/23 5 times sit to stand: 90 seconds c UE support 10/09/23:  5 time sit to stand: 37.8 seconds UE support 10/23/23: 5 time sit to stand: 24.8 seconds UE support  GAIT: 08/08/23 Distance walked: clinic distances level  surface Assistive device utilized: None Level of assistance: Complete Independence Comments: wide BOS, antalgic gait  TODAY'S TREATMENT:                                                                         DATE: 11/23/23 Pt seen for aquatic therapy today.  Treatment took place in water 3.5-4.75 ft in depth at the [facility] pool. Temp of water was 91.  Pt entered/exited the pool via stairs with hand rail.   Pt requires the buoyancy and hydrostatic pressure of water for support, and to offload joints by unweighting joint load by at least 50 % in navel deep water and by at least 75-80% in chest to neck deep water.  Viscosity of the water is needed for resistance of strengthening. Water current perturbations provides challenge to standing balance requiring increased core activation. Aquatic PT exercises Forward walking and backward walking 3 round trips, no UE support Sidestepping 4 round trips with shoulder circles  Tandem walk 2 round trips without UE support Lumbar flexion L stretch 5 sec X 10 holding at pool wall Leg swings hip abd/add 2X15 bilat with UE support with UE support Leg swings hip flexion/extension X 20 bilat Marches X 15 bilat Shoulder horizontal adduction/abduction X 15 bilat with water dumbells Shoulder push/pull X 15 bilat with water dumbells and contralateral step back Stir the pot with single water dumbell X 20 Clockwise, X 20 CCW. Monster walks 3 round trips Seated on noodle bicycle X 5 min for spinal decompression  DATE: 11/09/23 Pt seen for aquatic therapy today.  Treatment took place in water 3.5-4.75 ft in depth at the [facility] pool. Temp of water was 91.  Pt entered/exited the pool via stairs with hand rail.   Pt requires the  buoyancy and hydrostatic pressure of water for support, and to offload joints by unweighting joint load by at least 50 % in navel deep water and by at least 75-80% in chest to neck deep water.  Viscosity of the water is needed for resistance of strengthening. Water current perturbations provides challenge to standing balance requiring increased core activation. Aquatic PT exercises Forward walking and backward walking 3 round trips, no UE support Sidestepping 4 round trips with shoulder circles  Tandem walk 2 round trips with UE support of noodle, then 1 trip without noodle Lumbar flexion L stretch 5 sec X 10 holding at pool wall Leg swings hip abd/add 2X15 bilat with UE support with UE support Leg swings hip flexion/extension X 20 bilat Marches X 15 bilat Shoulder horizontal adduction/abduction X 15 bilat with water dumbells Shoulder push/pull X 15 bilat with water dumbells Stir the pot with single water dumbell X 15 Clockwise, X 15 CCW. Monster walks 3 round trips Seated on noodle bicycle X 5 min for spinal decompression     DATE: 10/23/23 TherEx:  Nustep Level 5 x 8 minutes Standing hamstring curls x 15 bil LE's c UE support Standing hip extension attempted, pt still reporting increased low back pain Standing calf raises x 20 c UE support Standing on Airex beam tandem stance x 3 each LE holding 30 sec with intermittent finger tap support  Leg Press: 75# bil LE's 2 x 15      PATIENT EDUCATION:  Education details: HEP, POC Person educated: Patient Education method: Programmer, multimedia, Demonstration,  Verbal cues, and Handouts Education comprehension: verbalized understanding, returned demonstration, and verbal cues required  HOME EXERCISE PROGRAM: Access Code: [email or web address] URL: [email or web address] Date: 08/08/2023 Prepared by: Narda Amber  Exercises - Seated Hamstring Stretch  - 2 x daily - 7 x weekly - 3 reps - 20-30 seconds hold - Sit to Stand with Counter  Support  - 2 x daily - 7 x weekly - 2-3 sets - 5 reps - Standing Lumbar Extension at Wall - Forearms  - 1 x daily - 7 x weekly - 3 sets - 10 reps - Gentle Levator Scapulae Stretch  - 2 x daily - 7 x weekly - 3 reps - 10 seconds hold  ASSESSMENT:  CLINICAL IMPRESSION: She was very motivated today in the pool for aquatic PT, she even showed up early was working on some of her exercises independently. She has one more visit left scheduled currently.   OBJECTIVE IMPAIRMENTS: decreased mobility, difficulty walking, decreased ROM, decreased strength, and pain.   ACTIVITY LIMITATIONS: lifting, bending, sitting, standing, squatting, and sleeping  PARTICIPATION LIMITATIONS: cleaning, laundry, driving, and community activity, occupation, sleeping, transfers, cooking, and ADL's  PERSONAL FACTORS: 3+ comorbidities: see pertinent history  are also affecting patient's functional outcome.   REHAB POTENTIAL: Fair due to multiple areas to address  CLINICAL DECISION MAKING: Evolving/moderate complexity  EVALUATION COMPLEXITY: Moderate   GOALS: Goals reviewed with patient? Yes  SHORT TERM GOALS: (target date for Short term goals are 3 weeks 08/31/23)  1. Patient will demonstrate independent use of home exercise program to maintain progress from in clinic treatments.  Goal status: met 08/28/2023  LONG TERM GOALS: (target dates for all long term goals are 10 weeks  12/14/23 )   1. Patient will demonstrate/report pain at worst less than or equal to 2/10 to facilitate minimal limitation in daily activity secondary to pain symptoms.  Goal status: on going 10/23/23   2. Patient will demonstrate independent use of home exercise program to facilitate ability to maintain/progress functional gains from skilled physical therapy services.  Goal status:  on going 10/23/23   3. Patient will demonstrate FOTO outcome > or = 46 % to indicate reduced disability due to condition.  Goal status:  on going  10/23/23   4. Patient will improve her 5 time sit to stand to </= 30 seconds with/without UE support.   Goal status:  MET 10/23/23   5.  Pt  will be able to navigate 5 steps with single hand rail with step over step gait pattern.  Goal status:  on going 10/23/23   6.  Pt will improve her bilateral cervical rotation to >/= 50 degrees to improved functional mobility.  Goal status:  on going 10/23/23  7. Pt will be able to tolerate sitting for up to 20 minutes with pain </= 3/10 for improved driving tolerance.  Goal Status:  on going 10/23/23     PLAN:  PT FREQUENCY: 1-2x/week  PT DURATION: 8 weeks  PLANNED INTERVENTIONS: Therapeutic exercises, Therapeutic activity, Neuro Muscular re-education, Balance training, Gait training, Patient/Family education, Joint mobilization, Stair training, DME instructions, Dry Needling, Electrical stimulation, Cryotherapy, vasopneumatic device, Moist heat, Taping, Traction Ultrasound, Ionotophoresis 4mg /ml Dexamethasone, and aquatic therapy, Manual therapy.  All included unless contraindicated  PLAN FOR NEXT SESSION:   One more visit scheduled to assess if she is ready to finish up  Ivery Quale, PT, DPT 11/23/23 9:27 AM

## 2023-11-26 IMAGING — MR MR LUMBAR SPINE W/O CM
4 of 5 series · 24 of 48 positions shown · non-contrast
Comparison: Prior radiograph from 08/04/2021

CLINICAL DATA: Initial evaluation for low back pain.

EXAM:
MRI LUMBAR SPINE WITHOUT CONTRAST
TECHNIQUE: Multiplanar, multisequence MR imaging of the lumbar spine was
performed. No intravenous contrast was administered.

[Series 3: T2 · sagittal · 4.0mm · 0.53mm/px · 6 of 13 slices shown (1 of 2)]
[im 1/13]
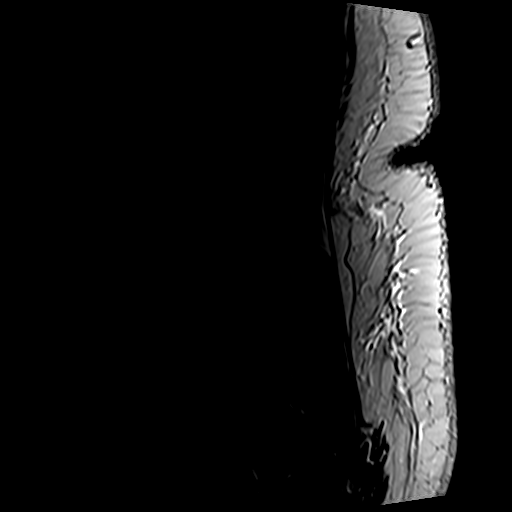
[im 3/13]
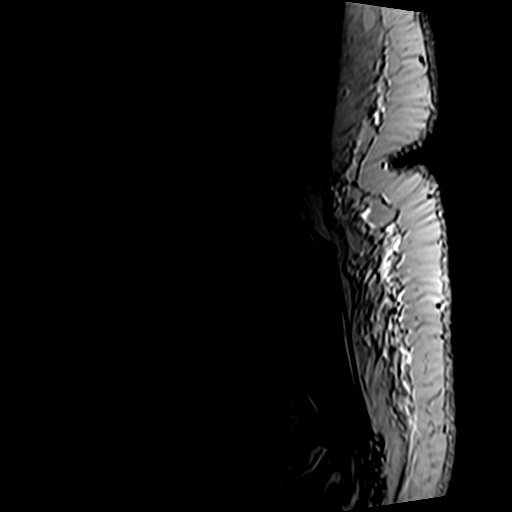
[im 5/13]
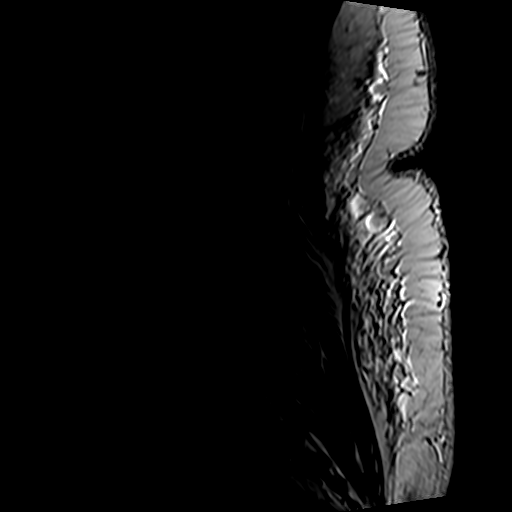
[im 8/13]
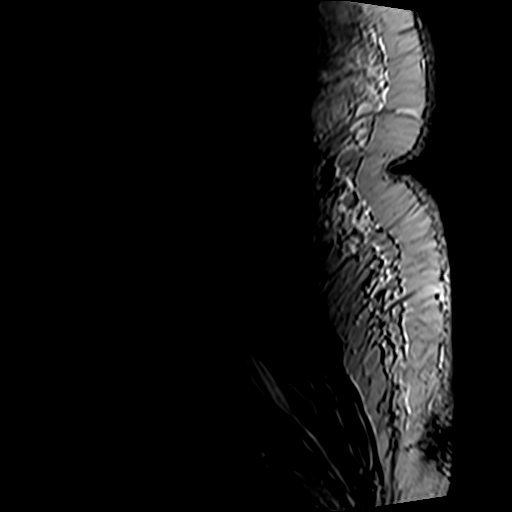
[im 10/13]
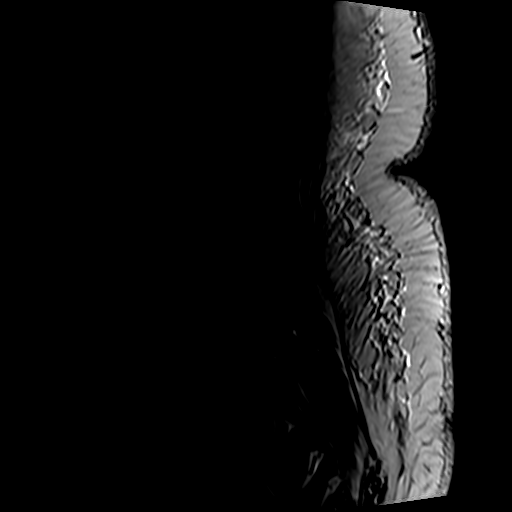
[im 13/13]
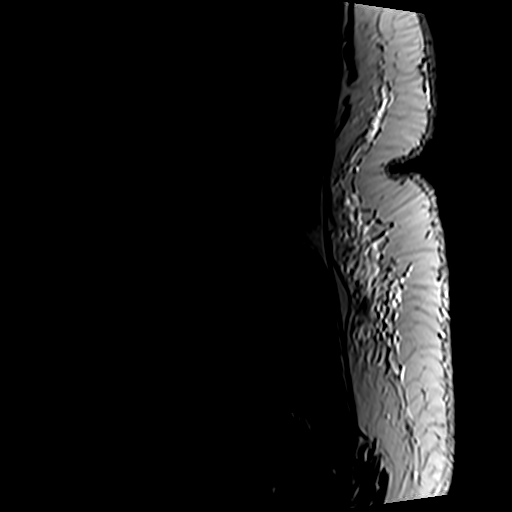

[Series 5: T1 · sagittal · 4.0mm · 0.53mm/px · 6 of 13 slices shown (1 of 2)]
[im 1/13]
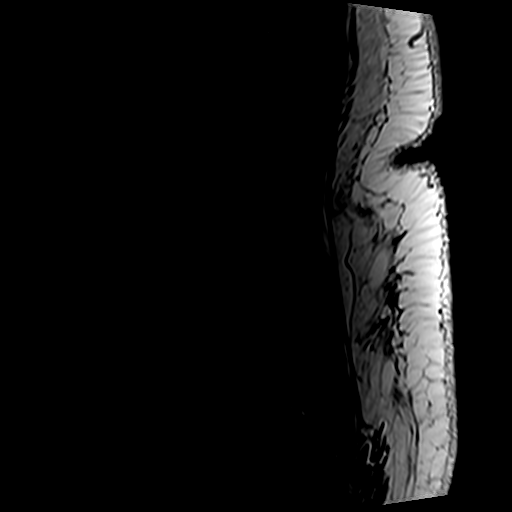
[im 3/13]
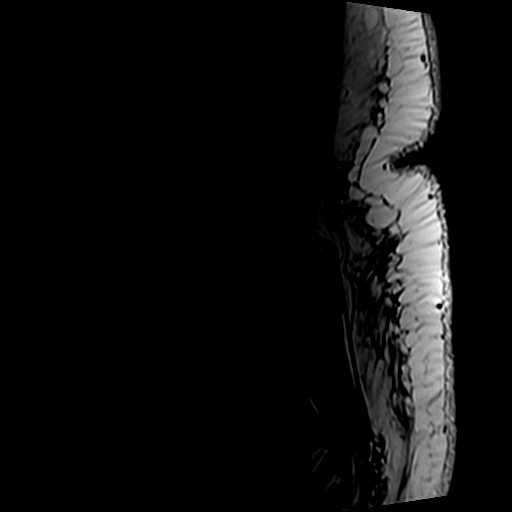
[im 5/13]
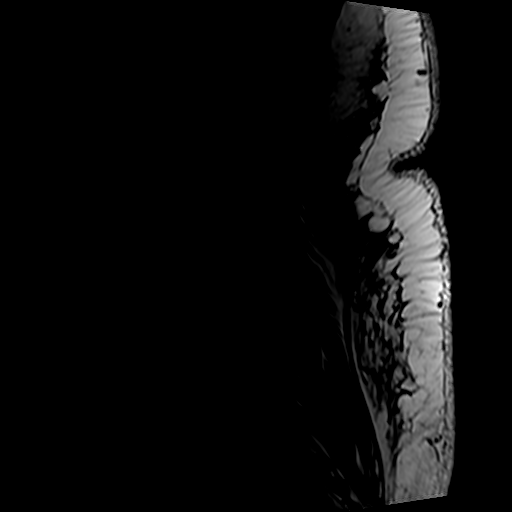
[im 8/13]
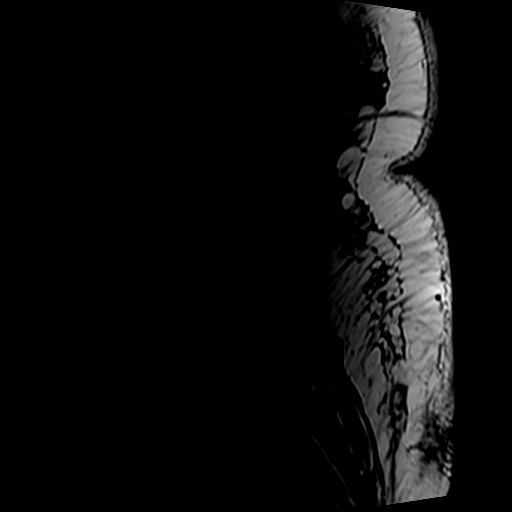
[im 10/13]
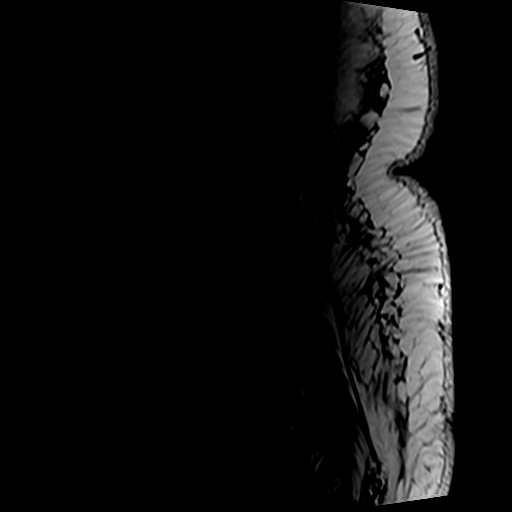
[im 13/13]
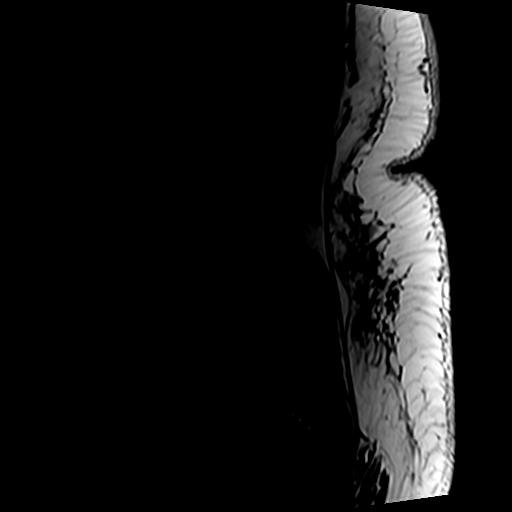

[Series 6: T2 · axial · 4.0mm · 0.70mm/px · z∈[-36,+166]mm · 9 of 34 slices shown (2 of 2)]
[im 1/34]
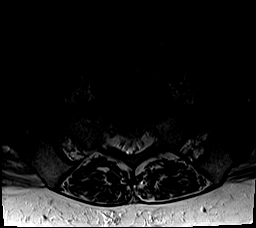
[im 5/34]
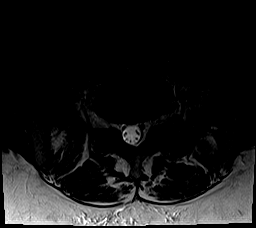
[im 10/34]
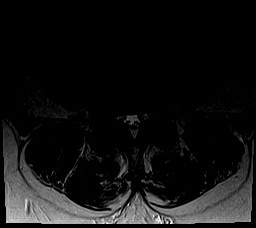
[im 15/34]
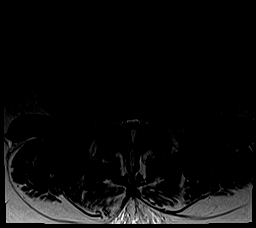
[im 17/34]
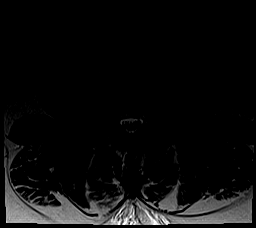
[im 19/34]
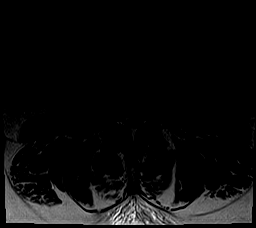
[im 24/34]
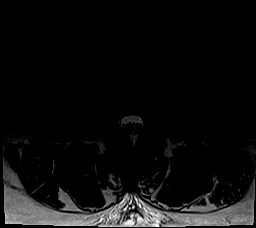
[im 29/34]
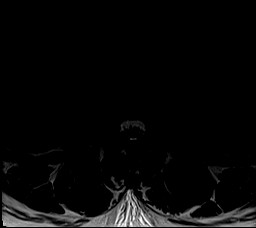
[im 34/34]
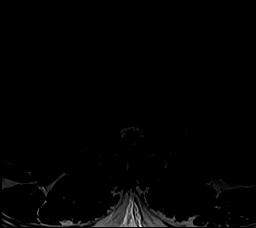

[Series 7: T1 · axial · 4.0mm · 0.35mm/px · z∈[-16,+140]mm · 3 of 34 slices shown (2 of 2)]
[im 5/34]
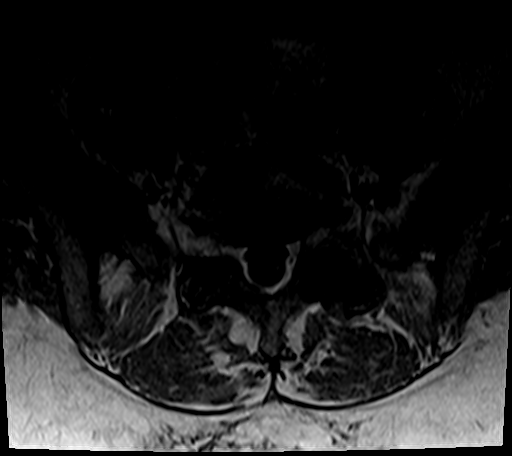
[im 17/34]
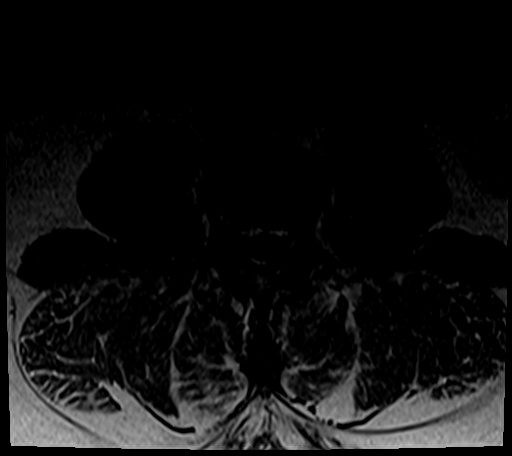
[im 29/34]
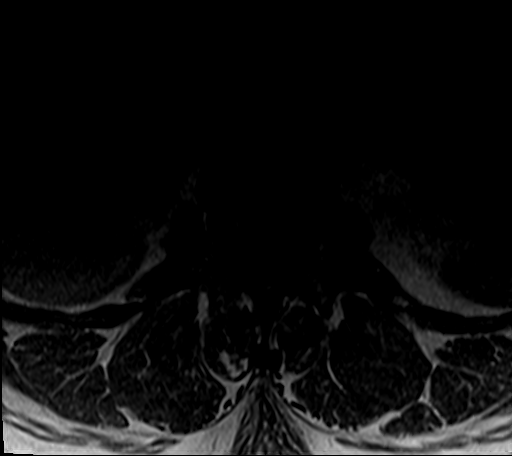

[24 of 48 positions shown; findings below may reference images not displayed]

FINDINGS: Segmentation: Standard. Lowest well-formed disc space labeled the
L5-S1 level.

Alignment: Trace degenerative anterolisthesis of L3 on L4 and L4 on
L5. Alignment otherwise normal preservation of the normal lumbar
lordosis.

Vertebrae: Vertebral body height maintained without acute or chronic
fracture. Bone marrow signal intensity diffusely decreased on T1
weighted imaging, nonspecific, but most commonly related to anemia,
smoking, or obesity. Mild reactive endplate change present about the
L2-3 interspace. Reactive marrow edema present about the right
greater than left L4-5 facets due to facet arthritis. No other
abnormal marrow edema.

Conus medullaris and cauda equina: Conus extends to the L1-2 level.
Conus and cauda equina appear normal.

Paraspinal and other soft tissues: Paraspinous soft tissues
demonstrate no acute finding. Visualized visceral structures
unremarkable.

Disc levels:

T11-12: Seen only on sagittal projection. Mild disc bulge with disc
desiccation. Bilateral facet hypertrophy. No significant spinal
stenosis. Moderate bilateral foraminal narrowing.

T12-L1: Normal interspace. Mild bilateral facet hypertrophy. No
stenosis.

L1-2: Negative interspace. Mild bilateral facet hypertrophy. No
stenosis.

L2-3: Degenerative intervertebral disc space narrowing with diffuse
disc bulge and disc desiccation. Superimposed biforaminal disc
protrusions, right slightly larger than left (series 6, image 15).
Superimposed small left subarticular component noted on the left
(series 6, image 16). Moderate bilateral facet hypertrophy. Mild
prominence of the dorsal epidural fat. Resultant moderate spinal
stenosis. Moderate bilateral L2 foraminal narrowing.

L3-4: Trace listhesis. Diffuse disc bulge with disc desiccation.
Superimposed right foraminal disc protrusion contacts the exiting
right L3 nerve root (series 6, image 19). Moderate bilateral facet
hypertrophy. Resultant mild spinal stenosis. Mild left with moderate
right L3 foraminal narrowing.

L4-5: Right foraminal to extraforaminal disc protrusion contacts the
exiting right L4 nerve root as it courses of the right neural
foramen (series 6, image 24). Severe right worse than left facet
arthrosis. No significant spinal stenosis. Mild left with moderate
right L4 foraminal narrowing.

L5-S1: Negative interspace. Severe left with moderate right facet
arthrosis. No significant spinal stenosis. Foramina remain patent.
IMPRESSION: 1. Multifactorial degenerative changes at L2-3 with resultant
moderate canal and bilateral L2 foraminal stenosis.
2. Right foraminal to extraforaminal disc protrusions at L3-4 and
L4-5, contacting and potentially affecting the exiting right L3 and
L4 nerve roots respectively.
3. Moderate to severe lower lumbar facet arthrosis, most pronounced
at L4-5 on the right and L5-S1 on the left. Findings could
contribute to lower back pain.

## 2023-11-30 ENCOUNTER — Encounter: Payer: Self-pay | Admitting: Physical Therapy

## 2023-11-30 ENCOUNTER — Ambulatory Visit (INDEPENDENT_AMBULATORY_CARE_PROVIDER_SITE_OTHER): Payer: BC Managed Care – PPO | Admitting: Physical Therapy

## 2023-11-30 DIAGNOSIS — M5459 Other low back pain: Secondary | ICD-10-CM

## 2023-11-30 DIAGNOSIS — M542 Cervicalgia: Secondary | ICD-10-CM | POA: Diagnosis not present

## 2023-11-30 DIAGNOSIS — R262 Difficulty in walking, not elsewhere classified: Secondary | ICD-10-CM | POA: Diagnosis not present

## 2023-11-30 DIAGNOSIS — M546 Pain in thoracic spine: Secondary | ICD-10-CM

## 2023-11-30 DIAGNOSIS — M6281 Muscle weakness (generalized): Secondary | ICD-10-CM

## 2023-11-30 NOTE — Therapy (Signed)
OUTPATIENT PHYSICAL THERAPY  TREATMENT/Discharge PHYSICAL THERAPY DISCHARGE SUMMARY  Visits from Start of Care: 19  Current functional level related to goals / functional outcomes: See below   Remaining deficits: See below   Education / Equipment: HEP  Plan:  Patient goals were some met. Patient is being discharged at her request       Patient Name: Morgan Molina MRN: [id number] DOB:1976-02-04, 48 y.o., female Today's Date: 11/30/2023   END OF SESSION:  PT End of Session - 11/30/23 0836     Visit Number 19    Number of Visits 20    Date for PT Re-Evaluation 12/14/23    Progress Note Due on Visit 20    PT Start Time 0805    PT Stop Time 0845    PT Time Calculation (min) 40 min    Activity Tolerance Patient tolerated treatment well    Behavior During Therapy [facility] for tasks assessed/performed                Past Medical History:  Diagnosis Date   Anemia    Anxiety    Arthritis    Depression    Dyspnea    Headache    Hypertension    Muscle spasm    PTSD (post-traumatic stress disorder) 09/28/2019   Syncope    Past Surgical History:  Procedure Laterality Date   ANTERIOR CERVICAL DECOMP/DISCECTOMY FUSION N/A 10/04/2022   Procedure: CERVICAL THREE-FOUR, CERVICAL FOUR-FIVE, CERVICAL FIVE-SIX, CERVICAL SIX-SEVEN, ANTERIOR CERVICAL DISCECTOMY FUSION, ALLOGRAFT, PLATE;  Surgeon: Eldred Manges, MD;  Location: [facility] OR;  Service: Orthopedics;  Laterality: N/A;   BREAST EXCISIONAL BIOPSY Right    CARPAL TUNNEL RELEASE Left 02/20/2020   Procedure: LEFT CARPAL TUNNEL RELEASE;  Surgeon: Betha Loa, MD;  Location: [facility];  Service: Orthopedics;  Laterality: Left;   CARPAL TUNNEL RELEASE Right 02/28/2021   Procedure: CARPAL TUNNEL RELEASE;  Surgeon: Betha Loa, MD;  Location: [facility];  Service: Orthopedics;  Laterality: Right;   CHOLECYSTECTOMY     GANGLION CYST EXCISION Left 02/20/2020   Procedure: LEFT DORSAL GANGLION  EXCISION;  Surgeon: Betha Loa, MD;  Location: [facility];  Service: Orthopedics;  Laterality: Left;   TRIGGER FINGER RELEASE Bilateral 08/29/2021   Procedure: RELEASE TRIGGER FINGER/A-1 PULLEY BILATERAL THUMBS;  Surgeon: Betha Loa, MD;  Location: [facility];  Service: Orthopedics;  Laterality: Bilateral;  30 MIN   TUBAL LIGATION     Patient Active Problem List   Diagnosis Date Noted   Status post cervical spinal fusion 07/13/2023   Spinal stenosis of lumbar region 07/13/2023   Cervical spinal stenosis 10/04/2022   Chest pain 03/17/2022   Unstable angina (HCC) 03/15/2022   Paresthesias 06/23/2021   Neck pain 06/23/2021   Alteration consciousness 06/23/2021   Anxiety 06/23/2021   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   Arthralgia 03/30/2021   Hyperglycemia 03/30/2021   Shortness of breath 03/30/2021   Bilateral chronic serous otitis media 08/15/2020   Eustachian tube dysfunction, bilateral 08/15/2020   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   Ganglion cyst of dorsum of left wrist 10/01/2019   Decreased visual acuity 10/01/2019   PTSD (post-traumatic stress disorder) 09/28/2019   Nipple discharge 04/02/2019   Panic attacks 02/19/2019   Bilateral low back pain 02/19/2019   Seasonal allergies 02/19/2019   Anxiety and depression 01/14/2019   Syncope 01/14/2019   Breast mass, right 01/14/2019   Hypertension     PCP: Grayce Sessions,  NP   REFERRING PROVIDER: Grayce Sessions, NP   REFERRING DIAG:  Diagnosis  M54.42,M54.41,G89.29 (ICD-10-CM) - Chronic bilateral low back pain with bilateral sciatica    Rationale for Evaluation and Treatment: Rehabilitation  THERAPY DIAG:  Other low back pain  Pain in thoracic spine  Cervicalgia  Difficulty in walking, not elsewhere classified  Muscle weakness (generalized)  ONSET DATE: over a year  SUBJECTIVE:                                                                                                                                                                                            SUBJECTIVE STATEMENT: She continues to have 6-7 out of 10 pain constantly, she feels ready to do discharge  PERTINENT HISTORY:  4 level cervical fusion November 2023, recent spinal epidural, anxiety, PTSD, carpal tunnel, unstable angina, depression, arthritis, dyspnea, HA, muscle spasm  PAIN:  NPRS scale:  6/10 upon arrival Pain location: low back and neck Pain description: achy, throbbing Aggravating factors: "basic activities" prolonged standing, house activity  Relieving factors: nothing seems to help  PRECAUTIONS: None  WEIGHT BEARING RESTRICTIONS: No  FALLS:  Has patient fallen in last 6 months? No  LIVING ENVIRONMENT: Lives with: lives with their family and lives with their spouse Lives in: House/apartment Stairs: Yes: External: 5 steps; on left going up Has following equipment at home: None  OCCUPATION: not currently working  PLOF: Independent  PATIENT GOALS: Be able to move better, be able to cook a full meal. Be able to perform ADL's and household chores. Be able to drive more than 10 minutes at a time.     OBJECTIVE:   DIAGNOSTIC FINDINGS:  MRI scan showed moderate stenosis L2-3 mild to moderate at 3 4 facet arthropathy bilateral at L2 bilateral L5-S1 and worse on the right at L4-5.  IMPRESSION: 1. Multifactorial degenerative changes at L2-3 with resultant moderate canal and bilateral L2 foraminal stenosis. 2. Right foraminal to extraforaminal disc protrusions at L3-4 and L4-5, contacting and potentially affecting the exiting right L3 and L4 nerve roots respectively. 3. Moderate to severe lower lumbar facet arthrosis, most pronounced at L4-5 on the right and L5-S1 on the left. Findings could contribute to lower back pain.  PATIENT SURVEYS:   11/06/23: FOTO update 29% 09/11/23: FOTO: 32% 08/08/23: FOTO eval:  25%   SCREENING FOR RED  FLAGS: 08/08/23 Bowel or bladder incontinence: No Cauda equina syndrome: No  COGNITION: 08/08/23 Overall cognitive status: WFL normal      SENSATION: 08/08/23 WFL  POSTURE:  08/08/23 rounded shoulders and forward head, increased lumbar lordosis  PALPATION: 08/08/23 TTP: cervical, throacic and  lumbar paraspinals, bil upper traps, bil QL, bil iliosacral joint line  ROM:   LUMBAR AROM 08/08/23 08/28/2023 10/09/23 11/06/23  Flexion Finger tips to top of knees  Finger tips to top of knees Finger tips to top of knees  Extension 0, unable 25% WFL, moved to 50 % c repeated x 5 in standing.   End range pain noted 12 degrees 14   Right lateral flexion 16  14 14   Left lateral flexion 15  10 10   Right rotation Limited 80%  Limited 50% Limited 50%  Left rotation limited 80%  Limited 75% Imited 75%   (Blank rows = not tested)  CERVICAL AROM 08/08/23 08/28/2023 09/25/23 10/09/23 11/06/23  Flexion 10 22 24 15 12  c pain today  Extension 12 25 25 16 15   Right lateral flexion 14      Left lateral flexion 11      Right rotation 25 35 40 25 14 c pain today  Left rotation       32          45         45         24          26    LOWER EXTREMITY ROM:       Right 08/08/23 Left 08/08/23 Rt 10/09/23 Left 10/09/23  Hip flexion 80 76 90 92  Hip extension      Hip abduction      Hip adduction      Hip internal rotation      Hip external rotation      Knee flexion      Knee extension       (Blank rows = not tested)  LOWER EXTREMITY MMT:    MMT Right 08/08/23 Left 08/08/23 Rt / Left 10/09/23 11/06/23 Rt / Left  Hip flexion 3- 3- 4 / 4  4 / 4+  Hip extension      Hip abduction 3 3 4  / 4  4 / 4+  Hip adduction 3 3 4  / 4 4+ / 5  Hip internal rotation      Hip external rotation      Knee flexion 4 4 5  / 5  5 / 5  Knee extension 4 4 5  / 5  5 / 5  Cervical extension strength 15.6 pounds with hand-held dynamometer   Held this visit due to increased neck pain   (Blank rows = not tested)  LUMBAR  SPECIAL TESTS:  08/08/23 Slump test: Negative bilateral pt reporting more hamstring tightness bilaterally  FUNCTIONAL TESTS:  08/08/23 5 times sit to stand: 90 seconds c UE support 10/09/23:  5 time sit to stand: 37.8 seconds UE support 10/23/23: 5 time sit to stand: 24.8 seconds UE support  GAIT: 08/08/23 Distance walked: clinic distances level surface Assistive device utilized: None Level of assistance: Complete Independence Comments: wide BOS, antalgic gait  TODAY'S TREATMENT:                                                                         DATE: 11/30/23 Pt seen for aquatic therapy today.  Treatment took place in water 3.5-4.75 ft in depth at the [facility] pool. Temp of water was 91.  Pt entered/exited the pool via stairs with hand rail.   Pt requires the buoyancy and hydrostatic pressure of water for support, and to offload joints by unweighting joint load by at least 50 % in navel deep water and by at least 75-80% in chest to neck deep water.  Viscosity of the water is needed for resistance of strengthening. Water current perturbations provides challenge to standing balance requiring increased core activation. Aquatic PT exercises Forward walking and backward walking 3 round trips, no UE support Sidestepping 4 round trips with shoulder circles  Tandem walk 2 round trips without UE support Lumbar flexion L stretch 5 sec X 10 holding at pool wall Leg swings hip abd/add 2X15 bilat with UE support with UE support Leg swings hip flexion/extension X 20 bilat Shoulder horizontal adduction/abduction X 15 bilat with water dumbells Shoulder push/pull X 15 bilat with water dumbells and contralateral step back Stir the pot with single water dumbell X 20 Clockwise, X  20 CCW. Monster walks 3 round trips Push/pull kickboard  X20 reps Shoulder extension with kickbboard X 20 reps Seated on noodle bicycle X 5 min for spinal decompression     PATIENT EDUCATION:  Education details: HEP, POC Person educated: Patient Education method: Explanation, Demonstration, Verbal cues, and Handouts Education comprehension: verbalized understanding, returned demonstration, and verbal cues required  HOME EXERCISE PROGRAM: Access Code: [email or web address] URL: [email or web address] Date: 08/08/2023 Prepared by: Narda Amber  Exercises - Seated Hamstring Stretch  - 2 x daily - 7 x weekly - 3 reps - 20-30 seconds hold - Sit to Stand with Counter Support  - 2 x daily - 7 x weekly - 2-3 sets - 5 reps - Standing Lumbar Extension at Wall - Forearms  - 1 x daily - 7 x weekly - 3 sets - 10 reps - Gentle Levator Scapulae Stretch  - 2 x daily - 7 x weekly - 3 reps - 10 seconds hold  ASSESSMENT:  CLINICAL IMPRESSION: She has met some PT goals. Met her functional FOTO score goal. She feels ready to discharge to independent program. I provided her with list of community pools she can join.   OBJECTIVE IMPAIRMENTS: decreased mobility, difficulty walking, decreased ROM, decreased strength, and pain.   ACTIVITY LIMITATIONS: lifting, bending, sitting, standing, squatting, and sleeping  PARTICIPATION LIMITATIONS: cleaning, laundry, driving, and community activity, occupation, sleeping, transfers, cooking, and ADL's  PERSONAL FACTORS: 3+ comorbidities: see pertinent history  are also affecting patient's functional outcome.   REHAB POTENTIAL: Fair due to multiple areas to address  CLINICAL DECISION MAKING: Evolving/moderate complexity  EVALUATION COMPLEXITY: Moderate   GOALS: Goals reviewed with patient? Yes  SHORT TERM GOALS: (target date for Short term goals are 3 weeks 08/31/23)  1. Patient will demonstrate independent use of home exercise program to maintain  progress from in clinic treatments.  Goal status: met 08/28/2023  LONG TERM GOALS: (target dates for all  long term goals are 10 weeks  12/14/23 )   1. Patient will demonstrate/report pain at worst less than or equal to 2/10 to facilitate minimal limitation in daily activity secondary to pain symptoms.  Goal status: not MET 11/30/23   2. Patient will demonstrate independent use of home exercise program to facilitate ability to maintain/progress functional gains from skilled physical therapy services.  Goal status:  MET 11/30/23   3. Patient will demonstrate FOTO outcome > or = 46 % to indicate reduced disability due to condition.  Goal status:  MET 11/30/23   4. Patient will improve her 5 time sit to stand to </= 30 seconds with/without UE support.   Goal status:  MET 10/23/23   5.  Pt  will be able to navigate 5 steps with single hand rail with step over step gait pattern.  Goal status:  MET 12/01/23   6.  Pt will improve her bilateral cervical rotation to >/= 50 degrees to improved functional mobility.  Goal status:  not met around 40 deg now  7. Pt will be able to tolerate sitting for up to 20 minutes with pain </= 3/10 for improved driving tolerance.  Goal Status:  not met, continues to have 7/10 pain     PLAN:  PT FREQUENCY: 1-2x/week  PT DURATION: 8 weeks  PLANNED INTERVENTIONS: Therapeutic exercises, Therapeutic activity, Neuro Muscular re-education, Balance training, Gait training, Patient/Family education, Joint mobilization, Stair training, DME instructions, Dry Needling, Electrical stimulation, Cryotherapy, vasopneumatic device, Moist heat, Taping, Traction Ultrasound, Ionotophoresis 4mg /ml Dexamethasone, and aquatic therapy, Manual therapy.  All included unless contraindicated  PLAN FOR NEXT SESSION:   DC today  Ivery Quale, PT, DPT 11/30/23 8:37 AM

## 2023-12-13 ENCOUNTER — Telehealth (INDEPENDENT_AMBULATORY_CARE_PROVIDER_SITE_OTHER): Payer: Self-pay | Admitting: Primary Care

## 2023-12-13 NOTE — Telephone Encounter (Signed)
 Called pt to confirm atp. VM left with pt

## 2023-12-14 ENCOUNTER — Encounter (INDEPENDENT_AMBULATORY_CARE_PROVIDER_SITE_OTHER): Payer: Self-pay | Admitting: Primary Care

## 2023-12-14 ENCOUNTER — Ambulatory Visit (INDEPENDENT_AMBULATORY_CARE_PROVIDER_SITE_OTHER): Payer: BC Managed Care – PPO | Admitting: Primary Care

## 2023-12-14 VITALS — BP 122/80 | HR 74 | Resp 16 | Ht 64.0 in | Wt 269.6 lb

## 2023-12-14 DIAGNOSIS — Z7689 Persons encountering health services in other specified circumstances: Secondary | ICD-10-CM

## 2023-12-14 DIAGNOSIS — Z2821 Immunization not carried out because of patient refusal: Secondary | ICD-10-CM

## 2023-12-14 DIAGNOSIS — Z760 Encounter for issue of repeat prescription: Secondary | ICD-10-CM

## 2023-12-14 DIAGNOSIS — G8929 Other chronic pain: Secondary | ICD-10-CM

## 2023-12-14 DIAGNOSIS — I10 Essential (primary) hypertension: Secondary | ICD-10-CM | POA: Diagnosis not present

## 2023-12-14 DIAGNOSIS — M5441 Lumbago with sciatica, right side: Secondary | ICD-10-CM

## 2023-12-14 DIAGNOSIS — F4310 Post-traumatic stress disorder, unspecified: Secondary | ICD-10-CM | POA: Diagnosis not present

## 2023-12-14 DIAGNOSIS — M5442 Lumbago with sciatica, left side: Secondary | ICD-10-CM

## 2023-12-14 MED ORDER — IBUPROFEN 800 MG PO TABS: 800.0000 mg | ORAL_TABLET | Freq: Three times a day (TID) | ORAL | 1 refills | Status: DC | PRN

## 2023-12-14 MED ORDER — IRBESARTAN-HYDROCHLOROTHIAZIDE 150-12.5 MG PO TABS: 1.0000 | ORAL_TABLET | Freq: Every day | ORAL | 1 refills | Status: DC

## 2023-12-14 MED ORDER — WEGOVY 0.5 MG/0.5ML ~~LOC~~ SOAJ: 0.5000 mg | SUBCUTANEOUS | 1 refills | Status: DC

## 2023-12-14 MED ORDER — CLONAZEPAM 1 MG PO TABS: 1.0000 mg | ORAL_TABLET | Freq: Two times a day (BID) | ORAL | 1 refills | Status: AC | PRN

## 2023-12-14 NOTE — Progress Notes (Signed)
 [facility]  Crescent Gotham, is a 48 y.o. female  RDW:[id number]  FMW:[id number]  DOB - 1976/07/05  Chief Complaint  Patient presents with   Hypertension       Subjective:   Morgan Molina is a 48 y.o. female here today for a follow up visit. Patient has No headache, No chest pain, No abdominal pain - No Nausea, No new weakness tingling or numbness, No Cough - shortness of breath HPI  No problems updated.  Comprehensive ROS Pertinent positive and negative noted in HPI   Allergies  Allergen Reactions   Latex Dermatitis   Lisinopril Cough   Tramadol  Nausea And Vomiting    ALL   Adhesive [Tape] Rash    Past Medical History:  Diagnosis Date   Anemia    Anxiety    Arthritis    Depression    Dyspnea    Headache    Hypertension    Muscle spasm    PTSD (post-traumatic stress disorder) 09/28/2019   Syncope     Current Outpatient Medications on File Prior to Visit  Medication Sig Dispense Refill   alclomethasone (ACLOVATE ) 0.05 % cream Apply topically 2 (two) times daily. 30 g 0   ketoconazole  2%-triamcinolone  0.1% 1:2 cream mixture Apply topically 2 (two) times daily as needed. 45 g 3   No current facility-administered medications on file prior to visit.   Health Maintenance  Topic Date Due   Pap with HPV screening  Never done   COVID-19 Vaccine (1 - 2024-25 season) Never done   Flu Shot  02/04/2024*   DTaP/Tdap/Td vaccine (6 - Tdap) 09/13/2024*   Pneumococcal Vaccination (1 of 2 - PCV) 12/13/2024*   Colon Cancer Screening  04/25/2033   Hepatitis C Screening  Completed   HIV Screening  Completed   HPV Vaccine  Aged Out  *Topic was postponed. The date shown is not the original due date.    Objective:   Vitals:   12/14/23 0940  BP: 122/80  Pulse: 74  Resp: 16  SpO2: 100%  Weight: 269 lb 9.6 oz (122.3 kg)  Height: 5' 4 (1.626 m)   BP Readings from Last 3 Encounters:  12/14/23 122/80  11/13/23 (!) 143/84  09/14/23 115/80       Physical Exam Vitals reviewed.  Constitutional:      Appearance: She is obese.  HENT:     Head: Normocephalic.     Right Ear: External ear normal.     Left Ear: External ear normal.  Cardiovascular:     Rate and Rhythm: Normal rate and regular rhythm.  Pulmonary:     Effort: Pulmonary effort is normal.     Breath sounds: Normal breath sounds.  Musculoskeletal:        General: Normal range of motion.     Cervical back: Normal range of motion and neck supple.  Skin:    General: Skin is warm and dry.  Neurological:     Mental Status: She is oriented to person, place, and time.  Psychiatric:        Mood and Affect: Mood normal.        Behavior: Behavior normal.       Assessment & Plan  Meloni was seen today for hypertension.  Diagnoses and all orders for this visit:  Pneumococcal vaccination declined  Medication refill -     ibuprofen  (ADVIL ) 800 MG tablet; Take 1 tablet (800 mg total) by mouth every 8 (eight) hours as needed for moderate pain (pain  score 4-6). for pain -     irbesartan -hydrochlorothiazide  (AVALIDE) 150-12.5 MG tablet; Take 1 tablet by mouth daily.  PTSD (post-traumatic stress disorder) -     clonazePAM  (KLONOPIN ) 1 MG tablet; Take 1 tablet (1 mg total) by mouth 2 (two) times daily as needed for anxiety.  Essential hypertension Well controlled! -     irbesartan -hydrochlorothiazide  (AVALIDE) 150-12.5 MG tablet; Take 1 tablet by mouth daily.  Chronic bilateral low back pain with bilateral sciatica -     ibuprofen  (ADVIL ) 800 MG tablet; Take 1 tablet (800 mg total) by mouth every 8 (eight) hours as needed for moderate pain (pain score 4-6). for pain   Other orders 2/2 Encounter for weight management -     Semaglutide -Weight Management (WEGOVY ) 0.5 MG/0.5ML SOAJ; Inject 0.5 mg into the skin once a week.      Patient have been counseled extensively about nutrition and exercise. Other issues discussed during this visit include: low cholesterol diet,  weight control and daily exercise, foot care, annual eye examinations at Ophthalmology, importance of adherence with medications and regular follow-up. We also discussed long term complications of uncontrolled diabetes and hypertension.   Return in about 2 months (around 02/11/2024) for medical conditions.  The patient was given clear instructions to go to ER or return to medical center if symptoms don't improve, worsen or new problems develop. The patient verbalized understanding. The patient was told to call to get lab results if they haven't heard anything in the next week.   This note has been created with Education officer, environmental. Any transcriptional errors are unintentional.   Rosaline SHAUNNA Bohr, NP 12/17/2023, 9:13 AM

## 2023-12-17 ENCOUNTER — Other Ambulatory Visit: Payer: Self-pay

## 2023-12-19 ENCOUNTER — Ambulatory Visit (INDEPENDENT_AMBULATORY_CARE_PROVIDER_SITE_OTHER): Payer: BC Managed Care – PPO | Admitting: Primary Care

## 2024-02-04 ENCOUNTER — Telehealth (INDEPENDENT_AMBULATORY_CARE_PROVIDER_SITE_OTHER): Payer: Self-pay | Admitting: Primary Care

## 2024-02-04 NOTE — Telephone Encounter (Signed)
 Left VM with pt about their upcoming appt.

## 2024-02-11 ENCOUNTER — Ambulatory Visit (INDEPENDENT_AMBULATORY_CARE_PROVIDER_SITE_OTHER): Payer: BC Managed Care – PPO | Admitting: Primary Care

## 2024-02-11 ENCOUNTER — Telehealth (INDEPENDENT_AMBULATORY_CARE_PROVIDER_SITE_OTHER): Payer: Self-pay | Admitting: Primary Care

## 2024-02-11 NOTE — Telephone Encounter (Signed)
 Called to reschedule appt. Pt did not answer and VM left.

## 2024-04-04 ENCOUNTER — Telehealth (INDEPENDENT_AMBULATORY_CARE_PROVIDER_SITE_OTHER): Payer: Self-pay | Admitting: Primary Care

## 2024-04-04 NOTE — Telephone Encounter (Signed)
 Called pt to confirm appt. Pt did not answer and LVM

## 2024-04-07 ENCOUNTER — Encounter (INDEPENDENT_AMBULATORY_CARE_PROVIDER_SITE_OTHER): Payer: Self-pay | Admitting: Primary Care

## 2024-04-07 ENCOUNTER — Ambulatory Visit (INDEPENDENT_AMBULATORY_CARE_PROVIDER_SITE_OTHER): Admitting: Primary Care

## 2024-04-07 VITALS — BP 126/70 | Wt 267.6 lb

## 2024-04-07 DIAGNOSIS — M2550 Pain in unspecified joint: Secondary | ICD-10-CM | POA: Diagnosis not present

## 2024-04-07 DIAGNOSIS — R7303 Prediabetes: Secondary | ICD-10-CM

## 2024-04-07 DIAGNOSIS — M5441 Lumbago with sciatica, right side: Secondary | ICD-10-CM

## 2024-04-07 DIAGNOSIS — D509 Iron deficiency anemia, unspecified: Secondary | ICD-10-CM

## 2024-04-07 DIAGNOSIS — G8929 Other chronic pain: Secondary | ICD-10-CM

## 2024-04-07 DIAGNOSIS — Z760 Encounter for issue of repeat prescription: Secondary | ICD-10-CM | POA: Diagnosis not present

## 2024-04-07 DIAGNOSIS — M5442 Lumbago with sciatica, left side: Secondary | ICD-10-CM | POA: Diagnosis not present

## 2024-04-07 DIAGNOSIS — I10 Essential (primary) hypertension: Secondary | ICD-10-CM

## 2024-04-07 MED ORDER — IBUPROFEN 800 MG PO TABS
800.0000 mg | ORAL_TABLET | Freq: Three times a day (TID) | ORAL | 1 refills | Status: DC | PRN
Start: 2024-04-07 — End: 2024-07-03

## 2024-04-07 MED ORDER — TIRZEPATIDE 2.5 MG/0.5ML ~~LOC~~ SOAJ: 2.5000 mg | SUBCUTANEOUS | 1 refills | Status: DC

## 2024-04-07 NOTE — Progress Notes (Signed)
 [facility]   Morgan Molina, is a 48 y.o. female presents for a weight loss medication.  She was previously on Wegovy  and lost 65 pounds.  When patient went for refills her insurance denied.  Patient is also prediabetic and will do labs today and start Mounjaro.  Her weight loss has improved her self-esteem and overall health.  She is also managed for hypertension which is well-controlled. BP Readings from Last 3 Encounters:  04/07/24 126/70  12/14/23 122/80  11/13/23 (!) 143/84    Wt Readings from Last 3 Encounters:  04/07/24 267 lb 9.6 oz (121.4 kg)  12/14/23 269 lb 9.6 oz (122.3 kg)  11/13/23 276 lb (125.2 kg)   Medications: Current Outpatient Medications on File Prior to Visit  Medication Sig Dispense Refill   alclomethasone (ACLOVATE ) 0.05 % cream Apply topically 2 (two) times daily. 30 g 0   clonazePAM  (KLONOPIN ) 1 MG tablet Take 1 tablet (1 mg total) by mouth 2 (two) times daily as needed for anxiety. 60 tablet 1   ibuprofen  (ADVIL ) 800 MG tablet Take 1 tablet (800 mg total) by mouth every 8 (eight) hours as needed for moderate pain (pain score 4-6). for pain 90 tablet 1   irbesartan -hydrochlorothiazide  (AVALIDE) 150-12.5 MG tablet Take 1 tablet by mouth daily. 90 tablet 1   ketoconazole  2%-triamcinolone  0.1% 1:2 cream mixture Apply topically 2 (two) times daily as needed. 45 g 3   Semaglutide -Weight Management (WEGOVY ) 0.5 MG/0.5ML SOAJ Inject 0.5 mg into the skin once a week. 2 mL 1   No current facility-administered medications on file prior to visit.    ROS:   Denies any headaches, blurred vision, fatigue, shortness of breath, chest pain, abdominal pain, abnormal vaginal discharge/itching/odor/irritation, problems with periods, bowel movements, urination, or intercourse unless otherwise stated above.  Physical exam:  Vitals:   04/07/24 1449  BP: 126/70  General: No apparent distress.  Morbid obesity Eyes: Extraocular eye movements intact,  pupils equal and round. Neck: Supple, trachea midline. Thyroid: No enlargement, mobile without fixation, no tenderness. Cardiovascular: Regular rhythm and rate, no murmur, normal radial pulses. Respiratory: Normal respiratory effort, clear to auscultation. Gastrointestinal: Normal pitch active bowel sounds, nontender abdomen without distention or appreciable hepatomegaly. Musculoskeletal: Normal muscle tone, no tenderness on palpation of tibia, no excessive thoracic kyphosis. Skin: Appropriate warmth, no visible rash. Mental status: Alert, conversant, speech clear, thought logical, appropriate mood and affect, no hallucinations or delusions evident. Hematologic/lymphatic: No cervical adenopathy, no visible ecchymoses.   Assessment and Plan: Obesity with co morbid conditions.  General weight loss/lifestyle modification strategies discussed (elicit support from others; identify saboteurs; non-food rewards, etc). Diet interventions: qualitative changes (increase low-fat,  high-fiber foods). Regular aerobic exercise program discussed. Medication: Mounjaro Follow up in: 1 month  Morgan Molina was seen today for medical management of chronic issues.  Diagnoses and all orders for this visit:  Arthralgia, unspecified joint -     ibuprofen  (ADVIL ) 800 MG tablet; Take 1 tablet (800 mg total) by mouth every 8 (eight) hours as needed for moderate pain (pain score 4-6). for pain  Medication refill -     ibuprofen  (ADVIL ) 800 MG tablet; Take 1 tablet (800 mg total) by mouth every 8 (eight) hours as needed for moderate pain (pain score 4-6). for pain  Chronic bilateral low back pain with bilateral sciatica -     ibuprofen  (ADVIL ) 800 MG tablet; Take 1 tablet (800 mg total) by mouth every 8 (eight)  hours as needed for moderate pain (pain score 4-6). for pain  Morbid obesity (HCC) -     tirzepatide (MOUNJARO) 2.5 MG/0.5ML Pen; Inject 2.5 mg into the skin once a week.  Prediabetes -     tirzepatide  (MOUNJARO) 2.5 MG/0.5ML Pen; Inject 2.5 mg into the skin once a week. -     Lipid Panel -     Hemoglobin A1c  Essential hypertension DIET: Limit salt intake, read nutrition labels to check salt content, limit fried and high fatty foods  Avoid using multisymptom OTC cold preparations that generally contain sudafed which can rise BP. Consult with pharmacist on best cold relief products to use for persons with HTN EXERCISE Discussed incorporating exercise such as walking - 30 minutes most days of the week and can do in 10 minute intervals    -     CMP14+EGFR  Iron deficiency anemia, unspecified iron deficiency anemia type -     CBC with Differential    This note has been created with Education officer, environmental. Any transcriptional errors are unintentional.   Marius Siemens, NP 04/07/2024, 3:21 PM

## 2024-04-08 LAB — CBC WITH DIFFERENTIAL/PLATELET
Basophils Absolute: 0.1 10*3/uL (ref 0.0–0.2)
Basos: 1 %
EOS (ABSOLUTE): 0.1 10*3/uL (ref 0.0–0.4)
Eos: 2 %
Hematocrit: 35.7 % (ref 34.0–46.6)
Hemoglobin: 10.8 g/dL — ABNORMAL LOW (ref 11.1–15.9)
Immature Grans (Abs): 0 10*3/uL (ref 0.0–0.1)
Immature Granulocytes: 0 %
Lymphocytes Absolute: 3.1 10*3/uL (ref 0.7–3.1)
Lymphs: 43 %
MCH: 26 pg — ABNORMAL LOW (ref 26.6–33.0)
MCHC: 30.3 g/dL — ABNORMAL LOW (ref 31.5–35.7)
MCV: 86 fL (ref 79–97)
Monocytes Absolute: 0.6 10*3/uL (ref 0.1–0.9)
Monocytes: 8 %
Neutrophils Absolute: 3.3 10*3/uL (ref 1.4–7.0)
Neutrophils: 46 %
Platelets: 382 10*3/uL (ref 150–450)
RBC: 4.15 x10E6/uL (ref 3.77–5.28)
RDW: 16.6 % — ABNORMAL HIGH (ref 11.7–15.4)
WBC: 7.2 10*3/uL (ref 3.4–10.8)

## 2024-04-08 LAB — CMP14+EGFR
ALT: 12 IU/L (ref 0–32)
AST: 13 IU/L (ref 0–40)
Albumin: 4.4 g/dL (ref 3.9–4.9)
Alkaline Phosphatase: 55 IU/L (ref 44–121)
BUN/Creatinine Ratio: 16 (ref 9–23)
BUN: 11 mg/dL (ref 6–24)
Bilirubin Total: <0.2 mg/dL (ref 0.0–1.2)
CO2: 23 mmol/L (ref 20–29)
Calcium: 9.7 mg/dL (ref 8.7–10.2)
Chloride: 103 mmol/L (ref 96–106)
Creatinine, Ser: 0.69 mg/dL (ref 0.57–1.00)
Globulin, Total: 2.5 g/dL (ref 1.5–4.5)
Glucose: 92 mg/dL (ref 70–99)
Potassium: 4.2 mmol/L (ref 3.5–5.2)
Sodium: 142 mmol/L (ref 134–144)
Total Protein: 6.9 g/dL (ref 6.0–8.5)
eGFR: 107 mL/min/{1.73_m2} (ref >59)

## 2024-04-08 LAB — LIPID PANEL
Chol/HDL Ratio: 2.4 ratio (ref 0.0–4.4)
Cholesterol, Total: 188 mg/dL (ref 100–199)
HDL: 79 mg/dL (ref >39)
LDL Chol Calc (NIH): 86 mg/dL (ref 0–99)
Triglycerides: 137 mg/dL (ref 0–149)
VLDL Cholesterol Cal: 23 mg/dL (ref 5–40)

## 2024-04-08 LAB — HEMOGLOBIN A1C
Est. average glucose Bld gHb Est-mCnc: 108 mg/dL
Hgb A1c MFr Bld: 5.4 % (ref 4.8–5.6)

## 2024-04-13 ENCOUNTER — Ambulatory Visit (INDEPENDENT_AMBULATORY_CARE_PROVIDER_SITE_OTHER): Payer: Self-pay | Admitting: Primary Care

## 2024-04-15 ENCOUNTER — Telehealth (INDEPENDENT_AMBULATORY_CARE_PROVIDER_SITE_OTHER): Payer: Self-pay

## 2024-04-15 ENCOUNTER — Telehealth (INDEPENDENT_AMBULATORY_CARE_PROVIDER_SITE_OTHER): Payer: Self-pay | Admitting: Primary Care

## 2024-04-15 NOTE — Telephone Encounter (Signed)
Please reach out to pt?

## 2024-04-15 NOTE — Telephone Encounter (Signed)
 Copied from CRM [id number]. Topic: General - Call Back - No Documentation >> Apr 14, 2024  3:44 PM Crispin Dolphin wrote: Reason for CRM: Patient called. States she needs to speak to someone directly in office. States she received a message from provider. Asked if it was for results and she said no she can see her on results. Does not want to provider more info and does not want to leave a message. She just needs to speak with someone in office. Thank You

## 2024-04-15 NOTE — Telephone Encounter (Signed)
 Pt is stating that she wanted the provider to give her a call. Pt states that she wanted to speak about medication" mounjaro ". Pt just wants provider to reach back out [phone]

## 2024-04-22 ENCOUNTER — Other Ambulatory Visit: Payer: Self-pay

## 2024-04-25 ENCOUNTER — Other Ambulatory Visit: Payer: Self-pay

## 2024-05-07 ENCOUNTER — Ambulatory Visit (INDEPENDENT_AMBULATORY_CARE_PROVIDER_SITE_OTHER): Admitting: Primary Care

## 2024-05-12 ENCOUNTER — Other Ambulatory Visit: Payer: Self-pay

## 2024-05-21 ENCOUNTER — Encounter (INDEPENDENT_AMBULATORY_CARE_PROVIDER_SITE_OTHER): Payer: Self-pay

## 2024-05-21 ENCOUNTER — Ambulatory Visit (INDEPENDENT_AMBULATORY_CARE_PROVIDER_SITE_OTHER): Payer: Self-pay | Admitting: Primary Care

## 2024-05-22 ENCOUNTER — Telehealth (INDEPENDENT_AMBULATORY_CARE_PROVIDER_SITE_OTHER): Payer: Self-pay

## 2024-05-22 NOTE — Telephone Encounter (Signed)
 Pt was schedule as a 4:10 02/20/24 for a 1 month f/u for starting mounjaro . Pt states she was not able to get medication and since she hasn't been able to get it she has gained a total of 20 lbs.   Pt first weighed 267.6lbs on 04/07/24 and now pt is weight 283.3lbs. Since pt was not able to start mounjaro  went and spoke with provider.  Per provider we are not going to bill her today cancel appt give wegovy  and have her come back in 4 weeks to have a weight check.   Pt is scheduled for a f/u on 05/20/24

## 2024-06-20 ENCOUNTER — Ambulatory Visit (INDEPENDENT_AMBULATORY_CARE_PROVIDER_SITE_OTHER): Payer: Self-pay | Admitting: Primary Care

## 2024-06-20 ENCOUNTER — Encounter (INDEPENDENT_AMBULATORY_CARE_PROVIDER_SITE_OTHER): Payer: Self-pay | Admitting: Primary Care

## 2024-06-20 DIAGNOSIS — Z6838 Body mass index (BMI) 38.0-38.9, adult: Secondary | ICD-10-CM

## 2024-06-20 DIAGNOSIS — Z7689 Persons encountering health services in other specified circumstances: Secondary | ICD-10-CM

## 2024-06-20 NOTE — Progress Notes (Unsigned)
           [facility]   Morgan Molina, is a 48 y.o. female presents for a follow up after starting on wegovy  for weight loss for {NUMBERS 0-12:18577} months. Patient states they have {DIET GOALS:20014}. While on the {phentermine, Qsym they have lost {NUMBERS 0-12:18577} lbs since last visit. They deny Allergic reactions--skin rash, itching, hives, swelling of the face, lips, tongue, or throat, Change in vision Dehydration--increased thirst, dry mouth, feeling faint or lightheaded, headache, dark yellow or brown urine Fast or irregular heartbeat Gallbladder problems--severe stomach pain, nausea, vomiting, fever Kidney injury--decrease in the amount of urine, swelling of the ankles, hands, or feet Pancreatitis--severe stomach pain that spreads to your back or gets worse after eating or when touched, fever, nausea, vomiting Thoughts of suicide or self-harm, worsening mood, feelings of depression Thyroid cancer--new mass or lump in the neck, pain or trouble swallowing, trouble breathing, hoarseness  BP Readings from Last 3 Encounters:  06/20/24 125/78  04/07/24 126/70  12/14/23 122/80    Wt Readings from Last 3 Encounters:  06/20/24 281 lb (127.5 kg)  04/07/24 267 lb 9.6 oz (121.4 kg)  12/14/23 269 lb 9.6 oz (122.3 kg)    Typical breakfast: Typical lunch:  Typical dinner:  Medications: Current Outpatient Medications on File Prior to Visit  Medication Sig Dispense Refill   alclomethasone (ACLOVATE ) 0.05 % cream Apply topically 2 (two) times daily. 30 g 0   clonazePAM  (KLONOPIN ) 1 MG tablet Take 1 tablet (1 mg total) by mouth 2 (two) times daily as needed for anxiety. 60 tablet 1   ibuprofen  (ADVIL ) 800 MG tablet Take 1 tablet (800 mg total) by mouth every 8 (eight) hours as needed for moderate pain (pain score 4-6). for pain 90 tablet 1   irbesartan -hydrochlorothiazide  (AVALIDE) 150-12.5 MG tablet Take 1 tablet by mouth daily. 90 tablet 1   ketoconazole   2%-triamcinolone  0.1% 1:2 cream mixture Apply topically 2 (two) times daily as needed. 45 g 3   tirzepatide  (MOUNJARO ) 2.5 MG/0.5ML Pen Inject 2.5 mg into the skin once a week. (Patient not taking: Reported on 06/20/2024) 2 mL 1   No current facility-administered medications on file prior to visit.    ROS:   Denies any headaches, blurred vision, fatigue, shortness of breath, chest pain, abdominal pain, abnormal vaginal discharge/itching/odor/irritation, problems with periods, bowel movements, urination, or intercourse unless otherwise stated above.  Physical exam:  Vitals:   06/20/24 0839  BP: 125/78  Pulse: 84  SpO2: 100%    {Exam; complete female:17872} {female exam, choose systems:17926}  Assessment and Plan: Obesity with co morbid conditions.  {plan; obesity:18780}   This note has been created with Education officer, environmental. Any transcriptional errors are unintentional.   Morgan SHAUNNA Bohr, NP 06/20/2024, 8:54 AM

## 2024-06-23 ENCOUNTER — Encounter (HOSPITAL_COMMUNITY): Payer: Self-pay | Admitting: *Deleted

## 2024-06-23 ENCOUNTER — Other Ambulatory Visit (INDEPENDENT_AMBULATORY_CARE_PROVIDER_SITE_OTHER): Payer: Self-pay | Admitting: Primary Care

## 2024-06-23 ENCOUNTER — Encounter (INDEPENDENT_AMBULATORY_CARE_PROVIDER_SITE_OTHER): Payer: Self-pay | Admitting: *Deleted

## 2024-06-23 ENCOUNTER — Ambulatory Visit (HOSPITAL_COMMUNITY)
Admission: EM | Admit: 2024-06-23 | Discharge: 2024-06-23 | Disposition: A | Discharge status: Home / Self Care | Attending: Internal Medicine | Admitting: Internal Medicine

## 2024-06-23 ENCOUNTER — Other Ambulatory Visit: Payer: Self-pay

## 2024-06-23 DIAGNOSIS — Z77098 Contact with and (suspected) exposure to other hazardous, chiefly nonmedicinal, chemicals: Secondary | ICD-10-CM

## 2024-06-23 DIAGNOSIS — R7303 Prediabetes: Secondary | ICD-10-CM

## 2024-06-23 MED ORDER — ERYTHROMYCIN 5 MG/GM OP OINT: TOPICAL_OINTMENT | OPHTHALMIC | 0 refills | Status: AC

## 2024-06-23 MED ORDER — EYE WASH OP SOLN: 2.0000 [drp] | Freq: Once | OPHTHALMIC | Status: DC

## 2024-06-23 MED ORDER — EYE WASH OP SOLN
OPHTHALMIC | Status: AC
Start: 2024-06-23 — End: 2024-06-23
  Filled 2024-06-23: qty 118

## 2024-06-23 MED ORDER — FLUORESCEIN SODIUM 1 MG OP STRP
ORAL_STRIP | OPHTHALMIC | Status: AC
  Filled 2024-06-23: qty 1

## 2024-06-23 MED ORDER — TIRZEPATIDE 2.5 MG/0.5ML ~~LOC~~ SOAJ: 2.5000 mg | SUBCUTANEOUS | 1 refills | Status: AC

## 2024-06-23 MED ORDER — PREDNISOLONE ACETATE 1 % OP SUSP: 1.0000 [drp] | Freq: Four times a day (QID) | OPHTHALMIC | 0 refills | Status: AC

## 2024-06-23 NOTE — ED Triage Notes (Signed)
 PT reports she had a period this month .

## 2024-06-23 NOTE — ED Provider Notes (Signed)
 [facility]    CSN: [id number] Arrival date & time: 06/23/24  1613      History   Chief Complaint Chief Complaint  Patient presents with   clorox in eyes    HPI Morgan Molina is a 48 y.o. female.   48 year old female presents urgent care with complaints of bilateral eye pain, redness and drainage.  This happened when one of her grandchildren accidentally sprayed Clorox and sanitizer into her eyes.  The right eye is much worse than the left.  She reports that at that time she rinsed her eyes out with saline for at least 20 to 30 minutes.  She has been using eyedrops since then.  She reports the area is swelling and she continues to have a lot of irritation.  She has some photophobia as well.  She reports her vision is somewhat blurry.  She does wear contacts but did not have them in at the time and is not currently wearing them.     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   Arthralgia 03/30/2021   Hyperglycemia 03/30/2021   Shortness of breath 03/30/2021   Bilateral chronic serous otitis media 08/15/2020   Eustachian tube dysfunction, bilateral 08/15/2020   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   Ganglion cyst of dorsum of left wrist 10/01/2019   Decreased visual acuity 10/01/2019   PTSD (post-traumatic stress disorder) 09/28/2019   Nipple discharge 04/02/2019   Panic attacks 02/19/2019   Bilateral low back pain  02/19/2019   Seasonal allergies 02/19/2019   Anxiety and depression 01/14/2019   Syncope 01/14/2019   Breast mass, right 01/14/2019   Hypertension     Past Surgical History:  Procedure Laterality Date   ANTERIOR CERVICAL DECOMP/DISCECTOMY FUSION N/A 10/04/2022   Procedure: CERVICAL THREE-FOUR, CERVICAL FOUR-FIVE, CERVICAL FIVE-SIX, CERVICAL SIX-SEVEN, ANTERIOR CERVICAL DISCECTOMY FUSION, ALLOGRAFT, PLATE;  Surgeon: Barbarann Oneil BROCKS, MD;  Location: [facility];  Service: Orthopedics;  Laterality: N/A;   BREAST EXCISIONAL BIOPSY Right    CARPAL TUNNEL RELEASE Left 02/20/2020   Procedure: LEFT CARPAL TUNNEL RELEASE;  Surgeon: Murrell Drivers, MD;  Location: [facility];  Service: Orthopedics;  Laterality: Left;   CARPAL TUNNEL RELEASE Right 02/28/2021   Procedure: CARPAL TUNNEL RELEASE;  Surgeon: Murrell Drivers, MD;  Location: [facility];  Service: Orthopedics;  Laterality: Right;   CHOLECYSTECTOMY     GANGLION CYST EXCISION Left 02/20/2020   Procedure: LEFT DORSAL GANGLION EXCISION;  Surgeon: Murrell Drivers, MD;  Location: [facility];  Service: Orthopedics;  Laterality: Left;   TRIGGER FINGER RELEASE Bilateral 08/29/2021   Procedure: RELEASE TRIGGER FINGER/A-1 PULLEY BILATERAL THUMBS;  Surgeon: Murrell Drivers, MD;  Location: [facility];  Service: Orthopedics;  Laterality: Bilateral;  30 MIN   TUBAL LIGATION      OB History   No obstetric history on  file.      Home Medications    Prior to Admission medications   Medication Sig Start Date End Date Taking? Authorizing Provider  alclomethasone (ACLOVATE ) 0.05 % cream Apply topically 2 (two) times daily. 10/24/23  Yes Celestia Rosaline SQUIBB, NP  clonazePAM  (KLONOPIN ) 1 MG tablet Take 1 tablet (1 mg total) by mouth 2 (two) times daily as needed for anxiety. 12/14/23  Yes Celestia Rosaline SQUIBB, NP  erythromycin  ophthalmic ointment Place a 1/2 inch ribbon of ointment into the lower eyelid. 06/23/24  Yes Ervin Hensley,  Kevionna Heffler A, PA-C  ibuprofen  (ADVIL ) 800 MG tablet Take 1 tablet (800 mg total) by mouth every 8 (eight) hours as needed for moderate pain (pain score 4-6). for pain 04/07/24  Yes Celestia Rosaline SQUIBB, NP  irbesartan -hydrochlorothiazide  (AVALIDE) 150-12.5 MG tablet Take 1 tablet by mouth daily. 12/14/23  Yes Celestia Rosaline SQUIBB, NP  ketoconazole  2%-triamcinolone  0.1% 1:2 cream mixture Apply topically 2 (two) times daily as needed. 10/24/23  Yes Celestia Rosaline SQUIBB, NP  prednisoLONE  acetate (PRED FORTE ) 1 % ophthalmic suspension Place 1 drop into both eyes 4 (four) times daily. 06/23/24  Yes Maylee Bare A, PA-C  tirzepatide  (MOUNJARO ) 2.5 MG/0.5ML Pen Inject 2.5 mg into the skin once a week. Patient not taking: Reported on 06/20/2024 04/07/24   Celestia Rosaline SQUIBB, NP    Family History Family History  Problem Relation Age of Onset   Cancer Mother    Hypertension Mother    Heart attack Mother    Breast cancer Mother 29   CAD Father    Hypertension Father    Diabetes Father     Social History Social History   Tobacco Use   Smoking status: Former    Current packs/day: 0.00    Types: Cigarettes    Quit date: 10/26/2019    Years since quitting: 4.6   Smokeless tobacco: Never  Vaping Use   Vaping status: Never Used  Substance Use Topics   Alcohol use: Yes    Comment: occasionally   Drug use: Yes    Types: Marijuana    Comment: occasionally     Allergies   Latex, Lisinopril, Tramadol , and Adhesive [tape]   Review of Systems Review of Systems  Constitutional:  Negative for chills and fever.  HENT:  Negative for ear pain and sore throat.   Eyes:  Positive for photophobia, pain, discharge, redness and visual disturbance.  Respiratory:  Negative for cough and shortness of breath.   Cardiovascular:  Negative for chest pain and palpitations.  Gastrointestinal:  Negative for abdominal pain and vomiting.  Genitourinary:  Negative for dysuria and hematuria.  Musculoskeletal:   Negative for arthralgias and back pain.  Skin:  Negative for color change and rash.  Neurological:  Negative for seizures and syncope.  All other systems reviewed and are negative.    Physical Exam Triage Vital Signs ED Triage Vitals  Encounter Vitals Group     BP 06/23/24 1721 123/78     Girls Systolic BP Percentile --      Girls Diastolic BP Percentile --      Boys Systolic BP Percentile --      Boys Diastolic BP Percentile --      Pulse Rate 06/23/24 1721 71     Resp 06/23/24 1721 18     Temp 06/23/24 1721 98.2 F (36.8 C)     Temp src --      SpO2 06/23/24 1721 97 %     Weight --  Height --      Head Circumference --      Peak Flow --      Pain Score 06/23/24 1718 8     Pain Loc --      Pain Education --      Exclude from Growth Chart --    No data found.  Updated Vital Signs BP 123/78   Pulse 71   Temp 98.2 F (36.8 C)   Resp 18   LMP  (LMP Unknown)   SpO2 97%   Visual Acuity Right Eye Distance: 20/70 Left Eye Distance: 20/70 Bilateral Distance: 20/70  Right Eye Near:   Left Eye Near:    Bilateral Near:     Physical Exam Vitals and nursing note reviewed.  Constitutional:      General: She is not in acute distress.    Appearance: She is well-developed.  HENT:     Head: Normocephalic and atraumatic.  Eyes:     General:        Right eye: Discharge present.        Left eye: Discharge present.    Conjunctiva/sclera:     Right eye: Right conjunctiva is injected.     Left eye: Left conjunctiva is injected.     Comments: Surrounding erythema, mild swelling present. Mild decrease in visual acuity.   Cardiovascular:     Rate and Rhythm: Normal rate.  Pulmonary:     Effort: Pulmonary effort is normal. No respiratory distress.  Musculoskeletal:        General: No swelling.     Cervical back: Neck supple.  Skin:    General: Skin is warm and dry.     Capillary Refill: Capillary refill takes less than 2 seconds.  Neurological:     Mental Status:  She is alert.  Psychiatric:        Mood and Affect: Mood normal.      UC Treatments / Results  Labs (all labs ordered are listed, but only abnormal results are displayed) Labs Reviewed - No data to display  EKG   Radiology No results found.  Procedures Procedures (including critical care time)  Medications Ordered in UC Medications - No data to display   Initial Impression / Assessment and Plan / UC Course  I have reviewed the triage vital signs and the nursing notes.  Pertinent labs & imaging results that were available during my care of the patient were reviewed by me and considered in my medical decision making (see chart for details).     Chemical exposure of eye   Bleach exposure to both eyes. Fluorescin staining today did not show any corneal abrasion or ulcer. Can treat with the following:  Prednisolone  (Pred-forte) 1 drop in each eye 4 times daily.  Erythromycin  ointment Place a 1/2 inch ribbon of ointment into the lower eyelid of both eyes nightly for 7 days. Ok to use in the AM as well.  Schedule an appointment to see your ophthalmologist to recheck the eye especially if the vision is not improving.  Return to urgent care or PCP if symptoms worsen or fail to resolve.    Final Clinical Impressions(s) / UC Diagnoses   Final diagnoses:  Chemical exposure of eye     Discharge Instructions      Bleach exposure to both eyes. Fluorescin staining today did not show any corneal abrasion or ulcer. Can treat with the following:  Prednisolone  (Pred-forte) 1 drop in each eye 4 times daily.  Erythromycin   ointment Place a 1/2 inch ribbon of ointment into the lower eyelid of both eyes nightly for 7 days. Ok to use in the AM as well.  Schedule an appointment to see your ophthalmologist to recheck the eye especially if the vision is not improving.  Return to urgent care or PCP if symptoms worsen or fail to resolve.      ED Prescriptions     Medication Sig Dispense  Auth. Provider   prednisoLONE  acetate (PRED FORTE ) 1 % ophthalmic suspension Place 1 drop into both eyes 4 (four) times daily. 5 mL Aydia Maj A, PA-C   erythromycin  ophthalmic ointment Place a 1/2 inch ribbon of ointment into the lower eyelid. 3.5 g Teresa Almarie LABOR, [place]      PDMP not reviewed this encounter.   Teresa Almarie LABOR, [place] 06/23/24 1836

## 2024-06-23 NOTE — ED Triage Notes (Addendum)
 PT reports splashing clorox in bil eyes . More splashed in RT eye than Lt eye. Py has burning in eyes and vision blurred in both eyes mor inRt than LT. Pain 8/10. PT rinsed eyes last night with water and saline.

## 2024-06-23 NOTE — Discharge Instructions (Addendum)
 Bleach exposure to both eyes. Fluorescin staining today did not show any corneal abrasion or ulcer. Can treat with the following:  Prednisolone  (Pred-forte) 1 drop in each eye 4 times daily.  Erythromycin  ointment Place a 1/2 inch ribbon of ointment into the lower eyelid of both eyes nightly for 7 days. Ok to use in the AM as well.  Schedule an appointment to see your ophthalmologist to recheck the eye especially if the vision is not improving.  Return to urgent care or PCP if symptoms worsen or fail to resolve.

## 2024-06-25 ENCOUNTER — Other Ambulatory Visit: Payer: Self-pay

## 2024-06-26 ENCOUNTER — Other Ambulatory Visit: Payer: Self-pay

## 2024-06-30 ENCOUNTER — Other Ambulatory Visit: Payer: Self-pay

## 2024-07-03 ENCOUNTER — Encounter (INDEPENDENT_AMBULATORY_CARE_PROVIDER_SITE_OTHER): Payer: Self-pay | Admitting: Primary Care

## 2024-07-03 ENCOUNTER — Ambulatory Visit (INDEPENDENT_AMBULATORY_CARE_PROVIDER_SITE_OTHER): Payer: Self-pay | Admitting: Primary Care

## 2024-07-03 VITALS — BP 105/72 | HR 85 | Resp 20 | Ht 64.0 in | Wt 281.6 lb

## 2024-07-03 DIAGNOSIS — M5441 Lumbago with sciatica, right side: Secondary | ICD-10-CM

## 2024-07-03 DIAGNOSIS — I10 Essential (primary) hypertension: Secondary | ICD-10-CM

## 2024-07-03 DIAGNOSIS — Z0000 Encounter for general adult medical examination without abnormal findings: Secondary | ICD-10-CM

## 2024-07-03 DIAGNOSIS — M5442 Lumbago with sciatica, left side: Secondary | ICD-10-CM

## 2024-07-03 DIAGNOSIS — M2550 Pain in unspecified joint: Secondary | ICD-10-CM

## 2024-07-03 DIAGNOSIS — Z760 Encounter for issue of repeat prescription: Secondary | ICD-10-CM

## 2024-07-03 DIAGNOSIS — G8929 Other chronic pain: Secondary | ICD-10-CM

## 2024-07-03 MED ORDER — IBUPROFEN 800 MG PO TABS
800.0000 mg | ORAL_TABLET | Freq: Three times a day (TID) | ORAL | 1 refills | Status: AC | PRN
Start: 2024-07-03 — End: ?

## 2024-07-03 MED ORDER — IRBESARTAN-HYDROCHLOROTHIAZIDE 150-12.5 MG PO TABS
1.0000 | ORAL_TABLET | Freq: Every day | ORAL | 1 refills | Status: AC
Start: 2024-07-03 — End: ?

## 2024-07-03 NOTE — Progress Notes (Signed)
 [facility]  Morgan Molina is a 47 y.o. female presents to office today for annual physical exam examination.    Concerns today include: None  Occupation: Social service , Marital status: S, Substance use: N Diet: low carb , low sodium , low sodium Exercise: yes  Health Maintenance  Topic Date Due   Cervical Cancer Screening (HPV/Pap Cotest)  Never done   COVID-19 Vaccine (1 - 2024-25 season) Never done   DTaP/Tdap/Td (6 - Tdap) 09/13/2024 (Originally 12/01/2007)   Pneumococcal Vaccine (1 of 2 - PCV) 12/13/2024 (Originally 01/19/1995)   INFLUENZA VACCINE  02/03/2025 (Originally 06/06/2024)   Hepatitis B Vaccines 19-59 Average Risk (1 of 3 - 19+ 3-dose series) 07/03/2025 (Originally 01/19/1995)   Colonoscopy  04/25/2033   Hepatitis C Screening  Completed   HIV Screening  Completed   HPV VACCINES  Aged Out   Meningococcal B Vaccine  Aged Out   Past Medical History:  Diagnosis Date   Anemia    Anxiety    Arthritis    Depression    Dyspnea    Headache    Hypertension    Muscle spasm    PTSD (post-traumatic stress disorder) 09/28/2019   Syncope    Social History   Socioeconomic History   Marital status: Married    Spouse name: Not on file   Number of children: 5   Years of education: some college   Highest education level: Not on file  Occupational History   Occupation: Customer Service  Tobacco Use   Smoking status: Former    Current packs/day: 0.00    Types: Cigarettes    Quit date: 10/26/2019    Years since quitting: 4.6   Smokeless tobacco: Never  Vaping Use   Vaping status: Never Used  Substance and Sexual Activity   Alcohol use: Yes    Comment: occasionally   Drug use: Yes    Types: Marijuana    Comment: occasionally   Sexual activity: Yes    Birth control/protection: Surgical  Other Topics Concern   Not on file  Social History Narrative   Lives at home with family.   Right-handed.   No caffeine.   Social Drivers of Health    Financial Resource Strain: High Risk (09/14/2023)   Overall Financial Resource Strain (CARDIA)    Difficulty of Paying Living Expenses: Very hard  Food Insecurity: Food Insecurity Present (09/14/2023)   Hunger Vital Sign    Worried About Running Out of Food in the Last Year: Sometimes true    Ran Out of Food in the Last Year: Sometimes true  Transportation Needs: Unmet Transportation Needs (09/14/2023)   PRAPARE - Administrator, Civil Service (Medical): Yes    Lack of Transportation (Non-Medical): Yes  Physical Activity: Insufficiently Active (09/14/2023)   Exercise Vital Sign    Days of Exercise per Week: 3 days    Minutes of Exercise per Session: 30 min  Stress: Stress Concern Present (09/14/2023)   [employer] of Occupational Health - Occupational Stress Questionnaire    Feeling of Stress : Very much  Social Connections: Unknown (09/14/2023)   Social Connection and Isolation Panel    Frequency of Communication with Friends and Family: Never    Frequency of Social Gatherings with Friends and Family: Never    Attends Religious Services: More than 4 times per year    Active Member of [employer] or Organizations: Yes    Attends Banker Meetings: More than 4 times per  year    Marital Status: Not on file  Intimate Partner Violence: Not At Risk (09/14/2023)   Humiliation, Afraid, Rape, and Kick questionnaire    Fear of Current or Ex-Partner: No    Emotionally Abused: No    Physically Abused: No    Sexually Abused: No   Past Surgical History:  Procedure Laterality Date   ANTERIOR CERVICAL DECOMP/DISCECTOMY FUSION N/A 10/04/2022   Procedure: CERVICAL THREE-FOUR, CERVICAL FOUR-FIVE, CERVICAL FIVE-SIX, CERVICAL SIX-SEVEN, ANTERIOR CERVICAL DISCECTOMY FUSION, ALLOGRAFT, PLATE;  Surgeon: Barbarann Oneil BROCKS, MD;  Location: [facility] OR;  Service: Orthopedics;  Laterality: N/A;   BREAST EXCISIONAL BIOPSY Right    CARPAL TUNNEL RELEASE Left 02/20/2020   Procedure: LEFT CARPAL  TUNNEL RELEASE;  Surgeon: Murrell Drivers, MD;  Location: [facility];  Service: Orthopedics;  Laterality: Left;   CARPAL TUNNEL RELEASE Right 02/28/2021   Procedure: CARPAL TUNNEL RELEASE;  Surgeon: Murrell Drivers, MD;  Location: [facility];  Service: Orthopedics;  Laterality: Right;   CHOLECYSTECTOMY     GANGLION CYST EXCISION Left 02/20/2020   Procedure: LEFT DORSAL GANGLION EXCISION;  Surgeon: Murrell Drivers, MD;  Location: [facility];  Service: Orthopedics;  Laterality: Left;   TRIGGER FINGER RELEASE Bilateral 08/29/2021   Procedure: RELEASE TRIGGER FINGER/A-1 PULLEY BILATERAL THUMBS;  Surgeon: Murrell Drivers, MD;  Location: [facility];  Service: Orthopedics;  Laterality: Bilateral;  30 MIN   TUBAL LIGATION     Family History  Problem Relation Age of Onset   Cancer Mother    Hypertension Mother    Heart attack Mother    Breast cancer Mother 82   CAD Father    Hypertension Father    Diabetes Father     Current Outpatient Medications:    alclomethasone (ACLOVATE ) 0.05 % cream, Apply topically 2 (two) times daily., Disp: 30 g, Rfl: 0   clonazePAM  (KLONOPIN ) 1 MG tablet, Take 1 tablet (1 mg total) by mouth 2 (two) times daily as needed for anxiety., Disp: 60 tablet, Rfl: 1   erythromycin  ophthalmic ointment, Place a 1/2 inch ribbon of ointment into the lower eyelid., Disp: 3.5 g, Rfl: 0   ibuprofen  (ADVIL ) 800 MG tablet, Take 1 tablet (800 mg total) by mouth every 8 (eight) hours as needed for moderate pain (pain score 4-6). for pain, Disp: 90 tablet, Rfl: 1   irbesartan -hydrochlorothiazide  (AVALIDE) 150-12.5 MG tablet, Take 1 tablet by mouth daily., Disp: 90 tablet, Rfl: 1   ketoconazole  2%-triamcinolone  0.1% 1:2 cream mixture, Apply topically 2 (two) times daily as needed., Disp: 45 g, Rfl: 3   prednisoLONE  acetate (PRED FORTE ) 1 % ophthalmic suspension, Place 1 drop into both eyes 4 (four) times daily., Disp: 5 mL, Rfl: 0   tirzepatide   (MOUNJARO ) 2.5 MG/0.5ML Pen, Inject 2.5 mg into the skin once a week., Disp: 2 mL, Rfl: 1 Outpatient Encounter Medications as of 07/03/2024  Medication Sig   alclomethasone (ACLOVATE ) 0.05 % cream Apply topically 2 (two) times daily.   clonazePAM  (KLONOPIN ) 1 MG tablet Take 1 tablet (1 mg total) by mouth 2 (two) times daily as needed for anxiety.   erythromycin  ophthalmic ointment Place a 1/2 inch ribbon of ointment into the lower eyelid.   ibuprofen  (ADVIL ) 800 MG tablet Take 1 tablet (800 mg total) by mouth every 8 (eight) hours as needed for moderate pain (pain score 4-6). for pain   irbesartan -hydrochlorothiazide  (AVALIDE) 150-12.5 MG tablet Take 1 tablet by mouth daily.   ketoconazole  2%-triamcinolone  0.1% 1:2 cream  mixture Apply topically 2 (two) times daily as needed.   prednisoLONE  acetate (PRED FORTE ) 1 % ophthalmic suspension Place 1 drop into both eyes 4 (four) times daily.   tirzepatide  (MOUNJARO ) 2.5 MG/0.5ML Pen Inject 2.5 mg into the skin once a week.   No facility-administered encounter medications on file as of 07/03/2024.    Allergies  Allergen Reactions   Latex Dermatitis   Lisinopril Cough   Tramadol  Nausea And Vomiting    ALL   Adhesive [Tape] Rash   Other Dermatitis     ROS: Review of Systems A comprehensive review of systems was negative.    Physical exam: General: Vital signs reviewed.  Patient is well-developed and well-nourished, obese in no acute distress and cooperative with exam. Head: Normocephalic and atraumatic. Eyes: EOMI, conjunctivae normal, no scleral icterus. Neck: Supple, trachea midline, normal ROM, no JVD, masses, thyromegaly, or carotid bruit present. Cardiovascular: RRR, S1 normal, S2 normal, no murmurs, gallops, or rubs. Pulmonary/Chest: Clear to auscultation bilaterally, no wheezes, rales, or rhonchi. Abdominal: Soft, non-tender, non-distended, BS +, no masses, organomegaly, or guarding present. Musculoskeletal: No joint deformities,  erythema, or stiffness, ROM full and nontender. Extremities: No lower extremity edema bilaterally,  pulses symmetric and intact bilaterally. No cyanosis or clubbing. Neurological: A&O x3, Strength is normal Skin: Warm, dry and intact. No rashes or erythema. Psychiatric: Normal mood and affect. speech and behavior is normal. Cognition and memory are normal.      Assessment/ Plan: Morgan Molina here for annual physical exam.  Morgan Molina was seen today for annual exam.  Diagnoses and all orders for this visit:  Annual physical exam  Medication refill -     irbesartan -hydrochlorothiazide  (AVALIDE) 150-12.5 MG tablet; Take 1 tablet by mouth daily. -     ibuprofen  (ADVIL ) 800 MG tablet; Take 1 tablet (800 mg total) by mouth every 8 (eight) hours as needed for moderate pain (pain score 4-6). for pain  Essential hypertension Well control  DIET: Limit salt intake, read nutrition labels to check salt content, limit fried and high fatty foods  Avoid using multisymptom OTC cold preparations that generally contain sudafed which can rise BP. Consult with pharmacist on best cold relief products to use for persons with HTN EXERCISE Discussed incorporating exercise such as walking - 30 minutes most days of the week and can do in 10 minute intervals    -     irbesartan -hydrochlorothiazide  (AVALIDE) 150-12.5 MG tablet; Take 1 tablet by mouth daily.  Chronic bilateral low back pain with bilateral sciatica -     ibuprofen  (ADVIL ) 800 MG tablet; Take 1 tablet (800 mg total) by mouth every 8 (eight) hours as needed for moderate pain (pain score 4-6). for pain  Arthralgia, unspecified joint -     ibuprofen  (ADVIL ) 800 MG tablet; Take 1 tablet (800 mg total) by mouth every 8 (eight) hours as needed for moderate pain (pain score 4-6). for pain  Counseled on healthy lifestyle choices, including diet (rich in fruits, vegetables and lean meats and low in salt and simple carbohydrates) and exercise (at least 30  minutes of moderate physical activity daily).  Patient to follow up in 1 year for annual exam or sooner if needed.  The above assessment and management plan was discussed with the patient. The patient verbalized understanding of and has agreed to the management plan. Patient is aware to call the clinic if symptoms persist or worsen. Patient is aware when to return to the clinic for a follow-up visit. Patient  educated on when it is appropriate to go to the emergency department.   This note has been created with Education officer, environmental. Any transcriptional errors are unintentional.   Morgan SHAUNNA Bohr, NP 07/03/2024, 10:14 AM

## 2024-09-08 ENCOUNTER — Encounter: Payer: Self-pay | Admitting: Radiology
